# Patient Record
Sex: Female | Born: 1935
Health system: Southern US, Community
[De-identification: ages and names within clinical notes are randomized; demographics above are authoritative.]

## PROBLEM LIST (undated history)

## (undated) DIAGNOSIS — F32A Depression, unspecified: Secondary | ICD-10-CM

## (undated) DIAGNOSIS — E785 Hyperlipidemia, unspecified: Secondary | ICD-10-CM

## (undated) DIAGNOSIS — E039 Hypothyroidism, unspecified: Secondary | ICD-10-CM

## (undated) DIAGNOSIS — I350 Nonrheumatic aortic (valve) stenosis: Secondary | ICD-10-CM

## (undated) DIAGNOSIS — K5790 Diverticulosis of intestine, part unspecified, without perforation or abscess without bleeding: Secondary | ICD-10-CM

## (undated) DIAGNOSIS — M8580 Other specified disorders of bone density and structure, unspecified site: Secondary | ICD-10-CM

## (undated) DIAGNOSIS — I471 Supraventricular tachycardia, unspecified: Secondary | ICD-10-CM

## (undated) DIAGNOSIS — M810 Age-related osteoporosis without current pathological fracture: Secondary | ICD-10-CM

## (undated) DIAGNOSIS — R011 Cardiac murmur, unspecified: Secondary | ICD-10-CM

## (undated) DIAGNOSIS — D126 Benign neoplasm of colon, unspecified: Secondary | ICD-10-CM

## (undated) DIAGNOSIS — F329 Major depressive disorder, single episode, unspecified: Secondary | ICD-10-CM

## (undated) DIAGNOSIS — I10 Essential (primary) hypertension: Secondary | ICD-10-CM

## (undated) DIAGNOSIS — E7800 Pure hypercholesterolemia, unspecified: Secondary | ICD-10-CM

## (undated) HISTORY — DX: Hypothyroidism, unspecified: E03.9

## (undated) HISTORY — DX: Pure hypercholesterolemia, unspecified: E78.00

## (undated) HISTORY — DX: Depression, unspecified: F32.A

## (undated) HISTORY — DX: Hyperlipidemia, unspecified: E78.5

## (undated) HISTORY — DX: Essential (primary) hypertension: I10

## (undated) HISTORY — DX: Other specified disorders of bone density and structure, unspecified site: M85.80

## (undated) HISTORY — DX: Benign neoplasm of colon, unspecified: D12.6

## (undated) HISTORY — DX: Age-related osteoporosis without current pathological fracture: M81.0

## (undated) HISTORY — DX: Nonrheumatic aortic (valve) stenosis: I35.0

## (undated) HISTORY — PX: COLONOSCOPY: SHX174

## (undated) HISTORY — PX: TONSILLECTOMY: SUR1361

## (undated) HISTORY — DX: Cardiac murmur, unspecified: R01.1

## (undated) HISTORY — PX: DILATION AND CURETTAGE OF UTERUS: SHX78

## (undated) HISTORY — DX: Diverticulosis of intestine, part unspecified, without perforation or abscess without bleeding: K57.90

## (undated) HISTORY — DX: Major depressive disorder, single episode, unspecified: F32.9

---
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
## 1994-11-18 HISTORY — PX: CERVICAL BIOPSY  W/ LOOP ELECTRODE EXCISION: SUR135

## 1999-01-02 ENCOUNTER — Other Ambulatory Visit: Admission: RE | Admit: 1999-01-02 | Discharge: 1999-01-02 | Payer: Self-pay | Admitting: Obstetrics and Gynecology

## 2000-01-07 ENCOUNTER — Other Ambulatory Visit: Admission: RE | Admit: 2000-01-07 | Discharge: 2000-01-07 | Payer: Self-pay | Admitting: Obstetrics and Gynecology

## 2001-01-21 ENCOUNTER — Other Ambulatory Visit: Admission: RE | Admit: 2001-01-21 | Discharge: 2001-01-21 | Payer: Self-pay | Admitting: Obstetrics and Gynecology

## 2001-03-05 ENCOUNTER — Encounter: Payer: Self-pay | Admitting: Emergency Medicine

## 2001-03-05 ENCOUNTER — Emergency Department (HOSPITAL_COMMUNITY): Admission: EM | Admit: 2001-03-05 | Discharge: 2001-03-05 | Payer: Self-pay | Admitting: Emergency Medicine

## 2001-04-06 ENCOUNTER — Encounter (INDEPENDENT_AMBULATORY_CARE_PROVIDER_SITE_OTHER): Payer: Self-pay | Admitting: Specialist

## 2001-04-06 ENCOUNTER — Ambulatory Visit (HOSPITAL_COMMUNITY): Admission: RE | Admit: 2001-04-06 | Discharge: 2001-04-06 | Payer: Self-pay | Admitting: Gastroenterology

## 2002-01-22 ENCOUNTER — Other Ambulatory Visit: Admission: RE | Admit: 2002-01-22 | Discharge: 2002-01-22 | Payer: Self-pay | Admitting: Obstetrics and Gynecology

## 2003-03-02 ENCOUNTER — Encounter: Payer: Self-pay | Admitting: Internal Medicine

## 2003-03-02 ENCOUNTER — Encounter: Admission: RE | Admit: 2003-03-02 | Discharge: 2003-03-02 | Payer: Self-pay | Admitting: Internal Medicine

## 2003-03-03 ENCOUNTER — Other Ambulatory Visit: Admission: RE | Admit: 2003-03-03 | Discharge: 2003-03-03 | Payer: Self-pay | Admitting: Obstetrics and Gynecology

## 2004-04-27 ENCOUNTER — Encounter: Admission: RE | Admit: 2004-04-27 | Discharge: 2004-04-27 | Payer: Self-pay | Admitting: Internal Medicine

## 2004-06-06 ENCOUNTER — Other Ambulatory Visit: Admission: RE | Admit: 2004-06-06 | Discharge: 2004-06-06 | Payer: Self-pay | Admitting: Obstetrics and Gynecology

## 2005-05-03 ENCOUNTER — Encounter: Admission: RE | Admit: 2005-05-03 | Discharge: 2005-05-03 | Payer: Self-pay | Admitting: Internal Medicine

## 2005-11-19 ENCOUNTER — Other Ambulatory Visit: Admission: RE | Admit: 2005-11-19 | Discharge: 2005-11-19 | Payer: Self-pay | Admitting: Obstetrics and Gynecology

## 2007-06-17 ENCOUNTER — Encounter: Admission: RE | Admit: 2007-06-17 | Discharge: 2007-06-17 | Payer: Self-pay | Admitting: Internal Medicine

## 2008-01-12 ENCOUNTER — Encounter: Admission: RE | Admit: 2008-01-12 | Discharge: 2008-01-12 | Payer: Self-pay | Admitting: Psychiatry

## 2008-07-18 ENCOUNTER — Encounter: Admission: RE | Admit: 2008-07-18 | Discharge: 2008-07-18 | Payer: Self-pay | Admitting: Internal Medicine

## 2009-11-14 ENCOUNTER — Encounter: Admission: RE | Admit: 2009-11-14 | Discharge: 2009-11-14 | Payer: Self-pay | Admitting: Internal Medicine

## 2010-08-07 ENCOUNTER — Ambulatory Visit: Payer: Self-pay | Admitting: Cardiology

## 2010-08-07 ENCOUNTER — Ambulatory Visit (HOSPITAL_COMMUNITY): Admission: RE | Admit: 2010-08-07 | Discharge: 2010-08-07 | Payer: Self-pay | Admitting: Cardiology

## 2010-08-13 ENCOUNTER — Ambulatory Visit: Payer: Self-pay | Admitting: Cardiology

## 2010-12-11 ENCOUNTER — Other Ambulatory Visit: Payer: Self-pay | Admitting: Internal Medicine

## 2010-12-11 DIAGNOSIS — Z1239 Encounter for other screening for malignant neoplasm of breast: Secondary | ICD-10-CM

## 2010-12-22 ENCOUNTER — Encounter: Payer: Self-pay | Admitting: Cardiology

## 2011-01-01 ENCOUNTER — Ambulatory Visit
Admission: RE | Admit: 2011-01-01 | Discharge: 2011-01-01 | Disposition: A | Discharge status: Home / Self Care | Payer: BC Managed Care – PPO | Source: Ambulatory Visit | Attending: Internal Medicine | Admitting: Internal Medicine

## 2011-01-01 DIAGNOSIS — Z1239 Encounter for other screening for malignant neoplasm of breast: Secondary | ICD-10-CM

## 2011-02-11 ENCOUNTER — Encounter: Payer: Self-pay | Admitting: Cardiology

## 2011-02-11 ENCOUNTER — Ambulatory Visit (INDEPENDENT_AMBULATORY_CARE_PROVIDER_SITE_OTHER): Payer: Medicare Other | Admitting: Cardiology

## 2011-02-11 VITALS — BP 130/70 | HR 68 | Wt 172.0 lb

## 2011-02-11 DIAGNOSIS — I359 Nonrheumatic aortic valve disorder, unspecified: Secondary | ICD-10-CM

## 2011-02-11 DIAGNOSIS — I350 Nonrheumatic aortic (valve) stenosis: Secondary | ICD-10-CM

## 2011-02-11 DIAGNOSIS — E039 Hypothyroidism, unspecified: Secondary | ICD-10-CM

## 2011-02-11 DIAGNOSIS — E7800 Pure hypercholesterolemia, unspecified: Secondary | ICD-10-CM

## 2011-02-11 DIAGNOSIS — I10 Essential (primary) hypertension: Secondary | ICD-10-CM

## 2011-02-11 LAB — LIPID PANEL
HDL: 55 mg/dL (ref >39)
LDL Cholesterol: 97 mg/dL (ref 0–99)
Total CHOL/HDL Ratio: 3.5 Ratio
VLDL: 43 mg/dL — ABNORMAL HIGH (ref 0–40)

## 2011-02-11 LAB — COMPREHENSIVE METABOLIC PANEL
AST: 22 U/L (ref 0–37)
Albumin: 4.6 g/dL (ref 3.5–5.2)
Alkaline Phosphatase: 56 U/L (ref 39–117)
BUN: 19 mg/dL (ref 6–23)
Creat: 0.92 mg/dL (ref 0.40–1.20)
Potassium: 4 mEq/L (ref 3.5–5.3)
Total Bilirubin: 0.6 mg/dL (ref 0.3–1.2)

## 2011-02-11 NOTE — Assessment & Plan Note (Signed)
She has known mild aortic stenosis.  Her last echocardiogram was 08/07/10 which showed an aortic valve area of 1.46.  She's not having any angina pectoris or symptoms of congestive heart failure or exertional dizziness or syncope

## 2011-02-11 NOTE — Assessment & Plan Note (Signed)
The patient is not having any chest pain dizziness or syncope.  She denies any headaches or dizzy spells.  She's not having any adverse reaction from her blood pressure medication.  She will continue same medication.

## 2011-02-11 NOTE — Assessment & Plan Note (Signed)
She is attempting to stay on a low calorie low cholesterol diet.  Her weight is down 2 pounds since last visit.  She stays busy looking after her daughter who has metastatic breast cancer and so she herself does not get much regular intentional aerobic exercise.

## 2011-02-11 NOTE — Progress Notes (Signed)
History of Present Illness: This pleasant 77 year old Caucasian female is seen for a six-month followup office visit.  She does have a past history of essential hypertension and history of mild aortic stenosis.  In any new cardiovascular symptoms.  She has been under a lot of stress because her daughter who has metastatic breast cancer has been doing poorly.  Current Outpatient Prescriptions  Medication Sig Dispense Refill  . aspirin 81 MG tablet Take 81 mg by mouth every other day.        . bisoprolol (ZEBETA) 5 MG tablet Take 5 mg by mouth daily. TAKING ONE HALF DAILY      . Coenzyme Q10 (COQ10) 100 MG capsule Take 100 mg by mouth daily.        . Cyanocobalamin (B-12 PO) Take by mouth.        [name] ibuprofen (ADVIL) 200 MG tablet Take 400 mg by mouth every 6 (six) hours as needed.        [name] levothyroxine (SYNTHROID, LEVOTHROID) 88 MCG tablet Take 88 mcg by mouth daily.        . Loratadine (CLARITIN PO) Take by mouth as needed.        . multivitamin (THERAGRAN) per tablet Take 1 tablet by mouth daily.        . nitroGLYCERIN (NITRODUR) 0.4 mg/hr Place 1 patch onto the skin daily.        . Omeprazole (PRILOSEC PO) Take by mouth daily. TAKES PRN      . Pyridoxine HCl (B-6 PO) Take by mouth.        . triamterene-hydrochlorothiazide (MAXZIDE-25) 37.5-25 MG per tablet Take 1 tablet by mouth daily. 1/2 tab        . valsartan (DIOVAN) 80 MG tablet Take 80 mg by mouth daily. 1/2 tab       . zolpidem (AMBIEN) 10 MG tablet Take 10 mg by mouth at bedtime as needed. 1/2 tab       . Multiple Minerals-Vitamins (CITRACAL PLUS PO) Take by mouth.        . Omega-3 Fatty Acids (SALMON OIL PO) Take by mouth 2 (two) times daily.          Allergies  Allergen Reactions  . Actonel      GI ISSUES  . Altace Cough  . Bextra (Valdecoxib)   . Lipitor (Atorvastatin Calcium)     MYALGIAS  . Zocor (Simvastatin)     MYALGIAS  . Niaspan (Niacin (Antihyperlipidemic)) Rash    Patient Active Problem List  Diagnoses  .  Hypertension  . Hypercholesteremia  . Hypothyroid  . Aortic stenosis, mild    History  Smoking status  . Former Smoker  Smokeless tobacco  . Not on file    History  Alcohol Use     History reviewed. No pertinent family history.  Review of Systems: Constitutional: no fever chills diaphoresis or fatigue or change in weight.  Head and neck: no hearing loss, no epistaxis, no photophobia or visual disturbance. Respiratory: No cough, shortness of breath or wheezing. Cardiovascular: No chest pain peripheral edema, palpitations. Gastrointestinal: No abdominal distention, no abdominal pain, no change in bowel habits hematochezia or melena. Genitourinary: No dysuria, no frequency, no urgency, no nocturia. Musculoskeletal:No arthralgias, no back pain, no gait disturbance or myalgias. Neurological: No dizziness, no headaches, no numbness, no seizures, no syncope, no weakness, no tremors. Hematologic: No lymphadenopathy, no easy bruising. Psychiatric: No confusion, no hallucinations, no sleep disturbance.    Physical Exam: Filed Vitals:  02/11/11 1148  BP: 130/70  Pulse: 68   Her weight is 172, down 2 pounds.  The general appearance reveals a well-developed well-nourished woman in no distress.Pupils equal and reactive.   Extraocular Movements are full.  There is no scleral icterus.  The mouth and pharynx are normal.  The neck is supple.  The carotids reveal no bruits.  The jugular venous pressure is normal.  The thyroid is not enlarged.  There is no lymphadenopathy.The chest is clear to percussion and auscultation. There are no rales or rhonchi. Expansion of the chest is symmetrical.The precordium is quiet.  The first heart sound is normal.  The second heart sound is physiologically split.  There is no  gallop rub or click.  There is no abnormal lift or heave.There is a grade 2/6 systolic ejection murmur at the base.The abdomen is soft and nontender. Bowel sounds are normal. The liver and  spleen are not enlarged. There Are no abdominal masses. There are no bruits.The pedal pulses are good.  There is no phlebitis or edema.  There is no cyanosis or clubbing.Strength is normal and symmetrical in all extremities.  There is no lateralizing weakness.  There are no sensory deficits.The skin is warm and dry.  There is no rash.  Assessment / Plan:

## 2011-02-12 ENCOUNTER — Encounter: Payer: Self-pay | Admitting: *Deleted

## 2011-02-25 ENCOUNTER — Other Ambulatory Visit: Payer: Self-pay | Admitting: *Deleted

## 2011-02-25 DIAGNOSIS — I10 Essential (primary) hypertension: Secondary | ICD-10-CM

## 2011-02-25 MED ORDER — BISOPROLOL FUMARATE 5 MG PO TABS: 5.0000 mg | ORAL_TABLET | Freq: Every day | ORAL | Status: DC

## 2011-04-05 NOTE — Procedures (Signed)
[place]  Patient:    Kim Ward, Kim Ward                         MRN: [id number] Proc. Date: 04/06/01 Adm. Date:  [id number] Attending:  Rich Brave CC:         Soyla Murphy. Renne Crigler, M.D.   Procedure Report  PROCEDURE:  Colonoscopy with biopsies.  INDICATIONS:  Screening for colon cancer in a 77 year old female.  FINDINGS:  Scattered diverticulosis.  Diminutive polyp in the proximal colon.  PROCEDURE:  The nature, purpose, and risks of the procedure had been discussed with the patient, who provided written consent.  Sedation was fentanyl 80 mcg and Versed 8 mg IV prior to and during the course of the procedure without arrhythmias or desaturation.  Digital examination was unremarkable.  The Olympus adjustable-tension pediatric video colonoscope was advanced with some difficulty through a fixated sigmoid region with a fair amount of angulation, and from there around the colon to the cecum as identified by visualization of the ileocecal valve and the absence of further lumen. Pullback was then performed.  The quality of the prep was excellent and it was felt that essentially all areas were well seen, although the fixation of the colon may have caused some "blind spots" at turns.  There was a 2 mm sessile polyp removed by a single cold biopsy just a short distance above the cecum.  No other polyps were seen and there was no evidence of colitis or vascular malformations or cancer.  There was minimal left-sided diverticulosis.  Retroflexion in the rectum was unremarkable.  The patient tolerated this procedure well and there were no apparent complications.  IMPRESSION:  Diminutive colonic polyp.  Minimal diverticulosis.  PLAN:  Await pathology.  Anticipate follow-up sigmoidoscopy in five years unless the polyp is adenomatous in character, in which care, full colonoscopy in five years would be appropriate. DD:  04/06/01 TD:   04/06/01 Job: [id number]

## 2011-05-02 ENCOUNTER — Encounter: Payer: Self-pay | Admitting: Cardiology

## 2011-08-27 ENCOUNTER — Ambulatory Visit (INDEPENDENT_AMBULATORY_CARE_PROVIDER_SITE_OTHER): Payer: Medicare Other | Admitting: *Deleted

## 2011-08-27 DIAGNOSIS — E7800 Pure hypercholesterolemia, unspecified: Secondary | ICD-10-CM

## 2011-08-27 LAB — HEPATIC FUNCTION PANEL
ALT: 25 U/L (ref 0–35)
AST: 28 U/L (ref 0–37)
Total Protein: 7.3 g/dL (ref 6.0–8.3)

## 2011-08-27 LAB — BASIC METABOLIC PANEL
BUN: 19 mg/dL (ref 6–23)
CO2: 26 mEq/L (ref 19–32)
Chloride: 106 mEq/L (ref 96–112)
Creatinine, Ser: 1 mg/dL (ref 0.4–1.2)
Glucose, Bld: 95 mg/dL (ref 70–99)

## 2011-08-27 LAB — LDL CHOLESTEROL, DIRECT: Direct LDL: 94.8 mg/dL

## 2011-08-27 LAB — LIPID PANEL: Cholesterol: 185 mg/dL (ref 0–200)

## 2011-09-02 ENCOUNTER — Ambulatory Visit (INDEPENDENT_AMBULATORY_CARE_PROVIDER_SITE_OTHER): Payer: Medicare Other | Admitting: Cardiology

## 2011-09-02 ENCOUNTER — Encounter: Payer: Self-pay | Admitting: Cardiology

## 2011-09-02 VITALS — BP 130/68 | HR 60 | Ht 65.0 in | Wt 172.0 lb

## 2011-09-02 DIAGNOSIS — I350 Nonrheumatic aortic (valve) stenosis: Secondary | ICD-10-CM

## 2011-09-02 DIAGNOSIS — I119 Hypertensive heart disease without heart failure: Secondary | ICD-10-CM

## 2011-09-02 DIAGNOSIS — E7800 Pure hypercholesterolemia, unspecified: Secondary | ICD-10-CM

## 2011-09-02 DIAGNOSIS — I10 Essential (primary) hypertension: Secondary | ICD-10-CM

## 2011-09-02 DIAGNOSIS — I359 Nonrheumatic aortic valve disorder, unspecified: Secondary | ICD-10-CM

## 2011-09-02 NOTE — Assessment & Plan Note (Signed)
Patient has been feeling well.  She's not having any headaches or dizzy spells referable to her high blood pressure.  She's had no syncope.  He has not been aware of any palpitations.

## 2011-09-02 NOTE — Patient Instructions (Signed)
Increase exercise   continue same dose of medications  Your physician wants you to follow-up in:6 months You will receive a reminder letter in the mail two months in advance. If you don't receive a letter, please call our office to schedule the follow-up appointment.

## 2011-09-02 NOTE — Progress Notes (Signed)
Kim Ward Date of Birth:  04-May-1931 [facility] [address]   [address] [place], [place]  [address] [phone]           Fax   [phone]  History of Present Illness: This pleasant 77 year old woman is seen for a six-month followup office visit.  She has a past history of essential hypertension, and a history of mild aortic stenosis.  She's had a streak of increased stress because of her daughter, who has had metastatic breast cancer and has not been doing well.  Current Outpatient Prescriptions  Medication Sig Dispense Refill  . aspirin 81 MG tablet Take 81 mg by mouth every other day.        . bisoprolol (ZEBETA) 5 MG tablet Take 1 tablet (5 mg total) by mouth daily. TAKING ONE HALF DAILY  90 tablet  3  . Coenzyme Q10 (COQ10) 100 MG capsule Take 100 mg by mouth daily.        . Cyanocobalamin (B-12 PO) Take by mouth.        [name] ibuprofen (ADVIL) 200 MG tablet Take 400 mg by mouth every 6 (six) hours as needed.        [name] levothyroxine (SYNTHROID, LEVOTHROID) 88 MCG tablet Take 75 mcg by mouth daily. 6 days a week      . Loratadine (CLARITIN PO) Take by mouth as needed.        . Multiple Minerals-Vitamins (CITRACAL PLUS PO) Take by mouth.        . multivitamin (THERAGRAN) per tablet Take 1 tablet by mouth daily.        . nitroGLYCERIN (NITRODUR) 0.4 mg/hr Place 1 patch onto the skin daily.        . Omeprazole (PRILOSEC PO) Take by mouth daily. TAKES PRN      . Pyridoxine HCl (B-6 PO) Take by mouth.        . rosuvastatin (CRESTOR) 5 MG tablet Take 5 mg by mouth daily. Alt. 1/2 then one       . triamterene-hydrochlorothiazide (MAXZIDE-25) 37.5-25 MG per tablet Take 1 tablet by mouth daily. 1/2 tab        . valsartan (DIOVAN) 80 MG tablet Take 80 mg by mouth daily. 1/2 tab       . zolpidem (AMBIEN) 10 MG tablet Take 10 mg by mouth at bedtime as needed. 1/2 tab         Allergies  Allergen Reactions  . Actonel      GI ISSUES  . Altace Cough  . Bextra  (Valdecoxib)   . Lipitor (Atorvastatin Calcium)     MYALGIAS  . Zocor (Simvastatin)     MYALGIAS  . Niaspan (Niacin (Antihyperlipidemic)) Rash    Patient Active Problem List  Diagnoses  . Hypertension  . Hypercholesteremia  . Hypothyroid  . Aortic stenosis, mild    History  Smoking status  . Former Smoker  Smokeless tobacco  . Not on file    History  Alcohol Use     No family history on file.  Review of Systems: Constitutional: no fever chills diaphoresis or fatigue or change in weight.  Head and neck: no hearing loss, no epistaxis, no photophobia or visual disturbance. Respiratory: No cough, shortness of breath or wheezing. Cardiovascular: No chest pain peripheral edema, palpitations. Gastrointestinal: No abdominal distention, no abdominal pain, no change in bowel habits hematochezia or melena. Genitourinary: No dysuria, no frequency, no urgency, no nocturia. Musculoskeletal:No arthralgias, no back pain,  no gait disturbance or myalgias. Neurological: No dizziness, no headaches, no numbness, no seizures, no syncope, no weakness, no tremors. Hematologic: No lymphadenopathy, no easy bruising. Psychiatric: No confusion, no hallucinations, no sleep disturbance.    Physical Exam: Filed Vitals:   09/02/11 1409  BP: 130/68  Pulse: 60   Gen. appearance reveals a well-developed, well-nourished woman in no distress.The head and neck exam reveals pupils equal and reactive.  Extraocular movements are full.  There is no scleral icterus.  The mouth and pharynx are normal.  The neck is supple.  The carotids reveal no bruits.  The jugular venous pressure is normal.  The  thyroid is not enlarged.  There is no lymphadenopathy.  The chest is clear to percussion and auscultation.  There are no rales or rhonchi.  Expansion of the chest is symmetrical.  The precordium is quiet.  The first heart sound is normal.  The second heart sound is physiologically split.  There is no murmur gallop  rub or click.  There is no abnormal lift or heave.  The abdomen is soft and nontender.  The bowel sounds are normal.  The liver and spleen are not enlarged.  There are no abdominal masses.  There are no abdominal bruits.  Extremities reveal good pedal pulses.  There is no phlebitis or edema.  There is no cyanosis or clubbing.  Strength is normal and symmetrical in all extremities.  There is no lateralizing weakness.  There are no sensory deficits.  The skin is warm and dry.  There is no rash.    Assessment / Plan:  Continue same medication.  Recheck in 6 months for followup office visit fasting lab work and EKG

## 2011-09-02 NOTE — Assessment & Plan Note (Signed)
The patient has a history of a systolic ejection murmur at the base, consistent with mild aortic stenosis.  Her last echocardiogram was a year ago on 08/07/10 and showed normal left systolic diastolic function and mild to moderate aortic stenosis with a peak gradient of 24 and a mean gradient of 14.  The patient does not have any history of ischemic heart disease.  She had a normal nuclear stress test on 02/24/2008

## 2011-09-02 NOTE — Assessment & Plan Note (Signed)
Patient has a history of hypercholesterolemia.  She is on Crestor.  She is not having side effects from the statin therapy.  We reviewed her recent labs which are improved

## 2011-12-31 ENCOUNTER — Other Ambulatory Visit: Payer: Self-pay | Admitting: Cardiology

## 2011-12-31 ENCOUNTER — Encounter: Payer: Self-pay | Admitting: Cardiology

## 2011-12-31 ENCOUNTER — Ambulatory Visit (INDEPENDENT_AMBULATORY_CARE_PROVIDER_SITE_OTHER): Payer: Medicare Other | Admitting: Cardiology

## 2011-12-31 VITALS — BP 132/66 | HR 76 | Ht 65.0 in | Wt 175.1 lb

## 2011-12-31 DIAGNOSIS — I119 Hypertensive heart disease without heart failure: Secondary | ICD-10-CM

## 2011-12-31 DIAGNOSIS — I471 Supraventricular tachycardia: Secondary | ICD-10-CM | POA: Insufficient documentation

## 2011-12-31 DIAGNOSIS — E7800 Pure hypercholesterolemia, unspecified: Secondary | ICD-10-CM

## 2011-12-31 DIAGNOSIS — E039 Hypothyroidism, unspecified: Secondary | ICD-10-CM

## 2011-12-31 DIAGNOSIS — I350 Nonrheumatic aortic (valve) stenosis: Secondary | ICD-10-CM

## 2011-12-31 DIAGNOSIS — I359 Nonrheumatic aortic valve disorder, unspecified: Secondary | ICD-10-CM

## 2011-12-31 DIAGNOSIS — R002 Palpitations: Secondary | ICD-10-CM

## 2011-12-31 NOTE — Assessment & Plan Note (Signed)
The patient has a remote history of palpitations but has not had any recently.  She could not point to any specific thing which caused the palpitations yesterday morning.  She was under no more stress than usual.  She does avoid caffeine.  She does not drink alcohol.  Since yesterday morning she's had no recurrence of the palpitations and she feels well today.  Electrocardiogram today shows normal sinus rhythm and is within normal limits.

## 2011-12-31 NOTE — Assessment & Plan Note (Signed)
The patient is on thyroid replacement therapy and is clinically euthyroid.

## 2011-12-31 NOTE — Patient Instructions (Signed)
Take an extra 1/2 Bisoprolol right away if rapid heart rate should come back Your physician recommends that you continue on your current medications as directed. Please refer to the Current Medication list given to you today. Keep April appointment

## 2011-12-31 NOTE — Progress Notes (Signed)
Kim Ward Date of Birth:  07-02-1931 [facility] [address] [place], [place]  [address] [phone]         Fax   [phone]  History of Present Illness: This pleasant 78 year old woman is seen as a work in the office visit.  Yesterday morning she called 911 because of sudden rapid tachypalpitations.  The EMS personnel came to her home.  Her blood pressure initially was 180/100 and her pulse by that time had slowed down and she did not have to go to the emergency room.  When she first called 911 they instructed her to take 4 baby aspirins which she did.  Current Outpatient Prescriptions  Medication Sig Dispense Refill  . aspirin 81 MG tablet Take 81 mg by mouth every other day.        . bisoprolol (ZEBETA) 5 MG tablet Take 1 tablet (5 mg total) by mouth daily. TAKING ONE HALF DAILY  90 tablet  3  . Coenzyme Q10 (COQ10) 100 MG capsule Take 100 mg by mouth daily.        . Cyanocobalamin (B-12 PO) Take by mouth.        [name] ibuprofen (ADVIL) 200 MG tablet Take 400 mg by mouth every 6 (six) hours as needed.        [name] levothyroxine (SYNTHROID, LEVOTHROID) 88 MCG tablet Take 75 mcg by mouth daily. 6 days a week      . Loratadine (CLARITIN PO) Take by mouth as needed.        . Multiple Minerals-Vitamins (CITRACAL PLUS PO) Take by mouth.        . multivitamin (THERAGRAN) per tablet Take 1 tablet by mouth daily.        . Omeprazole (PRILOSEC PO) Take by mouth daily. TAKES PRN      . Pyridoxine HCl (B-6 PO) Take by mouth.        . rosuvastatin (CRESTOR) 5 MG tablet Take 5 mg by mouth daily. Alt. 1/2 then one       . triamterene-hydrochlorothiazide (MAXZIDE-25) 37.5-25 MG per tablet Take 1 tablet by mouth daily. 1/2 tab        . valsartan (DIOVAN) 80 MG tablet Take 80 mg by mouth daily. 1/2 tab       . zolpidem (AMBIEN) 10 MG tablet Take 10 mg by mouth at bedtime as needed. 1/2 tab       . nitroGLYCERIN (NITROSTAT) 0.4 MG SL tablet TAKE 1 TABLET UNDER TONGUE AS NEEDED.  25  tablet  11    Allergies  Allergen Reactions  . Actonel      GI ISSUES  . Altace Cough  . Bextra (Valdecoxib)   . Lipitor (Atorvastatin Calcium)     MYALGIAS  . Zocor (Simvastatin)     MYALGIAS  . Niaspan (Niacin (Antihyperlipidemic)) Rash    Patient Active Problem List  Diagnoses  . Hypertension  . Hypercholesteremia  . Hypothyroid  . Aortic stenosis, mild    History  Smoking status  . Former Smoker  Smokeless tobacco  . Not on file    History  Alcohol Use     No family history on file.  Review of Systems: Constitutional: no fever chills diaphoresis or fatigue or change in weight.  Head and neck: no hearing loss, no epistaxis, no photophobia or visual disturbance. Respiratory: No cough, shortness of breath or wheezing. Cardiovascular: No chest pain peripheral edema, palpitations. Gastrointestinal: No abdominal distention, no abdominal pain, no change in bowel  habits hematochezia or melena. Genitourinary: No dysuria, no frequency, no urgency, no nocturia. Musculoskeletal:No arthralgias, no back pain, no gait disturbance or myalgias. Neurological: No dizziness, no headaches, no numbness, no seizures, no syncope, no weakness, no tremors. Hematologic: No lymphadenopathy, no easy bruising. Psychiatric: No confusion, no hallucinations, no sleep disturbance.    Physical Exam: Filed Vitals:   12/31/11 0946  BP: 132/66  Pulse: 76   the general appearance reveals a well-developed well-nourished woman in no distress.Pupils equal and reactive.   Extraocular Movements are full.  There is no scleral icterus.  The mouth and pharynx are normal.  The neck is supple.  The carotids reveal no bruits.  The jugular venous pressure is normal.  The thyroid is not enlarged.  There is no lymphadenopathy.  The chest is clear to percussion and auscultation. There are no rales or rhonchi. Expansion of the chest is symmetrical.  Heart reveals a grade 2/6 systolic ejection murmur at the  base.The abdomen is soft and nontender. Bowel sounds are normal. The liver and spleen are not enlarged. There Are no abdominal masses. There are no bruits.  The pedal pulses are good.  There is no phlebitis or edema.  There is no cyanosis or clubbing. Strength is normal and symmetrical in all extremities.  There is no lateralizing weakness.  There are no sensory deficits.  The skin is warm and dry.  There is no rash.  EKG shows normal sinus rhythm and is within normal limits   Assessment / Plan: The patient is to continue same medication.  If she has a recurrence of her tachycardia palpitations she will take an extra half tablet of bisoprolol stat.  She will also try to reduce the stress in her life.  She is very busy looking after her daughter who has serious health problems and lives in [place].  She'll be rechecked here at her regular visit in April

## 2011-12-31 NOTE — Assessment & Plan Note (Signed)
The patient has a history of known mild aortic stenosis which is asymptomatic

## 2012-01-21 ENCOUNTER — Other Ambulatory Visit: Payer: Self-pay | Admitting: Internal Medicine

## 2012-01-21 DIAGNOSIS — Z1231 Encounter for screening mammogram for malignant neoplasm of breast: Secondary | ICD-10-CM

## 2012-02-05 ENCOUNTER — Ambulatory Visit
Admission: RE | Admit: 2012-02-05 | Discharge: 2012-02-05 | Disposition: A | Discharge status: Home / Self Care | Payer: Medicare Other | Source: Ambulatory Visit | Attending: Internal Medicine | Admitting: Internal Medicine

## 2012-02-05 DIAGNOSIS — Z1231 Encounter for screening mammogram for malignant neoplasm of breast: Secondary | ICD-10-CM

## 2012-02-10 ENCOUNTER — Other Ambulatory Visit (INDEPENDENT_AMBULATORY_CARE_PROVIDER_SITE_OTHER): Payer: Medicare Other

## 2012-02-10 DIAGNOSIS — E7800 Pure hypercholesterolemia, unspecified: Secondary | ICD-10-CM

## 2012-02-10 LAB — BASIC METABOLIC PANEL
Calcium: 9.3 mg/dL (ref 8.4–10.5)
Chloride: 107 mEq/L (ref 96–112)
Creatinine, Ser: 0.9 mg/dL (ref 0.4–1.2)
Sodium: 139 mEq/L (ref 135–145)

## 2012-02-10 LAB — HEPATIC FUNCTION PANEL
Albumin: 3.9 g/dL (ref 3.5–5.2)
Alkaline Phosphatase: 56 U/L (ref 39–117)
Total Protein: 7 g/dL (ref 6.0–8.3)

## 2012-02-10 LAB — LIPID PANEL
Cholesterol: 207 mg/dL — ABNORMAL HIGH (ref 0–200)
HDL: 49.8 mg/dL (ref >39.00)
Total CHOL/HDL Ratio: 4
Triglycerides: 269 mg/dL — ABNORMAL HIGH (ref 0.0–149.0)
VLDL: 53.8 mg/dL — ABNORMAL HIGH (ref 0.0–40.0)

## 2012-02-10 LAB — LDL CHOLESTEROL, DIRECT: Direct LDL: 120.8 mg/dL

## 2012-02-10 NOTE — Progress Notes (Signed)
Quick Note:  Please make copy of labs for patient visit. ______ 

## 2012-02-17 ENCOUNTER — Ambulatory Visit (INDEPENDENT_AMBULATORY_CARE_PROVIDER_SITE_OTHER): Payer: Medicare Other | Admitting: Cardiology

## 2012-02-17 ENCOUNTER — Encounter: Payer: Self-pay | Admitting: Cardiology

## 2012-02-17 VITALS — BP 122/70 | HR 71 | Ht 65.0 in | Wt 179.4 lb

## 2012-02-17 DIAGNOSIS — I119 Hypertensive heart disease without heart failure: Secondary | ICD-10-CM

## 2012-02-17 DIAGNOSIS — I359 Nonrheumatic aortic valve disorder, unspecified: Secondary | ICD-10-CM

## 2012-02-17 DIAGNOSIS — I10 Essential (primary) hypertension: Secondary | ICD-10-CM

## 2012-02-17 DIAGNOSIS — I350 Nonrheumatic aortic (valve) stenosis: Secondary | ICD-10-CM

## 2012-02-17 DIAGNOSIS — E7800 Pure hypercholesterolemia, unspecified: Secondary | ICD-10-CM

## 2012-02-17 NOTE — Patient Instructions (Addendum)
Your physician recommends that you schedule a follow-up appointment in: 6 months, the office will mail you a reminder letter 2 months prior appointment date. Your physician recommends that you return for lab work in:  Fasting lipid panel,LFT, and BMET with the 6 monthd F/U visit. Your physician recommends for you to lose weight , and increase activity specially walking. Your physician recommends that you continue on your current medications as directed. Please refer to the Current Medication list given to you today.

## 2012-02-17 NOTE — Assessment & Plan Note (Signed)
She has known mild aortic stenosis.  She's not having any cardinal symptoms from her valvular heart disease

## 2012-02-17 NOTE — Assessment & Plan Note (Signed)
Blood pressure has been remaining stable on current therapy.  No headaches or dizzy spells 

## 2012-02-17 NOTE — Progress Notes (Signed)
Kim Ward Date of Birth:  [birth year] [name] [address] [place], [place]  [address] [phone]         Fax   [phone]  History of Present Illness: This 78 year old widowed Caucasian female is seen for a scheduled followup office visit.  He has a past history of essential hypertension and a history of mild aortic stenosis.  She also has a history of hypercholesterolemia.  We last saw her in February 2012 after she had an episode of rapid tachycardia palpitations at home.  EMS was called but she did not have to go to the emergency room.  We saw her the following day at which time her vital signs were stable and she was continued on same medication.  She has had no recurrence of the SVT  Current Outpatient Prescriptions  Medication Sig Dispense Refill  . aspirin 81 MG tablet Take 81 mg by mouth every other day.        . bisoprolol (ZEBETA) 5 MG tablet Take 1 tablet (5 mg total) by mouth daily. TAKING ONE HALF DAILY  90 tablet  3  . Coenzyme Q10 (COQ10) 100 MG capsule Take 100 mg by mouth daily.        . Cyanocobalamin (B-12 PO) Take by mouth.        [name] ibuprofen (ADVIL) 200 MG tablet Take 400 mg by mouth every 6 (six) hours as needed.        [name] levothyroxine (SYNTHROID, LEVOTHROID) 88 MCG tablet Take 75 mcg by mouth daily. 6 days a week      . Loratadine (CLARITIN PO) Take by mouth as needed.        . Multiple Minerals-Vitamins (CITRACAL PLUS PO) Take by mouth.        . multivitamin (THERAGRAN) per tablet Take 1 tablet by mouth daily.        . nitroGLYCERIN (NITROSTAT) 0.4 MG SL tablet TAKE 1 TABLET UNDER TONGUE AS NEEDED.  25 tablet  11  . Omeprazole (PRILOSEC PO) Take by mouth daily. TAKES PRN      . Pyridoxine HCl (B-6 PO) Take by mouth.        . rosuvastatin (CRESTOR) 5 MG tablet Take 5 mg by mouth daily. Alt. 1/2 then one       . triamterene-hydrochlorothiazide (MAXZIDE-25) 37.5-25 MG per tablet Take 1 tablet by mouth daily. 1/2 tab        . valsartan  (DIOVAN) 80 MG tablet Take 80 mg by mouth daily. 1/2 tab       . zolpidem (AMBIEN) 10 MG tablet Take 10 mg by mouth at bedtime as needed. 1/2 tab         Allergies  Allergen Reactions  . Actonel      GI ISSUES  . Altace Cough  . Bextra (Valdecoxib)   . Lipitor (Atorvastatin Calcium)     MYALGIAS  . Zocor (Simvastatin)     MYALGIAS  . Niaspan (Niacin (Antihyperlipidemic)) Rash    Patient Active Problem List  Diagnoses  . Hypertension  . Hypercholesteremia  . Hypothyroid  . Aortic stenosis, mild  . Rapid palpitations    History  Smoking status  . Former Smoker  Smokeless tobacco  . Not on file    History  Alcohol Use     No family history on file.  Review of Systems: Constitutional: no fever chills diaphoresis or fatigue or change in weight.  Head and neck: no hearing loss, no epistaxis, no  photophobia or visual disturbance. Respiratory: No cough, shortness of breath or wheezing. Cardiovascular: No chest pain peripheral edema, palpitations. Gastrointestinal: No abdominal distention, no abdominal pain, no change in bowel habits hematochezia or melena. Genitourinary: No dysuria, no frequency, no urgency, no nocturia. Musculoskeletal:No arthralgias, no back pain, no gait disturbance or myalgias. Neurological: No dizziness, no headaches, no numbness, no seizures, no syncope, no weakness, no tremors. Hematologic: No lymphadenopathy, no easy bruising. Psychiatric: No confusion, no hallucinations, no sleep disturbance.    Physical Exam: Filed Vitals:   02/17/12 0951  BP: 122/70  Pulse:    the general appearance reveals a well-developed well-nourished woman in no distress.The head and neck exam reveals pupils equal and reactive.  Extraocular movements are full.  There is no scleral icterus.  The mouth and pharynx are normal.  The neck is supple.  The carotids reveal no bruits.  The jugular venous pressure is normal.  The  thyroid is not enlarged.  There is no  lymphadenopathy.  The chest is clear to percussion and auscultation.  There are no rales or rhonchi.  Expansion of the chest is symmetrical.  The precordium is quiet.  The first heart sound is normal.  The second heart sound is physiologically split.  There is no murmur gallop rub or click.  There is a soft systolic ejection murmur at the aortic area.  No diastolic murmur. There is no abnormal lift or heave.  The abdomen is soft and nontender.  The bowel sounds are normal.  The liver and spleen are not enlarged.  There are no abdominal masses.  There are no abdominal bruits.  Extremities reveal good pedal pulses.  There is no phlebitis or edema.  There is no cyanosis or clubbing.  Strength is normal and symmetrical in all extremities.  There is no lateralizing weakness.  There are no sensory deficits.  The skin is warm and dry.  There is no rash.     Assessment / Plan: Continue same medication.  Work harder on diet and exercise.  Recheck in 6 months for followup office visit and fasting lab work.

## 2012-02-17 NOTE — Assessment & Plan Note (Signed)
The patient has gained 4 pounds since last visit.  She has been eating lunch is with friends are good deal since last visit.  Her lipids are higher and we reviewed those today.  We will continue same medication and she will work harder on diet and exercise.

## 2012-03-02 ENCOUNTER — Other Ambulatory Visit: Payer: Self-pay | Admitting: *Deleted

## 2012-03-02 MED ORDER — BISOPROLOL FUMARATE 5 MG PO TABS: ORAL_TABLET | ORAL | Status: DC

## 2012-03-02 NOTE — Telephone Encounter (Signed)
Refilled bisoprolol

## 2012-09-16 ENCOUNTER — Other Ambulatory Visit: Payer: Medicare Other

## 2012-09-18 ENCOUNTER — Other Ambulatory Visit (INDEPENDENT_AMBULATORY_CARE_PROVIDER_SITE_OTHER): Payer: Medicare Other

## 2012-09-18 DIAGNOSIS — E7800 Pure hypercholesterolemia, unspecified: Secondary | ICD-10-CM

## 2012-09-18 DIAGNOSIS — I359 Nonrheumatic aortic valve disorder, unspecified: Secondary | ICD-10-CM

## 2012-09-18 DIAGNOSIS — I350 Nonrheumatic aortic (valve) stenosis: Secondary | ICD-10-CM

## 2012-09-18 DIAGNOSIS — I119 Hypertensive heart disease without heart failure: Secondary | ICD-10-CM

## 2012-09-18 LAB — LIPID PANEL
Cholesterol: 199 mg/dL (ref 0–200)
Triglycerides: 211 mg/dL — ABNORMAL HIGH (ref 0.0–149.0)
VLDL: 42.2 mg/dL — ABNORMAL HIGH (ref 0.0–40.0)

## 2012-09-18 LAB — HEPATIC FUNCTION PANEL
ALT: 28 U/L (ref 0–35)
Total Protein: 6.9 g/dL (ref 6.0–8.3)

## 2012-09-18 LAB — LDL CHOLESTEROL, DIRECT: Direct LDL: 121.6 mg/dL

## 2012-09-18 LAB — BASIC METABOLIC PANEL
BUN: 23 mg/dL (ref 6–23)
CO2: 25 mEq/L (ref 19–32)
Calcium: 8.9 mg/dL (ref 8.4–10.5)
Chloride: 103 mEq/L (ref 96–112)
Creatinine, Ser: 0.9 mg/dL (ref 0.4–1.2)

## 2012-09-18 NOTE — Progress Notes (Signed)
Quick Note:  Please make copy of labs for patient visit. ______ 

## 2012-09-21 ENCOUNTER — Ambulatory Visit (INDEPENDENT_AMBULATORY_CARE_PROVIDER_SITE_OTHER): Payer: Medicare Other | Admitting: Cardiology

## 2012-09-21 ENCOUNTER — Encounter: Payer: Self-pay | Admitting: Cardiology

## 2012-09-21 VITALS — BP 128/72 | HR 64 | Ht 65.5 in | Wt 180.0 lb

## 2012-09-21 DIAGNOSIS — I10 Essential (primary) hypertension: Secondary | ICD-10-CM

## 2012-09-21 DIAGNOSIS — I359 Nonrheumatic aortic valve disorder, unspecified: Secondary | ICD-10-CM

## 2012-09-21 DIAGNOSIS — I350 Nonrheumatic aortic (valve) stenosis: Secondary | ICD-10-CM

## 2012-09-21 DIAGNOSIS — E7800 Pure hypercholesterolemia, unspecified: Secondary | ICD-10-CM

## 2012-09-21 DIAGNOSIS — I119 Hypertensive heart disease without heart failure: Secondary | ICD-10-CM

## 2012-09-21 MED ORDER — DILTIAZEM HCL ER COATED BEADS 120 MG PO CP24: 120.0000 mg | ORAL_CAPSULE | Freq: Every day | ORAL | Status: DC

## 2012-09-21 NOTE — Assessment & Plan Note (Signed)
Her blood pressure has been remaining stable on current therapy.  No symptoms of congestive heart failure

## 2012-09-21 NOTE — Assessment & Plan Note (Signed)
Her last echocardiogram 2 years ago showed mild to moderate aortic stenosis.  She has not been having a cardinal symptoms of aortic stenosis other than for some shortness of breath with extreme activity.  We will update her echo.

## 2012-09-21 NOTE — Progress Notes (Signed)
Kim Ward Date of Birth:  1931-10-08 [facility] [address] [place], [place]  [address] [phone]         Fax   [phone]  History of Present Illness: This 78 year old widowed Caucasian female is seen for a scheduled followup office visit. He has a past history of essential hypertension and a history of mild aortic stenosis. She also has a history of hypercholesterolemia. We last saw her in February 2012 after she had an episode of rapid tachycardia palpitations at home. EMS was called but she did not have to go to the emergency room. We saw her the following day at which time her vital signs were stable and she was continued on same medication. She has had no recurrence of the SVT.  She has had problems with losing her hair.  She has been on a beta blocker.  She would like to try something different.  She has not had any recurrence of her tachycardia palpitations.  She has a history of mild aortic stenosis and has shortness of breath with extreme activity.   Current Outpatient Prescriptions  Medication Sig Dispense Refill  . aspirin 81 MG tablet Take 81 mg by mouth every other day.        [name] BIOTIN PO Take by mouth.      . Coenzyme Q10 (COQ10) 100 MG capsule Take 100 mg by mouth daily.        . Cyanocobalamin (B-12 PO) Take by mouth.        [name] ibuprofen (ADVIL) 200 MG tablet Take 400 mg by mouth every 6 (six) hours as needed.        [name] levothyroxine (SYNTHROID, LEVOTHROID) 88 MCG tablet Take 75 mcg by mouth daily. 6 days a week      . Loratadine (CLARITIN PO) Take by mouth as needed.        . Multiple Minerals-Vitamins (CITRACAL PLUS PO) Take by mouth.        . multivitamin (THERAGRAN) per tablet Take 1 tablet by mouth daily.        . nitroGLYCERIN (NITROSTAT) 0.4 MG SL tablet TAKE 1 TABLET UNDER TONGUE AS NEEDED.  25 tablet  11  . Omeprazole (PRILOSEC PO) Take by mouth daily. TAKES PRN      . Pyridoxine HCl (B-6 PO) Take by mouth.        . rosuvastatin  (CRESTOR) 5 MG tablet Take 5 mg by mouth daily. Alt. 1/2 then one       . triamterene-hydrochlorothiazide (MAXZIDE-25) 37.5-25 MG per tablet Take 1 tablet by mouth daily. 1/2 tab        . valsartan (DIOVAN) 80 MG tablet Take 80 mg by mouth daily. 1/2 tab       . zolpidem (AMBIEN) 10 MG tablet Take 10 mg by mouth at bedtime as needed. 1/2 tab       . diltiazem (CARDIZEM CD) 120 MG 24 hr capsule Take 1 capsule (120 mg total) by mouth daily.  90 capsule  3    Allergies  Allergen Reactions  . Bextra (Valdecoxib)   . Lipitor (Atorvastatin Calcium)     MYALGIAS  . Ramipril Cough  . Risedronate Sodium      GI ISSUES  . Zocor (Simvastatin)     MYALGIAS  . Niaspan (Niacin Er) Rash    Patient Active Problem List  Diagnosis  . Hypertension  . Hypercholesteremia  . Hypothyroid  . Aortic stenosis, mild  . Rapid palpitations  History  Smoking status  . Former Smoker  Smokeless tobacco  . Not on file    History  Alcohol Use     No family history on file.  Review of Systems: Constitutional: no fever chills diaphoresis or fatigue or change in weight.  Head and neck: no hearing loss, no epistaxis, no photophobia or visual disturbance. Respiratory: No cough, shortness of breath or wheezing. Cardiovascular: No chest pain peripheral edema, palpitations. Gastrointestinal: No abdominal distention, no abdominal pain, no change in bowel habits hematochezia or melena. Genitourinary: No dysuria, no frequency, no urgency, no nocturia. Musculoskeletal:No arthralgias, no back pain, no gait disturbance or myalgias. Neurological: No dizziness, no headaches, no numbness, no seizures, no syncope, no weakness, no tremors. Hematologic: No lymphadenopathy, no easy bruising. Psychiatric: No confusion, no hallucinations, no sleep disturbance.    Physical Exam: Filed Vitals:   09/21/12 1352  BP: 128/72  Pulse: 64   the general appearance reveals a well-developed well-nourished woman in no  distress.The head and neck exam reveals pupils equal and reactive.  Extraocular movements are full.  There is no scleral icterus.  The mouth and pharynx are normal.  The neck is supple.  The carotids reveal no bruits.  The jugular venous pressure is normal.  The  thyroid is not enlarged.  There is no lymphadenopathy.  The chest is clear to percussion and auscultation.  There are no rales or rhonchi.  Expansion of the chest is symmetrical.  The precordium is quiet.  The first heart sound is normal.  The second heart sound is physiologically split.  There is a soft systolic ejection murmur at the base. There is no abnormal lift or heave.  The abdomen is soft and nontender.  The bowel sounds are normal.  The liver and spleen are not enlarged.  There are no abdominal masses.  There are no abdominal bruits.  Extremities reveal good pedal pulses.  There is no phlebitis or edema.  There is no cyanosis or clubbing.  Strength is normal and symmetrical in all extremities.  There is no lateralizing weakness.  There are no sensory deficits.  The skin is warm and dry.  There is no rash.     Assessment / Plan: The patient will return in about a month for an echocardiogram.  Because of her hair loss we are switching her off bisoprolol and we will start diltiazem CD 120 mg one daily.  She will also add Biotin 1 tablet daily to her regimen. Recheck in 6 months for followup office visit lipid panel hepatic function panel and basal metabolic panel.

## 2012-09-21 NOTE — Assessment & Plan Note (Signed)
Were reviewed today's labs which showed satisfactory response to therapy she is not having any myalgias from her low dose rosuvastatin

## 2012-09-21 NOTE — Patient Instructions (Addendum)
Add Biotin one daily (OTC)  STOP Bisoprolol   START Diltiazem CD 120 mg daily, Rx sent to [facility]  Your physician has requested that you have an echocardiogram. Echocardiography is a painless test that uses sound waves to create images of your heart. It provides your doctor with information about the size and shape of your heart and how well your heart's chambers and valves are working. This procedure takes approximately one hour. There are no restrictions for this procedure.  Schedule in about a month   Your physician wants you to follow-up in: 6 months You will receive a reminder letter in the mail two months in advance. If you don't receive a letter, please call our office to schedule the follow-up appointment.

## 2012-10-10 ENCOUNTER — Telehealth: Payer: Self-pay | Admitting: Cardiology

## 2012-10-10 ENCOUNTER — Observation Stay (HOSPITAL_COMMUNITY)
Admission: EM | Admit: 2012-10-10 | Discharge: 2012-10-11 | Disposition: A | Discharge status: Home / Self Care | Payer: Medicare Other | Attending: Internal Medicine | Admitting: Internal Medicine

## 2012-10-10 ENCOUNTER — Encounter (HOSPITAL_COMMUNITY): Payer: Self-pay | Admitting: *Deleted

## 2012-10-10 ENCOUNTER — Emergency Department (HOSPITAL_COMMUNITY): Payer: Medicare Other

## 2012-10-10 DIAGNOSIS — E7800 Pure hypercholesterolemia, unspecified: Secondary | ICD-10-CM | POA: Insufficient documentation

## 2012-10-10 DIAGNOSIS — I10 Essential (primary) hypertension: Secondary | ICD-10-CM | POA: Insufficient documentation

## 2012-10-10 DIAGNOSIS — Z79899 Other long term (current) drug therapy: Secondary | ICD-10-CM | POA: Insufficient documentation

## 2012-10-10 DIAGNOSIS — I498 Other specified cardiac arrhythmias: Principal | ICD-10-CM | POA: Insufficient documentation

## 2012-10-10 DIAGNOSIS — I471 Supraventricular tachycardia: Secondary | ICD-10-CM | POA: Diagnosis present

## 2012-10-10 DIAGNOSIS — I059 Rheumatic mitral valve disease, unspecified: Secondary | ICD-10-CM | POA: Insufficient documentation

## 2012-10-10 DIAGNOSIS — E039 Hypothyroidism, unspecified: Secondary | ICD-10-CM | POA: Insufficient documentation

## 2012-10-10 DIAGNOSIS — R0602 Shortness of breath: Secondary | ICD-10-CM | POA: Insufficient documentation

## 2012-10-10 DIAGNOSIS — Z791 Long term (current) use of non-steroidal anti-inflammatories (NSAID): Secondary | ICD-10-CM | POA: Insufficient documentation

## 2012-10-10 DIAGNOSIS — R6889 Other general symptoms and signs: Secondary | ICD-10-CM | POA: Insufficient documentation

## 2012-10-10 DIAGNOSIS — Z7982 Long term (current) use of aspirin: Secondary | ICD-10-CM | POA: Insufficient documentation

## 2012-10-10 DIAGNOSIS — Z7902 Long term (current) use of antithrombotics/antiplatelets: Secondary | ICD-10-CM | POA: Insufficient documentation

## 2012-10-10 HISTORY — DX: Supraventricular tachycardia, unspecified: I47.10

## 2012-10-10 HISTORY — DX: Supraventricular tachycardia: I47.1

## 2012-10-10 LAB — CBC WITH DIFFERENTIAL/PLATELET
Basophils Relative: 0 % (ref 0–1)
Eosinophils Absolute: 0.1 10*3/uL (ref 0.0–0.7)
HCT: 38.5 % (ref 36.0–46.0)
Hemoglobin: 13.2 g/dL (ref 12.0–15.0)
MCH: 30.3 pg (ref 26.0–34.0)
MCHC: 34.3 g/dL (ref 30.0–36.0)
MCV: 88.3 fL (ref 78.0–100.0)
Monocytes Absolute: 0.7 10*3/uL (ref 0.1–1.0)
Monocytes Relative: 8 % (ref 3–12)

## 2012-10-10 LAB — BASIC METABOLIC PANEL
BUN: 19 mg/dL (ref 6–23)
Chloride: 101 mEq/L (ref 96–112)
Creatinine, Ser: 0.88 mg/dL (ref 0.50–1.10)
GFR calc Af Amer: 71 mL/min — ABNORMAL LOW (ref >90)
GFR calc non Af Amer: 61 mL/min — ABNORMAL LOW (ref >90)

## 2012-10-10 MED ORDER — NITROGLYCERIN 0.4 MG SL SUBL: 0.4000 mg | SUBLINGUAL_TABLET | SUBLINGUAL | Status: DC | PRN

## 2012-10-10 MED ORDER — ASPIRIN 81 MG PO TABS: 81.0000 mg | ORAL_TABLET | ORAL | Status: DC

## 2012-10-10 MED ORDER — TRIAMTERENE-HCTZ 37.5-25 MG PO TABS
0.5000 | ORAL_TABLET | Freq: Every day | ORAL | Status: DC
  Administered 2012-10-11: 0.5 via ORAL
  Filled 2012-10-10 (×2): qty 0.5

## 2012-10-10 MED ORDER — IRBESARTAN 75 MG PO TABS
75.0000 mg | ORAL_TABLET | Freq: Every day | ORAL | Status: DC
  Administered 2012-10-11: 75 mg via ORAL
  Filled 2012-10-10 (×2): qty 1

## 2012-10-10 MED ORDER — ZOLPIDEM TARTRATE 5 MG PO TABS: 5.0000 mg | ORAL_TABLET | Freq: Every evening | ORAL | Status: DC | PRN

## 2012-10-10 MED ORDER — ENOXAPARIN SODIUM 80 MG/0.8ML ~~LOC~~ SOLN
80.0000 mg | Freq: Two times a day (BID) | SUBCUTANEOUS | Status: DC
  Administered 2012-10-10 – 2012-10-11 (×2): 80 mg via SUBCUTANEOUS
  Filled 2012-10-10 (×4): qty 0.8

## 2012-10-10 MED ORDER — SODIUM CHLORIDE 0.9 % IV SOLN: 250.0000 mL | INTRAVENOUS | Status: DC | PRN

## 2012-10-10 MED ORDER — LEVOTHYROXINE SODIUM 75 MCG PO TABS
75.0000 ug | ORAL_TABLET | ORAL | Status: DC
  Filled 2012-10-10: qty 1

## 2012-10-10 MED ORDER — OFF THE BEAT BOOK
Freq: Once | Status: AC
  Administered 2012-10-10: 21:00:00
  Filled 2012-10-10: qty 1

## 2012-10-10 MED ORDER — ACETAMINOPHEN 325 MG PO TABS
650.0000 mg | ORAL_TABLET | ORAL | Status: DC | PRN
  Administered 2012-10-10: 650 mg via ORAL
  Filled 2012-10-10: qty 2

## 2012-10-10 MED ORDER — ASPIRIN 81 MG PO CHEW
81.0000 mg | CHEWABLE_TABLET | Freq: Every day | ORAL | Status: DC
  Administered 2012-10-11: 81 mg via ORAL
  Filled 2012-10-10: qty 1

## 2012-10-10 MED ORDER — PANTOPRAZOLE SODIUM 40 MG PO TBEC
40.0000 mg | DELAYED_RELEASE_TABLET | Freq: Every day | ORAL | Status: DC
  Administered 2012-10-10 – 2012-10-11 (×2): 40 mg via ORAL
  Filled 2012-10-10 (×2): qty 1

## 2012-10-10 MED ORDER — ONDANSETRON HCL 4 MG/2ML IJ SOLN: 4.0000 mg | Freq: Four times a day (QID) | INTRAMUSCULAR | Status: DC | PRN

## 2012-10-10 MED ORDER — VERAPAMIL HCL ER 180 MG PO TBCR
180.0000 mg | EXTENDED_RELEASE_TABLET | Freq: Every day | ORAL | Status: DC
  Administered 2012-10-10 – 2012-10-11 (×2): 180 mg via ORAL
  Filled 2012-10-10 (×2): qty 1

## 2012-10-10 MED ORDER — SODIUM CHLORIDE 0.9 % IJ SOLN: 3.0000 mL | INTRAMUSCULAR | Status: DC | PRN

## 2012-10-10 MED ORDER — SODIUM CHLORIDE 0.9 % IJ SOLN
3.0000 mL | Freq: Two times a day (BID) | INTRAMUSCULAR | Status: DC
  Administered 2012-10-10: 3 mL via INTRAVENOUS

## 2012-10-10 NOTE — Progress Notes (Signed)
ANTICOAGULATION CONSULT NOTE - Initial Consult  Pharmacy Consult for Lovenox Indication: chest pain/ACS  Allergies  Allergen Reactions  . Azithromycin     Or any drug that ends in "mycin"  . Bextra (Valdecoxib)     Unknown  . Lipitor (Atorvastatin Calcium)     MYALGIAS  . Ramipril Cough  . Risedronate Sodium      GI ISSUES  . Zocor (Simvastatin)     MYALGIAS  . Niaspan (Niacin Er) Rash    Patient Measurements: Height: 5' 5.5" (166.4 cm) Weight: 180 lb (81.647 kg) IBW/kg (Calculated) : 58.15  Heparin Dosing Weight: 75  Vital Signs: Temp: 97.9 F (36.6 C) (11/23 1721) Temp src: Oral (11/23 1721) BP: 125/56 mmHg (11/23 1721) Pulse Rate: 73  (11/23 1721)  Labs:  Basename 10/10/12 1407  HGB 13.2  HCT 38.5  PLT 222  APTT --  LABPROT --  INR --  HEPARINUNFRC --  CREATININE 0.88  CKTOTAL --  CKMB --  TROPONINI 0.52*    Estimated Creatinine Clearance: 55.3 ml/min (by C-G formula based on Cr of 0.88).   Medical History: Past Medical History  Diagnosis Date  . Hypertension   . SVT (supraventricular tachycardia)   . Aortic stenosis, mild   . Hypothyroidism   . Pure hypercholesterolemia      Assessment: 78 yo female with hx HTN, aortic stenosis, palpitations admitted with chest pressure, SVT and positive troponins.  Thought secondary to demand, but starting lovenox overnight.  CrCl ~ 55 ml/min  Goal of Therapy:  Anti-Xa level 0.6-1.2 units/ml 4hrs after LMWH dose given Monitor platelets by anticoagulation protocol: Yes   Plan:  1. Lovenox 80 mg sq q12 hrs. 2. CBC q 72 hrs while on Lovenox. 3. Monitor for signs or symptoms of bleeding.  Mckynleigh Mussell, Gwenlyn Found 10/10/2012,5:26 PM

## 2012-10-10 NOTE — H&P (Signed)
History and Physical  Patient ID: Kim Ward MRN: [id number], SOB: [birth year] 78 y.o. Date of Encounter: 10/10/2012, 4:55 PM  Primary Physician: Londell Moh, MD Primary Cardiologist: TB  Chief Complaint: SVT  History of Present Illness: Kim Ward is a 79 y.o. female with history of hypertension and mild aortic stenosis with known SVT. She has been treated in the past with beta blockers associated with alopecia. She was recently started on diltiazem because of this. She's had 3-4 episodes over the last couple of weeks. The others have been spontaneously terminating, although she thinks she stop them by taking 3-4 aspirin. They're associated with shortness of breath pounding but not in her neck and chest discomfort. They are frog negative and diuretic negative. The episodes are provoked mostly by bending or squatting.  These episodes first occurred about 10 years ago and she has been taking bisoprolol for a long time with complete suppression (almost anyway) until she, because of alopecia, asked Dr. Patty Sermons to switch the medicine which he did about 3 or 4 weeks ago.  Today the episode failed to terminate on its own. EMS was called adenosine was given in the episode terminated. She is brought to the emergency room. Troponins were positive.  She has no history of exercise intolerance. She has no limitations with her breathing.  Last echocardiogram 2011 and a mean gradient of 11 consistent w mild aortic stenosis  Past Medical History  Diagnosis Date  . Hypertension   . SVT (supraventricular tachycardia)   . Aortic stenosis, mild   . Hypothyroidism   . Pure hypercholesterolemia      Past Surgical History  Procedure Date  . Dilation and curettage of uterus   . Tonsillectomy       No current facility-administered medications for this encounter.   Current Outpatient Prescriptions  Medication Sig Dispense Refill  . aspirin 81 MG tablet Take 81 mg by mouth every  other day.        [name] BIOTIN PO Take 1 tablet by mouth daily.       . Coenzyme Q10 (COQ10) 100 MG capsule Take 100 mg by mouth daily.        . Cyanocobalamin (B-12 PO) Take 1 tablet by mouth daily.       [name] diltiazem (CARDIZEM CD) 120 MG 24 hr capsule Take 1 capsule (120 mg total) by mouth daily.  90 capsule  3  . ibuprofen (ADVIL) 200 MG tablet Take 400 mg by mouth every 6 (six) hours as needed. For pain      . levothyroxine (SYNTHROID, LEVOTHROID) 75 MCG tablet Take 75 mcg by mouth See admin instructions. Take daily except do not take on Sunday      . loratadine (CLARITIN) 10 MG tablet Take 10 mg by mouth daily as needed. For allergies      . Multiple Minerals-Vitamins (CITRACAL PLUS PO) Take 1 tablet by mouth daily.       . multivitamin (THERAGRAN) per tablet Take 1 tablet by mouth daily.        . nitroGLYCERIN (NITROSTAT) 0.4 MG SL tablet Place 0.4 mg under the tongue every 5 (five) minutes as needed. For chest pain. Do not exceed 3 tablets in 15 minutes      . omeprazole (PRILOSEC) 20 MG capsule Take 20 mg by mouth daily as needed. For heartburn      . Pyridoxine HCl (B-6 PO) Take 1 tablet by mouth daily.       . rosuvastatin (  CRESTOR) 5 MG tablet Take 5 mg by mouth daily. Alternate 1/2 tablet then 1 tablet every other day      . triamterene-hydrochlorothiazide (MAXZIDE-25) 37.5-25 MG per tablet Take 0.5 tablets by mouth daily.       . valsartan (DIOVAN) 80 MG tablet Take 40 mg by mouth daily.       [name] zolpidem (AMBIEN) 10 MG tablet Take 5 mg by mouth at bedtime as needed. For insomnia      . [DISCONTINUED] nitroGLYCERIN (NITROSTAT) 0.4 MG SL tablet TAKE 1 TABLET UNDER TONGUE AS NEEDED.  25 tablet  11     Allergies: Allergies  Allergen Reactions  . Azithromycin     Or any drug that ends in "mycin"  . Bextra (Valdecoxib)     Unknown  . Lipitor (Atorvastatin Calcium)     MYALGIAS  . Ramipril Cough  . Risedronate Sodium      GI ISSUES  . Zocor (Simvastatin)     MYALGIAS  . Niaspan  (Niacin Er) Rash     History  Substance Use Topics  . Smoking status: Never Smoker   . Smokeless tobacco: Not on file  . Alcohol Use: Yes      No family history on file.    ROS:  Please see the history of present illness.   Negative except Stress related to her daughter's ongoing bout with cancer  All other systems reviewed and negative.   Vital Signs: Blood pressure 139/68, pulse 84, temperature 98.1 F (36.7 C), temperature source Oral, resp. rate 20, height 5' 5.5" (1.664 m), weight 180 lb (81.647 kg), SpO2 99.00%.  PHYSICAL EXAM: General:  Well nourished, well developed female in no acute distress  HEENT: normal Lymph: no adenopathy Neck: no JVD Endocrine:  No thryomegaly Vascular: No carotid bruits normal upstroke; FA pulses 2+ bilaterally without bruits Cardiac: Regular rate and rhythm with a normal S1-1 physiologically split S2-1 mid peaking 2/6 systolic murmur Back: without kyphosis/scoliosis, no CVA tenderness Lungs:  clear to auscultation bilaterally, no wheezing, rhonchi or rales Abd: soft, nontender, no hepatomegaly Ext: no edema Musculoskeletal:  No deformities, BUE and BLE strength normal and equal Skin: warm and dry Neuro:  CNs 2-12 intact, no focal abnormalities noted Psych:  Normal affect   EKG: Sinus rhythm at 95 Intervals 22/08/35 Mild nonspecific ST changes  EMS telemetry narrow QRS tachycardia at a cycle length of 340 ms P wave is discernible in the proximal portion of the ST segment   Labs:   Lab Results  Component Value Date   WBC 8.4 10/10/2012   HGB 13.2 10/10/2012   HCT 38.5 10/10/2012   MCV 88.3 10/10/2012   PLT 222 10/10/2012     Lab 10/10/12 1407  NA 134*  K 3.8  CL 101  CO2 23  BUN 19  CREATININE 0.88  CALCIUM 9.3  PROT --  BILITOT --  ALKPHOS --  ALT --  AST --  GLUCOSE 98    Basename 10/10/12 1407  CKTOTAL --  CKMB --  TROPONINI 0.52*   Lab Results  Component Value Date   CHOL 199 09/18/2012   HDL 51.80  09/18/2012   LDLCALC 97 02/11/2011   TRIG 211.0* 09/18/2012   No results found for this basename: DDIMER   BNP No results found for this basename: probnp       ASSESSMENT AND PLAN:    Patient Active Hospital Problem List: SVT (supraventricular tachycardia) (12/31/2011)   Elevated troponin (10/10/2012)    The  patient has supraventricular tachycardia, epidemiologicaly it  should be AV node reentry although ECG and symptoms suggest concealed accessory pathway.  In any case treatment options would include AV nodal blocking agentsor catheter ablation.  I have reviewed with the patient that this is really a lifestyle choices decision as SVT while frightening is not likely life-threatening. Diltiazem is clearly a failure; verapamil could be chosen or beta blockers resumed. For now, we will institute verapamil. She can followup with Dr. Patty Sermons for consideration of catheter ablation or reinstitution of her beta blockers in the event that the verapamil is not sufficiently suppressing.  Her positive troponins likely represent type 2 enzymes elevation secondary to a demand. We will however give her heparin and monitor overnight. Anticipate discharge in the morning with outpatient stress test followup.

## 2012-10-10 NOTE — ED Notes (Signed)
Patient is alert and oriented.  Skin warm and dry.  She complains of feeling tired only.  Friend remains at bedside.

## 2012-10-10 NOTE — ED Provider Notes (Signed)
2:37 PM Patient to move to CDU holding for labs and discussion with cardiology.  Sign out received from Dr Bernette Mayers.  Pt with hx aortic stenosis with recent history of tachycardia/palpitations over several weeks, has been followed by Dr Patty Sermons or [facility] cardiology for this.  Today had a longer episode with both tachycardia and chest pressure, went to urgent care where she was found to have have a HR 180, in SVT.  Was given adenosine with resolution.  Now in Normal Sinus Rhythm.  Plan is for labs, call to cardiologist for discussion.   3:23 PM Pt has not yet arrived to CDU.  Dr Bernette Mayers reports that troponin is positive.  Pt signed out to [employer], PA-C, who willassume care of patient if she comes to CDU.    [place], [place] 10/10/12 1523

## 2012-10-10 NOTE — ED Provider Notes (Signed)
Medical screening examination/treatment/procedure(s) were conducted as a shared visit with non-physician practitioner(s) and myself.  I personally evaluated the patient during the encounter   Charles B. Bernette Mayers, MD 10/10/12 1525

## 2012-10-10 NOTE — ED Notes (Signed)
Patient was at the grocery store,  Had onset of palpitations.  She also complained of sob.  Patient was found to be svt with rate up to 180's.  Patient with IV placed to the left forearm.  She received adenosine 12mg  ivp with conversion to nsr.  Patient heartrate is 103 upon arrival.  She states she is feeling better.

## 2012-10-10 NOTE — Telephone Encounter (Signed)
Returned call to Ms. Sakamoto. She reported developing heart palpitations and shortness of breath while at the grocery store this morning. She had to get help from the staff and sit for a while. She felt somewhat better so she drove herself home. She states the palpitations have continued and she is still short of breath, although it has improved some. She denies chest pain, dizziness, or syncope. I instructed her to seek medical attention either at an urgent care or the emergency room. She states she has a friend who can take her.   [place], PA-C 10/10/2012 12:25 PM

## 2012-10-10 NOTE — ED Notes (Signed)
Cardiologist is at bedside.

## 2012-10-10 NOTE — ED Notes (Signed)
Patient is keeping her clothing, pants, socks, shirt, bra, coat, dress shoes, wallet, and purse.  She is keeping her yellow metal ring with clear stones, glasses, and dentures.  Her friend is taking her cash and credit cards

## 2012-10-10 NOTE — ED Provider Notes (Signed)
History     CSN: [id number]  Arrival date & time 10/10/12  1349   First MD Initiated Contact with Patient 10/10/12 1407      Chief Complaint  Patient presents with  . Shortness of Breath  . Palpitations    (Consider location/radiation/quality/duration/timing/severity/associated sxs/prior treatment) HPI Pt with history of mild aortic stenosis reports she has had several self-limited episodes of tachy palpitations over the last few weeks, has been seen by Dr. Patty Sermons who made medications changes (stopped bystolic, started diltiazem). She reports she was at the grocery store today when she began to have a particularly bad episode that did not improve with rest, went to urgent care where she was found to have SVT, rate 180s. EMS gave adenosine 12mg  and converted to NSR. She reports she was having some dizziness, SOB and chest pressure during the episode. All of that has resolved now, she is asymptomatic at the time of my evaluation.   Past Medical History  Diagnosis Date  . Hypertension   . Heart murmur   . Aortic stenosis, mild   . Hypothyroidism   . Pure hypercholesterolemia     Past Surgical History  Procedure Date  . Dilation and curettage of uterus   . Tonsillectomy     No family history on file.  History  Substance Use Topics  . Smoking status: Never Smoker   . Smokeless tobacco: Not on file  . Alcohol Use: Yes    OB History    Grav Para Term Preterm Abortions TAB SAB Ect Mult Living                  Review of Systems All other systems reviewed and are negative except as noted in HPI.   Allergies  Bextra; Lipitor; Ramipril; Risedronate sodium; Zocor; and Niaspan  Home Medications   Current Outpatient Rx  Name  Route  Sig  Dispense  Refill  . LORATADINE 10 MG PO TABS   Oral   Take 10 mg by mouth daily as needed.         [name] NITROGLYCERIN 0.4 MG SL SUBL   Sublingual   Place 0.4 mg under the tongue every 5 (five) minutes as needed. For chest pain. Do  not exceed 3 tablets in 15 minutes         . OMEPRAZOLE 20 MG PO CPDR   Oral   Take 20 mg by mouth daily as needed.         . ASPIRIN 81 MG PO TABS   Oral   Take 81 mg by mouth every other day.           [name] BIOTIN PO   Oral   Take 1 tablet by mouth daily.          [name] BISOPROLOL FUMARATE 5 MG PO TABS               . COENZYME Q10 100 MG PO CAPS   Oral   Take 100 mg by mouth daily.           . B-12 PO   Oral   Take 1 tablet by mouth daily.          [name] DILTIAZEM HCL ER COATED BEADS 120 MG PO CP24   Oral   Take 1 capsule (120 mg total) by mouth daily.   90 capsule   3   . IBUPROFEN 200 MG PO TABS   Oral   Take 400 mg by mouth every  6 (six) hours as needed.           [name] LEVOTHYROXINE SODIUM 88 MCG PO TABS   Oral   Take 75 mcg by mouth daily. 6 days a week         . CITRACAL PLUS PO   Oral   Take 1 tablet by mouth daily.          . MULTIVITAMINS PO TABS   Oral   Take 1 tablet by mouth daily.           [name] B-6 PO   Oral   Take 1 tablet by mouth daily.          [name] ROSUVASTATIN CALCIUM 5 MG PO TABS   Oral   Take 5 mg by mouth daily. Alternate 1/2 tablet then 1 tablet every other day         . TRIAMTERENE-HCTZ 37.5-25 MG PO TABS   Oral   Take 0.5 tablets by mouth daily. 1/2 tab          . VALSARTAN 80 MG PO TABS   Oral   Take 40 mg by mouth daily. 1/2 tab         . ZOLPIDEM TARTRATE 10 MG PO TABS   Oral   Take 5 mg by mouth at bedtime as needed. 1/2 tab           BP 124/54  Pulse 93  Temp 98 F (36.7 C) (Oral)  Resp 18  Ht 5' 5.5" (1.664 m)  Wt 180 lb (81.647 kg)  BMI 29.50 kg/m2  SpO2 99%  Physical Exam  Nursing note and vitals reviewed. Constitutional: She is oriented to person, place, and time. She appears well-developed and well-nourished.  HENT:  Head: Normocephalic and atraumatic.  Eyes: EOM are normal. Pupils are equal, round, and reactive to light.  Neck: Normal range of motion. Neck supple.  Cardiovascular:  Normal rate, normal heart sounds and intact distal pulses.   Pulmonary/Chest: Effort normal and breath sounds normal.  Abdominal: Bowel sounds are normal. She exhibits no distension. There is no tenderness.  Musculoskeletal: Normal range of motion. She exhibits no edema and no tenderness.  Neurological: She is alert and oriented to person, place, and time. She has normal strength. No cranial nerve deficit or sensory deficit.  Skin: Skin is warm and dry. No rash noted.  Psychiatric: She has a normal mood and affect.    ED Course  Procedures (including critical care time)  Labs Reviewed  BASIC METABOLIC PANEL - Abnormal; Notable for the following:    Sodium 134 (*)     GFR calc non Af Amer 61 (*)     GFR calc Af Amer 71 (*)     All other components within normal limits  TROPONIN I - Abnormal; Notable for the following:    Troponin I 0.52 (*)     All other components within normal limits  CBC WITH DIFFERENTIAL   Dg Chest 2 View  10/10/2012  *RADIOLOGY REPORT*  Clinical Data: Chest pain and pressure.  CHEST - 2 VIEW  Comparison: None.  Findings: Lungs clear.  No pneumothorax or pleural fluid.  Heart size normal.  IMPRESSION: Negative chest.   Original Report Authenticated By: Holley Dexter, M.D.      No diagnosis found.    MDM   Date: 10/10/2012  Rate: 95  Rhythm: normal sinus rhythm  QRS Axis: normal  Intervals: PR prolonged  ST/T Wave abnormalities: normal  Conduction Disutrbances:first-degree A-V  block   Narrative Interpretation:   Old EKG Reviewed: unchanged  3:26 PM Pt remains asymptomatic. Trop is slightly positive. Discussed with Dr. Shirlee Latch who will evaluate the patient in the ED.         Latonda Larrivee B. Bernette Mayers, MD 10/10/12 1526

## 2012-10-11 LAB — LIPID PANEL
Cholesterol: 165 mg/dL (ref 0–200)
HDL: 50 mg/dL (ref >39)
Total CHOL/HDL Ratio: 3.3 RATIO
Triglycerides: 170 mg/dL — ABNORMAL HIGH (ref <150)
VLDL: 34 mg/dL (ref 0–40)

## 2012-10-11 MED ORDER — VERAPAMIL HCL ER 180 MG PO TBCR: 180.0000 mg | EXTENDED_RELEASE_TABLET | Freq: Every day | ORAL | Status: DC

## 2012-10-11 NOTE — Discharge Summary (Signed)
Discharge Summary   Patient ID: Kim Ward MRN: [id number], DOB/AGE: September 23, 1931 78 y.o.  Primary MD: Londell Moh, MD Primary Cardiologist: Dr. Patty Sermons Admit date: 10/10/2012 D/C date:     10/11/2012      Primary Discharge Diagnoses:  1. Supraventricular Tachycardia  - Terminated with adenosine  - Diltiazem switched to Verapamil  2. Elevated Troponins  - Peak troponin 0.67  - Felt demand ischemia in the setting of SVT  - Outpatient Lexiscan Myoview  Secondary Discharge Diagnoses:  . Hypertension   . Aortic stenosis, mild   . Hypothyroidism   . Pure hypercholesterolemia     Allergies Allergies  Allergen Reactions  . Azithromycin     Or any drug that ends in "mycin"  . Bextra (Valdecoxib)     Unknown  . Lipitor (Atorvastatin Calcium)     MYALGIAS  . Ramipril Cough  . Risedronate Sodium      GI ISSUES  . Zocor (Simvastatin)     MYALGIAS  . Niaspan (Niacin Er) Rash    Diagnostic Studies/Procedures:  None  History of Present Illness: 78 y.o. female w/ the above medical problems who presented to [facility] on 10/10/12 with complaints of palpitations. She has a history of palpitations/SVT diagnosed about 10 yrs ago and previously treated by bisoprolol, but due to alopecia this was changed to diltiazem about a month ago. Over the last couple of weeks she has had 3-4 episodes of spontaneously terminating palpitations, but on the day of presentation they were continuous with associated sob prompting her to call EMS. She was found to be in SVT for which adenosine was given with termination and return to NSR.  Hospital Course: In the ED, EKG revealed NSR with mild nonspecific ST changes. EMS telemetry strip reviewed by Dr. Graciela Husbands demonstrated a narrow QRS tachycardia at a cycle length of 340 ms with P waves discernible in the proximal portion of the ST segment.  CXR was without acute cardiopulmonary abnormalities. Labs were significant for troponin 0.52  and unremarkable CBC/BMET. She was placed on oral verapamil (diltiazem dc'd) and was placed in observation. Cardiac enzymes were cycled with peak troponin 0.67 and subsequent down trending. She had no chest pain. It was felt this was demand ischemia in the setting of SVT. She will have a follow up outpatient Lexiscan Myoview for further ischemic evaluation. She remained in NSR on telemetry and was without any further symptoms. She was seen and evaluated by Dr. Graciela Husbands who felt she was stable for discharge home with plans for follow up as scheduled below.  Discharge Vitals: Blood pressure 100/67, pulse 64, temperature 98.3 F (36.8 C), temperature source Oral, resp. rate 14, height 5\' 5"  (1.651 m), weight 180 lb (81.647 kg), SpO2 97.00%.  Labs: Component Value Date   WBC 8.4 10/10/2012   HGB 13.2 10/10/2012   HCT 38.5 10/10/2012   MCV 88.3 10/10/2012   PLT 222 10/10/2012    Lab 10/10/12 1407  NA 134*  K 3.8  CL 101  CO2 23  BUN 19  CREATININE 0.88  CALCIUM 9.3  GLUCOSE 98   Basename 10/11/12 0202 10/10/12 1952 10/10/12 1407  TROPONINI 0.45* 0.67* 0.52*   Component Value Date   CHOL 165 10/11/2012   HDL 50 10/11/2012   LDLCALC 81 10/11/2012   TRIG 170* 10/11/2012    Discharge Medications     Medication List     As of 10/11/2012  8:38 AM    STOP taking these medications  diltiazem 120 MG 24 hr capsule   Commonly known as: CARDIZEM CD      TAKE these medications         ADVIL 200 MG tablet   Generic drug: ibuprofen   Take 400 mg by mouth every 6 (six) hours as needed. For pain      AMBIEN 10 MG tablet   Generic drug: zolpidem   Take 5 mg by mouth at bedtime as needed. For insomnia      aspirin 81 MG tablet   Take 81 mg by mouth every other day.      B-12 PO   Take 1 tablet by mouth daily.      B-6 PO   Take 1 tablet by mouth daily.      BIOTIN PO   Take 1 tablet by mouth daily.      CITRACAL PLUS PO   Take 1 tablet by mouth daily.      Coenzyme  Q10 100 MG capsule   Take 100 mg by mouth daily.      levothyroxine 75 MCG tablet   Commonly known as: SYNTHROID, LEVOTHROID   Take 75 mcg by mouth See admin instructions. Take daily except do not take on Sunday      loratadine 10 MG tablet   Commonly known as: CLARITIN   Take 10 mg by mouth daily as needed. For allergies      multivitamin per tablet   Take 1 tablet by mouth daily.      nitroGLYCERIN 0.4 MG SL tablet   Commonly known as: NITROSTAT   Place 0.4 mg under the tongue every 5 (five) minutes as needed. For chest pain. Do not exceed 3 tablets in 15 minutes      omeprazole 20 MG capsule   Commonly known as: PRILOSEC   Take 20 mg by mouth daily as needed. For heartburn      rosuvastatin 5 MG tablet   Commonly known as: CRESTOR   Take 5 mg by mouth daily. Alternate 1/2 tablet then 1 tablet every other day      triamterene-hydrochlorothiazide 37.5-25 MG per tablet   Commonly known as: MAXZIDE-25   Take 0.5 tablets by mouth daily.      valsartan 80 MG tablet   Commonly known as: DIOVAN   Take 40 mg by mouth daily.      verapamil 180 MG CR tablet   Commonly known as: CALAN-SR   Take 1 tablet (180 mg total) by mouth daily.         Disposition   Discharge Orders    Future Appointments: Provider: Department: Dept Phone: Center:   10/29/2012 10:30 AM [facility] SITE 3 ECHO LAB [phone] None     Future Orders Please Complete By Expires   Diet - low sodium heart healthy      Increase activity slowly        Follow-up Information    Follow up with [facility]. (Our office will call you with your appointment time for your stress test.)    Contact information:   [address] [place] [address] [phone]       Follow up with Cassell Clement, MD. (Our office will call you with your appointment time)    Contact information:   [facility] [address] [place]  [address] [phone]           Outstanding Labs/Studies:  Eugen[facility]ks Myoview  Duration  of Discharge Encounter: Greater than 30 minutes including physician and PA time.  Signed, Reda Gettis PA-C 10/11/2012, 8:38 AM

## 2012-10-11 NOTE — Progress Notes (Signed)
  Patient Name: Kim Ward      SUBJECTIVE: without complanints of sob or cp  Past Medical History  Diagnosis Date  . Hypertension   . SVT (supraventricular tachycardia)   . Aortic stenosis, mild   . Hypothyroidism   . Pure hypercholesterolemia     PHYSICAL EXAM Filed Vitals:   10/10/12 1721 10/10/12 1803 10/10/12 2100 10/11/12 0600  BP: 125/56 139/67 119/49 100/67  Pulse: 73 77 69 64  Temp: 97.9 F (36.6 C) 97.7 F (36.5 C) 97.8 F (36.6 C) 98.3 F (36.8 C)  TempSrc: Oral Oral    Resp: 20 18 14 14   Height:  5\' 5"  (1.651 m)    Weight:      SpO2: 96% 99% 97% 97%   Well developed and nourished in no acute distress HENT normal Neck supple with JVP-flat Carotids brisk and full without bruits Clear  Regular rate and rhythm, 2/6 SEM Abd-soft with active BS without hepatomegaly No Clubbing cyanosis edema Skin-warm and dry A & Oriented  Grossly normal sensory and motor function  TELEMETRY: Reviewed telemetry pt in nsr:   No intake or output data in the 24 hours ending 10/11/12 0759  LABS: Basic Metabolic Panel:  Lab 10/10/12 4540  NA 134*  K 3.8  CL 101  CO2 23  GLUCOSE 98  BUN 19  CREATININE 0.88  CALCIUM 9.3  MG --  PHOS --   Cardiac Enzymes:  Basename 10/11/12 0202 10/10/12 1952 10/10/12 1407  CKTOTAL -- -- --  CKMB -- -- --  CKMBINDEX -- -- --  TROPONINI 0.45* 0.67* 0.52*   CBC:  Lab 10/10/12 1407  WBC 8.4  NEUTROABS 5.6  HGB 13.2  HCT 38.5  MCV 88.3  PLT 222   Fasting Lipid Panel:  Basename 10/11/12 0203  CHOL 165  HDL 50  LDLCALC 81  TRIG 170*  CHOLHDL 3.3  LDLDIRECT --    ASSESSMENT AND PLAN:  Patient Active Hospital Problem List: SVT (supraventricular tachycardia) (12/31/2011)   Elevated troponin (10/10/2012)  Will plan discharge today on verapamil Out pt myoview 48 hrs Needs f/u appt with TB late Dec     Signed, Nehemie Casserly MD  10/11/2012

## 2012-10-20 ENCOUNTER — Emergency Department (HOSPITAL_COMMUNITY)
Admission: EM | Admit: 2012-10-20 | Discharge: 2012-10-20 | Disposition: A | Discharge status: Home / Self Care | Payer: Medicare Other | Attending: Emergency Medicine | Admitting: Emergency Medicine

## 2012-10-20 ENCOUNTER — Telehealth: Payer: Self-pay | Admitting: Cardiology

## 2012-10-20 ENCOUNTER — Emergency Department (HOSPITAL_COMMUNITY): Payer: Medicare Other

## 2012-10-20 DIAGNOSIS — Z79899 Other long term (current) drug therapy: Secondary | ICD-10-CM | POA: Insufficient documentation

## 2012-10-20 DIAGNOSIS — I10 Essential (primary) hypertension: Secondary | ICD-10-CM | POA: Insufficient documentation

## 2012-10-20 DIAGNOSIS — Q251 Coarctation of aorta: Secondary | ICD-10-CM | POA: Insufficient documentation

## 2012-10-20 DIAGNOSIS — Z7982 Long term (current) use of aspirin: Secondary | ICD-10-CM | POA: Insufficient documentation

## 2012-10-20 DIAGNOSIS — E7800 Pure hypercholesterolemia, unspecified: Secondary | ICD-10-CM | POA: Insufficient documentation

## 2012-10-20 DIAGNOSIS — E039 Hypothyroidism, unspecified: Secondary | ICD-10-CM | POA: Insufficient documentation

## 2012-10-20 DIAGNOSIS — I471 Supraventricular tachycardia: Secondary | ICD-10-CM

## 2012-10-20 DIAGNOSIS — I498 Other specified cardiac arrhythmias: Secondary | ICD-10-CM | POA: Insufficient documentation

## 2012-10-20 LAB — BASIC METABOLIC PANEL
Calcium: 9.3 mg/dL (ref 8.4–10.5)
GFR calc Af Amer: 72 mL/min — ABNORMAL LOW (ref >90)
GFR calc non Af Amer: 62 mL/min — ABNORMAL LOW (ref >90)
Potassium: 3.3 mEq/L — ABNORMAL LOW (ref 3.5–5.1)
Sodium: 137 mEq/L (ref 135–145)

## 2012-10-20 LAB — TROPONIN I: Troponin I: <0.3 ng/mL (ref <0.30)

## 2012-10-20 LAB — CBC WITH DIFFERENTIAL/PLATELET
Basophils Absolute: 0.1 10*3/uL (ref 0.0–0.1)
Basophils Relative: 1 % (ref 0–1)
Eosinophils Absolute: 0.1 10*3/uL (ref 0.0–0.7)
Eosinophils Relative: 1 % (ref 0–5)
MCH: 30 pg (ref 26.0–34.0)
MCHC: 33.4 g/dL (ref 30.0–36.0)
Neutrophils Relative %: 66 % (ref 43–77)
Platelets: 247 10*3/uL (ref 150–400)
RBC: 4.73 MIL/uL (ref 3.87–5.11)
RDW: 13.2 % (ref 11.5–15.5)

## 2012-10-20 NOTE — ED Provider Notes (Signed)
History     CSN: [id number]  Arrival date & time 10/20/12  1556   First MD Initiated Contact with Patient 10/20/12 1600      No chief complaint on file.   (Consider location/radiation/quality/duration/timing/severity/associated sxs/prior treatment) HPI Pt with known history of SVT had an episode about 2 weeks ago, converted with adenosine by EMS at that time, admitted for observation with mild increase in Troponin presumed to be from demand ischemia. She was scheduled for outpatient myoview to be done tomorrow. Today while she was running errands and walking up some steps, she had onset of tachy-palpitations, SOB but no CP. Symptoms were persistent again and so EMS was called. She was again converted with adenosine and is now feeling much better. She reports exercise intolerance recently, but no CP, cough, fever, leg swelling, orthopnea.   Past Medical History  Diagnosis Date  . Hypertension   . SVT (supraventricular tachycardia)   . Aortic stenosis, mild   . Hypothyroidism   . Pure hypercholesterolemia     Past Surgical History  Procedure Date  . Dilation and curettage of uterus   . Tonsillectomy     No family history on file.  History  Substance Use Topics  . Smoking status: Never Smoker   . Smokeless tobacco: Not on file  . Alcohol Use: Yes     Comment: 2 drinks per month    OB History    Grav Para Term Preterm Abortions TAB SAB Ect Mult Living                  Review of Systems All other systems reviewed and are negative except as noted in HPI.   Allergies  Azithromycin; Bextra; Lipitor; Ramipril; Risedronate sodium; Zocor; and Niaspan  Home Medications   Current Outpatient Rx  Name  Route  Sig  Dispense  Refill  . ASPIRIN 81 MG PO TABS   Oral   Take 81 mg by mouth every other day.           [name] BIOTIN PO   Oral   Take 1 tablet by mouth daily.          [name] COENZYME Q10 100 MG PO CAPS   Oral   Take 100 mg by mouth daily.           . B-12 PO    Oral   Take 1 tablet by mouth daily.          . IBUPROFEN 200 MG PO TABS   Oral   Take 400 mg by mouth every 6 (six) hours as needed. For pain         . LEVOTHYROXINE SODIUM 75 MCG PO TABS   Oral   Take 75 mcg by mouth See admin instructions. Take daily except do not take on Sunday         . LORATADINE 10 MG PO TABS   Oral   Take 10 mg by mouth daily as needed. For allergies         . CITRACAL PLUS PO   Oral   Take 1 tablet by mouth daily.          . MULTIVITAMINS PO TABS   Oral   Take 1 tablet by mouth daily.           [name] NITROGLYCERIN 0.4 MG SL SUBL   Sublingual   Place 0.4 mg under the tongue every 5 (five) minutes as needed. For chest pain. Do not exceed 3 tablets  in 15 minutes         . OMEPRAZOLE 20 MG PO CPDR   Oral   Take 20 mg by mouth daily as needed. For heartburn         . B-6 PO   Oral   Take 1 tablet by mouth daily.          [name] ROSUVASTATIN CALCIUM 5 MG PO TABS   Oral   Take 5 mg by mouth daily. Alternate 1/2 tablet then 1 tablet every other day         . TRIAMTERENE-HCTZ 37.5-25 MG PO TABS   Oral   Take 0.5 tablets by mouth daily.          [name] VALSARTAN 80 MG PO TABS   Oral   Take 40 mg by mouth daily.          [name] VERAPAMIL HCL ER 180 MG PO TBCR   Oral   Take 1 tablet (180 mg total) by mouth daily.   30 tablet   6   . ZOLPIDEM TARTRATE 10 MG PO TABS   Oral   Take 5 mg by mouth at bedtime as needed. For insomnia           There were no vitals taken for this visit.  Physical Exam  Nursing note and vitals reviewed. Constitutional: She is oriented to person, place, and time. She appears well-developed and well-nourished.  HENT:  Head: Normocephalic and atraumatic.  Eyes: EOM are normal. Pupils are equal, round, and reactive to light.  Neck: Normal range of motion. Neck supple.  Cardiovascular: Normal rate, normal heart sounds and intact distal pulses.   Pulmonary/Chest: Effort normal and breath sounds normal.   Abdominal: Bowel sounds are normal. She exhibits no distension. There is no tenderness.  Musculoskeletal: Normal range of motion. She exhibits no edema and no tenderness.  Neurological: She is alert and oriented to person, place, and time. She has normal strength. No cranial nerve deficit or sensory deficit.  Skin: Skin is warm and dry. No rash noted.  Psychiatric: She has a normal mood and affect.    ED Course  Procedures (including critical care time)  Labs Reviewed  BASIC METABOLIC PANEL - Abnormal; Notable for the following:    Potassium 3.3 (*)     GFR calc non Af Amer 62 (*)     GFR calc Af Amer 72 (*)     All other components within normal limits  CBC WITH DIFFERENTIAL  TROPONIN I   Dg Chest 2 View  10/20/2012  *RADIOLOGY REPORT*  Clinical Data: Supraventricular tachycardia, shortness of breath, heart racing, dizziness  CHEST - 2 VIEW  Comparison: 10/10/2012  Findings: Upper-normal size of cardiac silhouette. Mediastinal contours and pulmonary vascularity normal. Eventration anterior right diaphragm stable. No acute infiltrate, pleural effusion or pneumothorax. Levoconvex thoracic scoliosis with scattered endplate spur formation.  IMPRESSION: No acute abnormalities.   Original Report Authenticated By: Ulyses Southward, M.D.      No diagnosis found.    MDM   Date: 10/20/2012  Rate: 109  Rhythm: sinus tachycardia  QRS Axis: normal  Intervals: normal  ST/T Wave abnormalities: normal  Conduction Disutrbances:none  Narrative Interpretation:   Old EKG Reviewed: unchanged  5:28 PM Discussed with Dr. Jens Som on call for [facility]. He agrees with plan to encourage patient to have her Myoview as scheduled tomorrow morning and to return to the ED for recheck if symptoms return. Pt is amenable to this plan and  ready to go home.         Charles B. Bernette Mayers, MD 10/20/12 1729

## 2012-10-20 NOTE — Telephone Encounter (Signed)
Kim Ward- pt friend  [phone]  Pt friend calling to report pt SOB with palpitations over the last 40 minutes.

## 2012-10-20 NOTE — Telephone Encounter (Signed)
Per Dr. Patty Sermons pt should go to ER.

## 2012-10-20 NOTE — ED Notes (Signed)
Per EMS - pt was out running errands when she started to feel weak and her heart racing along with SOB. Called 911 she was in SVT, pt has hx of SVT last in it 2 weeks ago. EMS started an IV in left forearm. EMS administered 6mg  of adenosine and it immediately converted her. Pt was at rate of 180 after adenosine rate at 105. Pt a&Ox4. BP 128/68, HR 105, O2 sats 100%

## 2012-10-20 NOTE — Progress Notes (Signed)
Contacted by ER; patient developed SVT and received adenosine by EMS and converted to sinus. Patient denied CP or other symptoms. She is being DCed with close fu with Dr Graciela Husbands. Olga Millers 5:37 PM

## 2012-10-21 ENCOUNTER — Ambulatory Visit (HOSPITAL_COMMUNITY): Payer: Medicare Other | Attending: Cardiology | Admitting: Radiology

## 2012-10-21 VITALS — BP 137/70 | Ht 65.0 in | Wt 176.0 lb

## 2012-10-21 DIAGNOSIS — I4949 Other premature depolarization: Secondary | ICD-10-CM

## 2012-10-21 DIAGNOSIS — I471 Supraventricular tachycardia: Secondary | ICD-10-CM

## 2012-10-21 DIAGNOSIS — R42 Dizziness and giddiness: Secondary | ICD-10-CM | POA: Insufficient documentation

## 2012-10-21 DIAGNOSIS — R000 Tachycardia, unspecified: Secondary | ICD-10-CM | POA: Insufficient documentation

## 2012-10-21 DIAGNOSIS — E7800 Pure hypercholesterolemia, unspecified: Secondary | ICD-10-CM

## 2012-10-21 DIAGNOSIS — R0609 Other forms of dyspnea: Secondary | ICD-10-CM | POA: Insufficient documentation

## 2012-10-21 DIAGNOSIS — I10 Essential (primary) hypertension: Secondary | ICD-10-CM | POA: Insufficient documentation

## 2012-10-21 DIAGNOSIS — R0989 Other specified symptoms and signs involving the circulatory and respiratory systems: Secondary | ICD-10-CM | POA: Insufficient documentation

## 2012-10-21 DIAGNOSIS — R0602 Shortness of breath: Secondary | ICD-10-CM | POA: Insufficient documentation

## 2012-10-21 DIAGNOSIS — R5381 Other malaise: Secondary | ICD-10-CM | POA: Insufficient documentation

## 2012-10-21 DIAGNOSIS — R5383 Other fatigue: Secondary | ICD-10-CM | POA: Insufficient documentation

## 2012-10-21 DIAGNOSIS — E785 Hyperlipidemia, unspecified: Secondary | ICD-10-CM | POA: Insufficient documentation

## 2012-10-21 DIAGNOSIS — R079 Chest pain, unspecified: Secondary | ICD-10-CM

## 2012-10-21 DIAGNOSIS — R0789 Other chest pain: Secondary | ICD-10-CM | POA: Insufficient documentation

## 2012-10-21 MED ORDER — TECHNETIUM TC 99M SESTAMIBI GENERIC - CARDIOLITE
11.0000 | Freq: Once | INTRAVENOUS | Status: AC | PRN
  Administered 2012-10-21: 11 via INTRAVENOUS

## 2012-10-21 MED ORDER — REGADENOSON 0.4 MG/5ML IV SOLN
0.4000 mg | Freq: Once | INTRAVENOUS | Status: AC
  Administered 2012-10-21: 0.4 mg via INTRAVENOUS

## 2012-10-21 MED ORDER — TECHNETIUM TC 99M SESTAMIBI GENERIC - CARDIOLITE
33.0000 | Freq: Once | INTRAVENOUS | Status: AC | PRN
  Administered 2012-10-21: 33 via INTRAVENOUS

## 2012-10-21 NOTE — Progress Notes (Signed)
[facility] SITE 3 NUCLEAR MED [id number] [place] [address] [phone]  Cardiology Nuclear Med Study  Kim Ward is a 78 y.o. female     MRN : [id number]     DOB: Aug 01, 1931  Procedure Date: 10/21/2012  Nuclear Med Background Indication for Stress Test:  Evaluation for Ischemia, and Patient seen in hospital on 10-10-12 [facility] for SVT, increase troponin enzymes secondary to SVT. On 10-20-12 another ED visit for SVT with adenosine given, troponin negative x 1 History:   07/2010 ECHO: EF: 55-60% mild-mod AS, No prior CAD Hx Cardiac Risk Factors: Hypertension and Lipids  Symptoms:  Chest Pressure, Dizziness, DOE, Fatigue, Rapid HR and SOB   Nuclear Pre-Procedure Caffeine/Decaff Intake:  None > 12 hrs NPO After: 8:00pm   Lungs:  clear O2 Sat: 98% on room air. IV 0.9% NS with Angio Cath:  20g  IV Site: R Wrist x 1, tolerated well IV Started by:  Irean Hong, RN  Chest Size (in):  36 Cup Size: C  Height: 5\' 5"  (1.651 m)  Weight:  176 lb (79.833 kg)  BMI:  Body mass index is 29.29 kg/(m^2). Tech Comments:  Diovan this am    Nuclear Med Study 1 or 2 day study: 1 day  Stress Test Type:  Treadmill/Lexiscan  Reading MD: Cassell Clement, MD  Order Authorizing Provider:  Cassell Clement, MD  Resting Radionuclide: Technetium 35m Sestamibi  Resting Radionuclide Dose: 11.0 mCi   Stress Radionuclide:  Technetium 22m Sestamibi  Stress Radionuclide Dose: 33.0 mCi           Stress Protocol Rest HR: 65 Stress HR: 111  Rest BP: 137/70 Stress BP: 161/49  Exercise Time (min): n/a METS: n/a   Predicted Max HR: 141 bpm % Max HR: 78.72 bpm Rate Pressure Product: 62952   Dose of Adenosine (mg):  n/a Dose of Lexiscan: 0.4 mg  Dose of Atropine (mg): n/a Dose of Dobutamine: n/a mcg/kg/min (at max HR)  Stress Test Technologist: Milana Na, EMT-P  Nuclear Technologist:  Domenic Polite, CNMT     Rest Procedure:  Myocardial perfusion imaging was  performed at rest 45 minutes following the intravenous administration of Technetium 11m Sestamibi. Rest ECG: NSR - Normal EKG  Stress Procedure:  The patient received IV Lexiscan 0.4 mg over 15-seconds with concurrent low level exercise and then Technetium 39m Sestamibi was injected at 30-seconds while the patient continued walking one more minute. There were no significant changes with Lexiscan. This patient was given Aminophylline 150 mg IV for extreme headache and chest pressure and had full recovery of symptoms. Quantitative spect images were obtained after a 45-minute delay. Stress ECG: No significant change from baseline ECG  QPS Raw Data Images:  Normal; no motion artifact; normal heart/lung ratio. Stress Images:  Normal homogeneous uptake in all areas of the myocardium. Rest Images:  Normal homogeneous uptake in all areas of the myocardium. Subtraction (SDS):  No evidence of ischemia. Transient Ischemic Dilatation (Normal <1.22):  0.99 Lung/Heart Ratio (Normal <0.45):  0.35  Quantitative Gated Spect Images QGS EDV:  63 ml QGS ESV:  17 ml  Impression Exercise Capacity:  Lexiscan with low level exercise. BP Response:  Normal blood pressure response. Clinical Symptoms:  Atypical chest pain. ECG Impression:  No significant ST segment change suggestive of ischemia. Comparison with Prior Nuclear Study: No images to compare  Overall Impression:  Normal stress nuclear study.  LV Ejection Fraction: 73%.  LV Wall Motion:  NL LV  Function; NL Wall Motion   [employer]

## 2012-10-22 ENCOUNTER — Telehealth: Payer: Self-pay | Admitting: *Deleted

## 2012-10-22 ENCOUNTER — Other Ambulatory Visit (HOSPITAL_COMMUNITY): Payer: Medicare Other

## 2012-10-22 DIAGNOSIS — E876 Hypokalemia: Secondary | ICD-10-CM

## 2012-10-22 MED ORDER — POTASSIUM CHLORIDE CRYS ER 20 MEQ PO TBCR: 20.0000 meq | EXTENDED_RELEASE_TABLET | Freq: Every day | ORAL | Status: DC

## 2012-10-22 NOTE — Telephone Encounter (Signed)
Message copied by Burnell Blanks on Thu Oct 22, 2012  2:29 PM ------      Message from: Cassell Clement      Created: Thu Oct 22, 2012 12:10 PM       Please report.  The stress test was normal.  No ischemia or infarct.  EF normal.      In the ER on 10/20/12 her potassium was low 3.3.  This could be triggering her SVT.  I would like her to add KDur 20 meq daily

## 2012-10-22 NOTE — Telephone Encounter (Signed)
Advised and Rx'd Kdur

## 2012-10-29 ENCOUNTER — Ambulatory Visit (HOSPITAL_COMMUNITY): Payer: Medicare Other | Attending: Internal Medicine

## 2012-10-29 DIAGNOSIS — E785 Hyperlipidemia, unspecified: Secondary | ICD-10-CM | POA: Insufficient documentation

## 2012-10-29 DIAGNOSIS — I350 Nonrheumatic aortic (valve) stenosis: Secondary | ICD-10-CM

## 2012-10-29 DIAGNOSIS — I079 Rheumatic tricuspid valve disease, unspecified: Secondary | ICD-10-CM | POA: Insufficient documentation

## 2012-10-29 DIAGNOSIS — I359 Nonrheumatic aortic valve disorder, unspecified: Secondary | ICD-10-CM

## 2012-10-29 DIAGNOSIS — R0989 Other specified symptoms and signs involving the circulatory and respiratory systems: Secondary | ICD-10-CM | POA: Insufficient documentation

## 2012-10-29 DIAGNOSIS — R002 Palpitations: Secondary | ICD-10-CM | POA: Insufficient documentation

## 2012-10-29 DIAGNOSIS — R0609 Other forms of dyspnea: Secondary | ICD-10-CM | POA: Insufficient documentation

## 2012-10-29 DIAGNOSIS — I10 Essential (primary) hypertension: Secondary | ICD-10-CM | POA: Insufficient documentation

## 2012-10-29 NOTE — Progress Notes (Signed)
Echocardiogram performed.  

## 2012-10-30 ENCOUNTER — Encounter: Payer: Self-pay | Admitting: Cardiology

## 2012-10-30 ENCOUNTER — Ambulatory Visit (INDEPENDENT_AMBULATORY_CARE_PROVIDER_SITE_OTHER): Payer: Medicare Other | Admitting: Cardiology

## 2012-10-30 VITALS — BP 130/70 | HR 71 | Resp 18 | Ht 65.0 in | Wt 177.1 lb

## 2012-10-30 DIAGNOSIS — I471 Supraventricular tachycardia: Secondary | ICD-10-CM

## 2012-10-30 DIAGNOSIS — I350 Nonrheumatic aortic (valve) stenosis: Secondary | ICD-10-CM

## 2012-10-30 DIAGNOSIS — I359 Nonrheumatic aortic valve disorder, unspecified: Secondary | ICD-10-CM

## 2012-10-30 DIAGNOSIS — I498 Other specified cardiac arrhythmias: Secondary | ICD-10-CM

## 2012-10-30 DIAGNOSIS — I10 Essential (primary) hypertension: Secondary | ICD-10-CM

## 2012-10-30 MED ORDER — VERAPAMIL HCL ER 240 MG PO TBCR: 240.0000 mg | EXTENDED_RELEASE_TABLET | Freq: Every day | ORAL | Status: DC

## 2012-10-30 NOTE — Assessment & Plan Note (Signed)
She felt as if she was about to go back into SVT 3 mornings ago.  We will increase her verapamil up to 240 mg daily and observe response.  For mild constipation she will increase her free water intake and use a stool softener.

## 2012-10-30 NOTE — Patient Instructions (Addendum)
Increase you Verapamil (calan) SR to 240 mg daily, Rx sent to [facility]  Your physician wants you to follow-up in: 4 months  You will receive a reminder letter in the mail two months in advance. If you don't receive a letter, please call our office to schedule the follow-up appointment.

## 2012-10-30 NOTE — Progress Notes (Signed)
Kim Ward Date of Birth:  05/16/1931 [facility] [address] [place], [place]  [address] [phone]         Fax   [phone]  History of Present Illness: This 78 year old widowed Caucasian female is seen for a work in followup office visit. He has a past history of essential hypertension and a history of mild aortic stenosis. She also has a history of hypercholesterolemia. We last saw her in February 2012 after she had an episode of rapid tachycardia palpitations at home. EMS was called but she did not have to go to the emergency room.  Since then the patient has had 2 further episodes of supraventricular tachycardia.  In the interim she had been switched off of the beta blocker because of concern about hair loss.  She had been placed on Cardizem.  After her episode of SVT she was switched once again this time to verapamil SR 180 mg daily.  She had one additional episode while on verapamil SR 180 and 3 days ago she felt like she was going to go in it again but did not.  She has been taught how to do a Valsalva maneuver at home.  She has had some mild constipation from the verapamil. Since her last episode of SVT the patient had a walking Lexus scan stress test on 10/22/12 which showed no ischemia and her ejection fraction was 73%. The patient also had an echocardiogram 10/29/12 showing an ejection fraction of 55-65% with normal systolic function and with grade 2 diastolic dysfunction and with mild aortic stenosis and mild mitral regurgitation.   Current Outpatient Prescriptions  Medication Sig Dispense Refill  . aspirin 81 MG tablet Take 81 mg by mouth every other day.        [name] BIOTIN PO Take 1 tablet by mouth daily.       . Coenzyme Q10 (COQ10) 100 MG capsule Take 100 mg by mouth daily.        . Cyanocobalamin (B-12 PO) Take 1 tablet by mouth daily.       [name] ibuprofen (ADVIL) 200 MG tablet Take 400 mg by mouth every 6 (six) hours as needed. For pain      .  levothyroxine (SYNTHROID, LEVOTHROID) 75 MCG tablet Take 75 mcg by mouth See admin instructions. Take daily except do not take on Sunday      . loratadine (CLARITIN) 10 MG tablet Take 10 mg by mouth daily as needed. For allergies      . Multiple Minerals-Vitamins (CITRACAL PLUS PO) Take 1 tablet by mouth daily.       . multivitamin (THERAGRAN) per tablet Take 1 tablet by mouth daily.        . nitroGLYCERIN (NITROSTAT) 0.4 MG SL tablet Place 0.4 mg under the tongue every 5 (five) minutes as needed. For chest pain. Do not exceed 3 tablets in 15 minutes      . omeprazole (PRILOSEC) 20 MG capsule Take 20 mg by mouth daily as needed. For heartburn      . potassium chloride SA (K-DUR,KLOR-CON) 20 MEQ tablet Take 1 tablet (20 mEq total) by mouth daily.  30 tablet  5  . Pyridoxine HCl (B-6 PO) Take 1 tablet by mouth daily.       . rosuvastatin (CRESTOR) 5 MG tablet Take 5 mg by mouth daily. Alternate 1/2 tablet then 1 tablet every other day      . triamterene-hydrochlorothiazide (MAXZIDE-25) 37.5-25 MG per tablet Take 0.5 tablets by  mouth daily.       . valsartan (DIOVAN) 80 MG tablet Take 40 mg by mouth daily.       . verapamil (CALAN-SR) 240 MG CR tablet Take 1 tablet (240 mg total) by mouth daily.  30 tablet  6  . zolpidem (AMBIEN) 10 MG tablet Take 5 mg by mouth at bedtime as needed. For insomnia        Allergies  Allergen Reactions  . Azithromycin     Or any drug that ends in "mycin"  . Bextra (Valdecoxib)     Unknown  . Lipitor (Atorvastatin Calcium)     MYALGIAS  . Ramipril Cough  . Risedronate Sodium      GI ISSUES  . Zocor (Simvastatin)     MYALGIAS  . Niaspan (Niacin Er) Rash    Patient Active Problem List  Diagnosis  . Hypertension  . Hypercholesteremia  . Hypothyroid  . Aortic stenosis, mild  . SVT (supraventricular tachycardia)  . Elevated troponin    History  Smoking status  . Never Smoker   Smokeless tobacco  . Not on file    History  Alcohol Use  . Yes     Comment: 2 drinks per month    No family history on file.  Review of Systems: Constitutional: no fever chills diaphoresis or fatigue or change in weight.  Head and neck: no hearing loss, no epistaxis, no photophobia or visual disturbance. Respiratory: No cough, shortness of breath or wheezing. Cardiovascular: No chest pain peripheral edema, palpitations. Gastrointestinal: No abdominal distention, no abdominal pain, no change in bowel habits hematochezia or melena. Genitourinary: No dysuria, no frequency, no urgency, no nocturia. Musculoskeletal:No arthralgias, no back pain, no gait disturbance or myalgias. Neurological: No dizziness, no headaches, no numbness, no seizures, no syncope, no weakness, no tremors. Hematologic: No lymphadenopathy, no easy bruising. Psychiatric: No confusion, no hallucinations, no sleep disturbance.    Physical Exam: Filed Vitals:   10/30/12 1615  BP: 130/70  Pulse: 71  Resp: 18   the general appearance reveals a well-developed well-nourished woman in no distress.The head and neck exam reveals pupils equal and reactive.  Extraocular movements are full.  There is no scleral icterus.  The mouth and pharynx are normal.  The neck is supple.  The carotids reveal no bruits.  The jugular venous pressure is normal.  The  thyroid is not enlarged.  There is no lymphadenopathy.  The chest is clear to percussion and auscultation.  There are no rales or rhonchi.  Expansion of the chest is symmetrical.  The precordium is quiet.  The first heart sound is normal.  The second heart sound is physiologically split.  There is grade 2/6 systolic murmur of aortic stenosis at the base  There is no abnormal lift or heave.  The abdomen is soft and nontender.  The bowel sounds are normal.  The liver and spleen are not enlarged.  There are no abdominal masses.  There are no abdominal bruits.  Extremities reveal good pedal pulses.  There is no phlebitis or edema.  There is no cyanosis or  clubbing.  Strength is normal and symmetrical in all extremities.  There is no lateralizing weakness.  There are no sensory deficits.  The skin is warm and dry.  There is no rash.  EKG today shows normal sinus rhythm and is within normal limits.  PR interval is 206 ms   Assessment / Plan: Continue same medication except increase verapamil to 240 mg daily.  Recheck at her regular scheduled visit in about 4 or 5 months or sooner when necessary.  We also discussed that if her episodes of SVT continue or increase we would want to discuss possible SVT ablation with her.

## 2012-10-30 NOTE — Assessment & Plan Note (Signed)
Blood pressures remaining stable on current medication 

## 2012-10-30 NOTE — Assessment & Plan Note (Signed)
Her aortic stenosis is still mild by recent echocardiogram.

## 2012-11-13 ENCOUNTER — Encounter: Payer: Self-pay | Admitting: Cardiology

## 2012-12-07 ENCOUNTER — Telehealth: Payer: Self-pay | Admitting: Cardiology

## 2012-12-07 NOTE — Telephone Encounter (Signed)
Okay for cataract surgery 

## 2012-12-07 NOTE — Telephone Encounter (Signed)
Surgery not scheduled but wants to make sure ok. If ok fax [phone]. Will forward to  Dr. Patty Sermons for review

## 2012-12-07 NOTE — Telephone Encounter (Signed)
New Problem:    Called in wanting to you to call Dr. Ashley Royalty office, clearing her to have cataract surgery.  Please call back.

## 2012-12-08 NOTE — Telephone Encounter (Signed)
Advised patient and will fax over

## 2013-01-14 ENCOUNTER — Other Ambulatory Visit: Payer: Self-pay

## 2013-01-14 DIAGNOSIS — Z1231 Encounter for screening mammogram for malignant neoplasm of breast: Secondary | ICD-10-CM

## 2013-02-16 ENCOUNTER — Ambulatory Visit
Admission: RE | Admit: 2013-02-16 | Discharge: 2013-02-16 | Disposition: A | Discharge status: Home / Self Care | Payer: Medicare Other | Source: Ambulatory Visit

## 2013-02-16 DIAGNOSIS — Z1231 Encounter for screening mammogram for malignant neoplasm of breast: Secondary | ICD-10-CM

## 2013-03-03 ENCOUNTER — Ambulatory Visit: Payer: Medicare Other | Admitting: Cardiology

## 2013-03-08 ENCOUNTER — Encounter: Payer: Self-pay | Admitting: Cardiology

## 2013-03-08 ENCOUNTER — Ambulatory Visit (INDEPENDENT_AMBULATORY_CARE_PROVIDER_SITE_OTHER): Payer: Medicare Other | Admitting: Cardiology

## 2013-03-08 VITALS — BP 122/74 | HR 70 | Ht 65.0 in | Wt 178.0 lb

## 2013-03-08 DIAGNOSIS — I350 Nonrheumatic aortic (valve) stenosis: Secondary | ICD-10-CM

## 2013-03-08 DIAGNOSIS — I359 Nonrheumatic aortic valve disorder, unspecified: Secondary | ICD-10-CM

## 2013-03-08 DIAGNOSIS — Z8679 Personal history of other diseases of the circulatory system: Secondary | ICD-10-CM

## 2013-03-08 DIAGNOSIS — E7800 Pure hypercholesterolemia, unspecified: Secondary | ICD-10-CM

## 2013-03-08 DIAGNOSIS — I471 Supraventricular tachycardia: Secondary | ICD-10-CM

## 2013-03-08 DIAGNOSIS — I119 Hypertensive heart disease without heart failure: Secondary | ICD-10-CM

## 2013-03-08 DIAGNOSIS — I498 Other specified cardiac arrhythmias: Secondary | ICD-10-CM

## 2013-03-08 NOTE — Assessment & Plan Note (Signed)
The patient is having no symptoms referable to her mild aortic stenosis.

## 2013-03-08 NOTE — Assessment & Plan Note (Signed)
Blood pressure remained stable on current therapy.  No headaches or dizziness.  She recently had successful cataract surgery by Dr. Nile Riggs, on 02/17/13 and on 03/03/13

## 2013-03-08 NOTE — Assessment & Plan Note (Signed)
No further episodes of SVT and she is on verapamil and metoprolol

## 2013-03-08 NOTE — Patient Instructions (Addendum)
Your physician recommends that you continue on your current medications as directed. Please refer to the Current Medication list given to you today.  Your physician recommends that you schedule a follow-up appointment in: 4 months with fasting labs (lp/bmet/hfp) AND EKG (LABS FEW DAYS BEFORE APPT)

## 2013-03-08 NOTE — Progress Notes (Signed)
Kim Ward Date of Birth:  [birth year] [facility] [address] [place], [place]  [address] [phone]         Fax   [phone]  History of Present Illness: This 79 year old widowed Caucasian female is seen for a scheduled followup office visit.  She has a past history of essential hypertension and a history of mild aortic stenosis. She also has a history of hypercholesterolemia. We last saw her in February 2012 after she had an episode of rapid tachycardia palpitations at home. EMS was called but she did not have to go to the emergency room. Since then the patient has had 2 further episodes of supraventricular tachycardia. In the interim she had been switched off of the beta blocker because of concern about hair loss. She had been placed on Cardizem. After her episode of SVT she was switched once again this time to verapamil SR 180 mg daily. She had one additional episode while on verapamil SR 180 and 3 days ago she felt like she was going to go in it again but did not. She has been taught how to do a Valsalva maneuver at home. She has had some mild constipation from the verapamil.  Since her last episode of SVT the patient had a walking Lexus scan stress test on 10/22/12 which showed no ischemia and her ejection fraction was 73%.  The patient also had an echocardiogram 10/29/12 showing an ejection fraction of 55-65% with normal systolic function and with grade 2 diastolic dysfunction and with mild aortic stenosis and mild mitral regurgitation.  In December her dose of verapamil was increased back up to 240 mg daily and she has not had any further episodes of SVT   Current Outpatient Prescriptions  Medication Sig Dispense Refill  . aspirin 81 MG tablet Take 81 mg by mouth every other day.        [name] BIOTIN PO Take 1 tablet by mouth daily.       . Coenzyme Q10 (COQ10) 100 MG capsule Take 100 mg by mouth daily.        . Cyanocobalamin (B-12 PO) Take 1 tablet by mouth daily.        [name] ibuprofen (ADVIL) 200 MG tablet Take 400 mg by mouth every 6 (six) hours as needed. For pain      . levothyroxine (SYNTHROID, LEVOTHROID) 75 MCG tablet Take 75 mcg by mouth See admin instructions. Take daily except do not take on Sunday      . loratadine (CLARITIN) 10 MG tablet Take 10 mg by mouth daily as needed. For allergies      . Multiple Minerals-Vitamins (CITRACAL PLUS PO) Take 1 tablet by mouth daily.       . multivitamin (THERAGRAN) per tablet Take 1 tablet by mouth daily.        . nitroGLYCERIN (NITROSTAT) 0.4 MG SL tablet Place 0.4 mg under the tongue every 5 (five) minutes as needed. For chest pain. Do not exceed 3 tablets in 15 minutes      . omeprazole (PRILOSEC) 20 MG capsule Take 20 mg by mouth daily as needed. For heartburn      . potassium chloride SA (K-DUR,KLOR-CON) 20 MEQ tablet Take 1 tablet (20 mEq total) by mouth daily.  30 tablet  5  . Pyridoxine HCl (B-6 PO) Take 1 tablet by mouth daily.       . rosuvastatin (CRESTOR) 5 MG tablet Take 5 mg by mouth daily. Alternate 1/2 tablet then 1  tablet every other day      . triamterene-hydrochlorothiazide (MAXZIDE-25) 37.5-25 MG per tablet Take 0.5 tablets by mouth daily.       . valsartan (DIOVAN) 80 MG tablet Take 40 mg by mouth daily.       . verapamil (CALAN-SR) 240 MG CR tablet Take 1 tablet (240 mg total) by mouth daily.  30 tablet  6  . zolpidem (AMBIEN) 10 MG tablet Take 5 mg by mouth at bedtime as needed. For insomnia       No current facility-administered medications for this visit.    Allergies  Allergen Reactions  . Azithromycin     Or any drug that ends in "mycin"  . Bextra (Valdecoxib)     Unknown  . Lipitor (Atorvastatin Calcium)     MYALGIAS  . Ramipril Cough  . Risedronate Sodium      GI ISSUES  . Zocor (Simvastatin)     MYALGIAS  . Niaspan (Niacin Er) Rash    Patient Active Problem List  Diagnosis  . Hypertension  . Hypercholesteremia  . Hypothyroid  . Aortic stenosis, mild  . SVT  (supraventricular tachycardia)  . Elevated troponin    History  Smoking status  . Former Smoker  Smokeless tobacco  . Not on file    Comment: quit June 17, 1972    History  Alcohol Use  . Yes    Comment: 2 drinks per month    No family history on file.  Review of Systems: Constitutional: no fever chills diaphoresis or fatigue or change in weight.  Head and neck: no hearing loss, no epistaxis, no photophobia or visual disturbance. Respiratory: No cough, shortness of breath or wheezing. Cardiovascular: No chest pain peripheral edema, palpitations. Gastrointestinal: No abdominal distention, no abdominal pain, no change in bowel habits hematochezia or melena. Genitourinary: No dysuria, no frequency, no urgency, no nocturia. Musculoskeletal:No arthralgias, no back pain, no gait disturbance or myalgias. Neurological: No dizziness, no headaches, no numbness, no seizures, no syncope, no weakness, no tremors. Hematologic: No lymphadenopathy, no easy bruising. Psychiatric: No confusion, no hallucinations, no sleep disturbance.    Physical Exam: Filed Vitals:   03/08/13 1556  BP: 122/74  Pulse: 70   the general appearance reveals a well-developed well-nourished elderly woman in no distress.The head and neck exam reveals pupils equal and reactive.  Extraocular movements are full.  There is no scleral icterus.  The mouth and pharynx are normal.  The neck is supple.  The carotids reveal no bruits.  The jugular venous pressure is normal.  The  thyroid is not enlarged.  There is no lymphadenopathy.  The chest is clear to percussion and auscultation.  There are no rales or rhonchi.  Expansion of the chest is symmetrical.  The precordium is quiet.  The first heart sound is normal.  The second heart sound is physiologically split.  There is no  gallop rub or click.  There is a soft systolic ejection murmur at the aortic area.  No diastolic murmur.  There is no abnormal lift or heave.  The abdomen  is soft and nontender.  The bowel sounds are normal.  The liver and spleen are not enlarged.  There are no abdominal masses.  There are no abdominal bruits.  Extremities reveal good pedal pulses.  There is no phlebitis or edema.  There is no cyanosis or clubbing.  Strength is normal and symmetrical in all extremities.  There is no lateralizing weakness.  There are no sensory deficits.  The skin is warm and dry.  There is no rash.     Assessment / Plan:  The patient is to continue same medication.  Recheck in 4 months for followup office visit EKG and she will get fasting lipid panel hepatic function panel and basal metabolic panel a head of time

## 2013-05-31 ENCOUNTER — Other Ambulatory Visit: Payer: Self-pay | Admitting: Cardiology

## 2013-06-29 ENCOUNTER — Other Ambulatory Visit: Payer: Self-pay | Admitting: *Deleted

## 2013-06-29 DIAGNOSIS — E7800 Pure hypercholesterolemia, unspecified: Secondary | ICD-10-CM

## 2013-06-29 DIAGNOSIS — E039 Hypothyroidism, unspecified: Secondary | ICD-10-CM

## 2013-07-06 ENCOUNTER — Other Ambulatory Visit: Payer: Medicare Other

## 2013-07-07 ENCOUNTER — Other Ambulatory Visit: Payer: Medicare Other

## 2013-07-09 ENCOUNTER — Ambulatory Visit: Payer: Medicare Other | Admitting: Cardiology

## 2013-07-15 ENCOUNTER — Other Ambulatory Visit (INDEPENDENT_AMBULATORY_CARE_PROVIDER_SITE_OTHER): Payer: Medicare Other

## 2013-07-15 DIAGNOSIS — E039 Hypothyroidism, unspecified: Secondary | ICD-10-CM

## 2013-07-21 ENCOUNTER — Other Ambulatory Visit: Payer: Medicare Other

## 2013-07-22 ENCOUNTER — Ambulatory Visit (INDEPENDENT_AMBULATORY_CARE_PROVIDER_SITE_OTHER): Payer: Medicare Other | Admitting: Endocrinology

## 2013-07-22 ENCOUNTER — Encounter: Payer: Self-pay | Admitting: Endocrinology

## 2013-07-22 VITALS — BP 122/78 | HR 94 | Temp 98.2°F | Resp 10 | Ht 65.0 in | Wt 176.0 lb

## 2013-07-22 DIAGNOSIS — E042 Nontoxic multinodular goiter: Secondary | ICD-10-CM

## 2013-07-22 NOTE — Progress Notes (Signed)
Patient ID: Kim Ward, female   DOB: 14-Apr-1931, 79 y.o.   MRN: [id number]  Reason for Appointment:  Thyroid nodules, followup visit    History of Present Illness:   She has a long-standing multinodular goiter since at least 1990. Has been on thyroid suppression with 75 mcg Levoxyl. She has been followed here since 1993. Needle aspiration was done in 1994 on the nodule in the/medial right lobe and was benign. She does not feel the nodule and has no local discomfort        The treatments that the patient has taken include Synthroid, recently 75 mcg.                  Compliance with the medical regimen has been as prescribed with taking the tablet in the morning before breakfast.  No visits with results within 1 Week(s) from this visit. Latest known visit with results is:  Appointment on 07/15/2013  Component Date Value Range Status  . Free T4 07/15/2013 1.39  0.60 - 1.60 ng/dL Final  . TSH 78/29/5621 0.86  0.35 - 5.50 uIU/mL Final      Medication List       This list is accurate as of: 07/22/13  9:17 AM.  Always use your most recent med list.               ADVIL 200 MG tablet  Generic drug:  ibuprofen  Take 400 mg by mouth every 6 (six) hours as needed. For pain     AMBIEN 10 MG tablet  Generic drug:  zolpidem  Take 5 mg by mouth at bedtime as needed. For insomnia     aspirin 81 MG tablet  Take 81 mg by mouth every other day.     B-12 PO  Take 1 tablet by mouth daily.     B-6 PO  Take 1 tablet by mouth daily.     BIOTIN PO  Take 1 tablet by mouth daily.     CITRACAL PLUS PO  Take 1 tablet by mouth daily.     Coenzyme Q10 100 MG capsule  Take 100 mg by mouth daily.     levothyroxine 75 MCG tablet  Commonly known as:  SYNTHROID, LEVOTHROID  Take 75 mcg by mouth See admin instructions. Take daily except do not take on Sunday     loratadine 10 MG tablet  Commonly known as:  CLARITIN  Take 10 mg by mouth daily as needed. For allergies     multivitamin per  tablet  Take 1 tablet by mouth daily.     nitroGLYCERIN 0.4 MG SL tablet  Commonly known as:  NITROSTAT  Place 0.4 mg under the tongue every 5 (five) minutes as needed. For chest pain. Do not exceed 3 tablets in 15 minutes     omeprazole 20 MG capsule  Commonly known as:  PRILOSEC  Take 20 mg by mouth daily as needed. For heartburn     potassium chloride SA 20 MEQ tablet  Commonly known as:  K-DUR,KLOR-CON  Take 1 tablet (20 mEq total) by mouth daily.     rosuvastatin 5 MG tablet  Commonly known as:  CRESTOR  Take 5 mg by mouth daily. Alternate 1/2 tablet then 1 tablet every other day     triamterene-hydrochlorothiazide 37.5-25 MG per tablet  Commonly known as:  MAXZIDE-25  Take 0.5 tablets by mouth daily.     valsartan 80 MG tablet  Commonly known as:  DIOVAN  Take  40 mg by mouth daily.     verapamil 240 MG CR tablet  Commonly known as:  CALAN-SR  TAKE 1 TABLET ONCE DAILY.        Past Medical History  Diagnosis Date  . Hypertension   . SVT (supraventricular tachycardia)   . Aortic stenosis, mild   . Hypothyroidism   . Pure hypercholesterolemia     Past Surgical History  Procedure Laterality Date  . Dilation and curettage of uterus    . Tonsillectomy      No family history on file.  Social History:  reports that she has quit smoking. She does not have any smokeless tobacco history on file. She reports that  drinks alcohol. She reports that she does not use illicit drugs.  Allergies:  Allergies  Allergen Reactions  . Azithromycin     Or any drug that ends in "mycin"  . Bextra [Valdecoxib]     Unknown  . Lipitor [Atorvastatin Calcium]     MYALGIAS  . Ramipril Cough  . Risedronate Sodium      GI ISSUES  . Zocor [Simvastatin]     MYALGIAS  . Niaspan [Niacin Er] Rash     Examination:   BP 122/78  Pulse 94  Temp(Src) 98.2 F (36.8 C)  Resp 10  Ht 5\' 5"  (1.651 m)  Wt 176 lb (79.833 kg)  BMI 29.29 kg/m2  SpO2 96%           NECK: no definite  enlargement felt on the right lobe, isthmus feels normal. A firm nodule is palpable with difficulty on swallowing on the left middle lobe area and is about 1.5-2 cm. No lymphadenopathy in the neck            Assessments   Long-standing multinodular goiter, has residual small left-sided nodule. Most likely her goiter has regressed spontaneously and most likely is not the result of thyroid suppression Will give her a trial of stopping the Synthroid for a year to see if there is any change in her goiter She can have followup TSH done next month with her PCP after stopping supplement  Kim Ward 07/22/2013, 9:17 AM

## 2013-07-22 NOTE — Patient Instructions (Addendum)
Stop Synthroid.  

## 2013-07-23 ENCOUNTER — Ambulatory Visit: Payer: Medicare Other | Admitting: Cardiology

## 2013-08-01 ENCOUNTER — Other Ambulatory Visit: Payer: Self-pay | Admitting: Cardiology

## 2013-08-02 ENCOUNTER — Other Ambulatory Visit (INDEPENDENT_AMBULATORY_CARE_PROVIDER_SITE_OTHER): Payer: Medicare Other

## 2013-08-02 DIAGNOSIS — E7800 Pure hypercholesterolemia, unspecified: Secondary | ICD-10-CM

## 2013-08-02 DIAGNOSIS — I119 Hypertensive heart disease without heart failure: Secondary | ICD-10-CM

## 2013-08-02 LAB — LIPID PANEL
Cholesterol: 213 mg/dL — ABNORMAL HIGH (ref 0–200)
HDL: 49 mg/dL (ref >39.00)
Total CHOL/HDL Ratio: 4
VLDL: 54.6 mg/dL — ABNORMAL HIGH (ref 0.0–40.0)

## 2013-08-02 LAB — BASIC METABOLIC PANEL
BUN: 16 mg/dL (ref 6–23)
CO2: 28 mEq/L (ref 19–32)
Calcium: 9.2 mg/dL (ref 8.4–10.5)
Chloride: 105 mEq/L (ref 96–112)
Creatinine, Ser: 0.9 mg/dL (ref 0.4–1.2)
GFR: 63.93 mL/min (ref >60.00)
Glucose, Bld: 90 mg/dL (ref 70–99)
Potassium: 3.8 mEq/L (ref 3.5–5.1)
Sodium: 138 mEq/L (ref 135–145)

## 2013-08-02 LAB — HEPATIC FUNCTION PANEL
ALT: 25 U/L (ref 0–35)
AST: 23 U/L (ref 0–37)
Albumin: 4.2 g/dL (ref 3.5–5.2)
Alkaline Phosphatase: 60 U/L (ref 39–117)
Bilirubin, Direct: 0 mg/dL (ref 0.0–0.3)
Total Bilirubin: 0.8 mg/dL (ref 0.3–1.2)
Total Protein: 7.1 g/dL (ref 6.0–8.3)

## 2013-08-02 LAB — LDL CHOLESTEROL, DIRECT: Direct LDL: 122 mg/dL

## 2013-08-02 NOTE — Progress Notes (Signed)
Quick Note:  Please make copy of labs for patient visit. ______ 

## 2013-08-05 ENCOUNTER — Ambulatory Visit: Payer: Medicare Other | Admitting: Cardiology

## 2013-08-06 ENCOUNTER — Encounter: Payer: Self-pay | Admitting: Cardiology

## 2013-08-06 ENCOUNTER — Ambulatory Visit (INDEPENDENT_AMBULATORY_CARE_PROVIDER_SITE_OTHER): Payer: Medicare Other | Admitting: Cardiology

## 2013-08-06 VITALS — BP 124/60 | HR 76 | Ht 65.0 in | Wt 177.8 lb

## 2013-08-06 DIAGNOSIS — I119 Hypertensive heart disease without heart failure: Secondary | ICD-10-CM

## 2013-08-06 DIAGNOSIS — Z8679 Personal history of other diseases of the circulatory system: Secondary | ICD-10-CM

## 2013-08-06 DIAGNOSIS — I498 Other specified cardiac arrhythmias: Secondary | ICD-10-CM

## 2013-08-06 DIAGNOSIS — I471 Supraventricular tachycardia: Secondary | ICD-10-CM

## 2013-08-06 DIAGNOSIS — E7800 Pure hypercholesterolemia, unspecified: Secondary | ICD-10-CM

## 2013-08-06 DIAGNOSIS — E042 Nontoxic multinodular goiter: Secondary | ICD-10-CM

## 2013-08-06 NOTE — Assessment & Plan Note (Signed)
Blood pressure is satisfactory on current therapy.  No headaches.  No dizziness or syncope.  No symptoms of CHF.

## 2013-08-06 NOTE — Assessment & Plan Note (Signed)
The patient has occasional brief palpitations lasting a few seconds but no sustained arrhythmia.  She is tolerating the higher dose of verapamil.

## 2013-08-06 NOTE — Patient Instructions (Addendum)
INCREASE CRESTOR TO 5 MG DAILY   Your physician wants you to follow-up in: 4 months with fasting labs (lp/bmet/hfp)  You will receive a reminder letter in the mail two months in advance. If you don't receive a letter, please call our office to schedule the follow-up appointment.

## 2013-08-06 NOTE — Assessment & Plan Note (Signed)
The patient has not been on careful diet recently.  Her cholesterol is significantly higher as is her triglycerides.  Her weight is unchanged.  We will have her increase her Crestor up to 5 mg every day

## 2013-08-06 NOTE — Progress Notes (Signed)
Kim Ward Date of Birth:  04/11/1931 [facility] [address] [place], [place]  [address] [phone]         Fax   [phone]  History of Present Illness: This 79 year old widowed Caucasian female is seen for a scheduled followup office visit.  She has a past history of essential hypertension and a history of mild aortic stenosis. She also has a history of hypercholesterolemia. We  saw her in February 2012 after she had an episode of rapid tachycardia palpitations at home. EMS was called but she did not have to go to the emergency room. Since then the patient has had 2 further episodes of supraventricular tachycardia. In the interim she had been switched off of the beta blocker because of concern about hair loss. She had been placed on Cardizem. After her episode of SVT she was switched once again this time to verapamil SR 180 mg daily. She has been taught how to do a Valsalva maneuver at home. She has had some mild constipation from the verapamil.  The patient had a walking Lexus scan stress test on 10/22/12 which showed no ischemia and her ejection fraction was 73%.  The patient also had an echocardiogram 10/29/12 showing an ejection fraction of 55-65% with normal systolic function and with grade 2 diastolic dysfunction and with mild aortic stenosis and mild mitral regurgitation.  In December her dose of verapamil was increased back up to 240 mg daily and she has not had any further episodes of SVT   Current Outpatient Prescriptions  Medication Sig Dispense Refill  . aspirin 81 MG tablet Take 81 mg by mouth every other day.        [name] BIOTIN PO Take 1 tablet by mouth daily.       . Coenzyme Q10 (COQ10) 100 MG capsule Take 100 mg by mouth daily.        . Cyanocobalamin (B-12 PO) Take 1 tablet by mouth daily.       [name] ibuprofen (ADVIL) 200 MG tablet Take 400 mg by mouth every 6 (six) hours as needed. For pain      . loratadine (CLARITIN) 10 MG tablet Take 10 mg by mouth  daily as needed. For allergies      . Multiple Minerals-Vitamins (CITRACAL PLUS PO) Take 1 tablet by mouth daily.       . multivitamin (THERAGRAN) per tablet Take 1 tablet by mouth daily.        . nitroGLYCERIN (NITROSTAT) 0.4 MG SL tablet Place 0.4 mg under the tongue every 5 (five) minutes as needed. For chest pain. Do not exceed 3 tablets in 15 minutes      . omeprazole (PRILOSEC) 20 MG capsule Take 20 mg by mouth daily as needed. For heartburn      . potassium chloride SA (K-DUR,KLOR-CON) 20 MEQ tablet Take 1 tablet (20 mEq total) by mouth daily.  30 tablet  5  . Pyridoxine HCl (B-6 PO) Take 1 tablet by mouth daily.       . rosuvastatin (CRESTOR) 5 MG tablet Take 5 mg by mouth daily.       [name] triamterene-hydrochlorothiazide (MAXZIDE-25) 37.5-25 MG per tablet Take 0.5 tablets by mouth daily.       . valsartan (DIOVAN) 80 MG tablet Take 40 mg by mouth daily.       . verapamil (CALAN-SR) 240 MG CR tablet TAKE 1 TABLET ONCE DAILY.  30 tablet  3  . zolpidem (AMBIEN) 10 MG tablet  Take 5 mg by mouth at bedtime as needed. For insomnia       No current facility-administered medications for this visit.    Allergies  Allergen Reactions  . Azithromycin     Or any drug that ends in "mycin"  . Bextra [Valdecoxib]     Unknown  . Lipitor [Atorvastatin Calcium]     MYALGIAS  . Ramipril Cough  . Risedronate Sodium      GI ISSUES  . Zocor [Simvastatin]     MYALGIAS  . Niaspan [Niacin Er] Rash    Patient Active Problem List   Diagnosis Date Noted  . Hypercholesteremia 02/11/2011    Priority: Medium  . Aortic stenosis, mild 02/11/2011    Priority: Medium  . Multinodular goiter 07/22/2013  . Benign hypertensive heart disease without heart failure 03/08/2013  . Elevated troponin 10/10/2012  . SVT (supraventricular tachycardia) 12/31/2011  . Hypothyroid 02/11/2011    History  Smoking status  . Former Smoker  Smokeless tobacco  . Not on file    Comment: quit June 17, 1972    History    Alcohol Use  . Yes    Comment: 2 drinks per month    No family history on file.  Review of Systems: Constitutional: no fever chills diaphoresis or fatigue or change in weight.  Head and neck: no hearing loss, no epistaxis, no photophobia or visual disturbance. Respiratory: No cough, shortness of breath or wheezing. Cardiovascular: No chest pain peripheral edema, palpitations. Gastrointestinal: No abdominal distention, no abdominal pain, no change in bowel habits hematochezia or melena. Genitourinary: No dysuria, no frequency, no urgency, no nocturia. Musculoskeletal:No arthralgias, no back pain, no gait disturbance or myalgias. Neurological: No dizziness, no headaches, no numbness, no seizures, no syncope, no weakness, no tremors. Hematologic: No lymphadenopathy, no easy bruising. Psychiatric: No confusion, no hallucinations, no sleep disturbance.    Physical Exam: Filed Vitals:   08/06/13 1004  BP: 124/60  Pulse: 76   the general appearance reveals a well-developed well-nourished elderly woman in no distress.The head and neck exam reveals pupils equal and reactive.  Extraocular movements are full.  There is no scleral icterus.  The mouth and pharynx are normal.  The neck is supple.  The carotids reveal no bruits.  The jugular venous pressure is normal.  The  thyroid is not enlarged.  There is no lymphadenopathy.  The chest is clear to percussion and auscultation.  There are no rales or rhonchi.  Expansion of the chest is symmetrical.  The precordium is quiet.  The first heart sound is normal.  The second heart sound is physiologically split.  There is no  gallop rub or click.  There is a soft systolic ejection murmur at the aortic area.  No diastolic murmur.  There is no abnormal lift or heave.  The abdomen is soft and nontender.  The bowel sounds are normal.  The liver and spleen are not enlarged.  There are no abdominal masses.  There are no abdominal bruits.  Extremities reveal good  pedal pulses.  There is no phlebitis or edema.  There is no cyanosis or clubbing.  Strength is normal and symmetrical in all extremities.  There is no lateralizing weakness.  There are no sensory deficits.  The skin is warm and dry.  There is no rash.  The EKG today shows normal sinus rhythm and is within normal limits   Assessment / Plan:  The patient is to continue same medication.  Recheck in 4 months  for followup office visit  and she will get fasting lipid panel hepatic function panel and basal metabolic panel a head of time.  Work harder on prudent diet and try to increase aerobic exercise.  She used to have an exercise bike at home but she sold it.

## 2013-08-06 NOTE — Assessment & Plan Note (Signed)
The patient reports that Dr. Lucianne Muss has taken her off Synthroid.  She had been on it for suppression of a multinodular goiter

## 2013-09-23 ENCOUNTER — Emergency Department (HOSPITAL_COMMUNITY)
Admission: EM | Admit: 2013-09-23 | Discharge: 2013-09-23 | Disposition: A | Discharge status: Home / Self Care | Payer: Medicare Other | Attending: Emergency Medicine | Admitting: Emergency Medicine

## 2013-09-23 ENCOUNTER — Encounter (HOSPITAL_COMMUNITY): Payer: Self-pay | Admitting: Emergency Medicine

## 2013-09-23 ENCOUNTER — Telehealth: Payer: Self-pay | Admitting: Cardiology

## 2013-09-23 DIAGNOSIS — Z7982 Long term (current) use of aspirin: Secondary | ICD-10-CM | POA: Insufficient documentation

## 2013-09-23 DIAGNOSIS — Z888 Allergy status to other drugs, medicaments and biological substances status: Secondary | ICD-10-CM | POA: Insufficient documentation

## 2013-09-23 DIAGNOSIS — Z87891 Personal history of nicotine dependence: Secondary | ICD-10-CM | POA: Insufficient documentation

## 2013-09-23 DIAGNOSIS — I471 Supraventricular tachycardia, unspecified: Secondary | ICD-10-CM | POA: Insufficient documentation

## 2013-09-23 DIAGNOSIS — E039 Hypothyroidism, unspecified: Secondary | ICD-10-CM | POA: Insufficient documentation

## 2013-09-23 DIAGNOSIS — R002 Palpitations: Secondary | ICD-10-CM | POA: Insufficient documentation

## 2013-09-23 DIAGNOSIS — R61 Generalized hyperhidrosis: Secondary | ICD-10-CM | POA: Insufficient documentation

## 2013-09-23 DIAGNOSIS — I10 Essential (primary) hypertension: Secondary | ICD-10-CM | POA: Insufficient documentation

## 2013-09-23 DIAGNOSIS — R0602 Shortness of breath: Secondary | ICD-10-CM | POA: Insufficient documentation

## 2013-09-23 DIAGNOSIS — I359 Nonrheumatic aortic valve disorder, unspecified: Secondary | ICD-10-CM | POA: Insufficient documentation

## 2013-09-23 DIAGNOSIS — Z881 Allergy status to other antibiotic agents status: Secondary | ICD-10-CM | POA: Insufficient documentation

## 2013-09-23 DIAGNOSIS — E7800 Pure hypercholesterolemia, unspecified: Secondary | ICD-10-CM | POA: Insufficient documentation

## 2013-09-23 DIAGNOSIS — Z79899 Other long term (current) drug therapy: Secondary | ICD-10-CM | POA: Insufficient documentation

## 2013-09-23 LAB — BASIC METABOLIC PANEL
BUN: 22 mg/dL (ref 6–23)
CO2: 23 mEq/L (ref 19–32)
Calcium: 10.3 mg/dL (ref 8.4–10.5)
Chloride: 103 mEq/L (ref 96–112)
Creatinine, Ser: 0.84 mg/dL (ref 0.50–1.10)
GFR calc Af Amer: 74 mL/min — ABNORMAL LOW (ref >90)
GFR calc non Af Amer: 64 mL/min — ABNORMAL LOW (ref >90)
Glucose, Bld: 86 mg/dL (ref 70–99)
Potassium: 4.1 mEq/L (ref 3.5–5.1)
Sodium: 139 mEq/L (ref 135–145)

## 2013-09-23 LAB — CBC
HCT: 39.6 % (ref 36.0–46.0)
MCV: 87.8 fL (ref 78.0–100.0)
Platelets: 259 10*3/uL (ref 150–400)
RDW: 13.5 % (ref 11.5–15.5)
WBC: 8.4 10*3/uL (ref 4.0–10.5)

## 2013-09-23 LAB — POCT I-STAT TROPONIN I: Troponin i, poc: 0.01 ng/mL (ref 0.00–0.08)

## 2013-09-23 MED ORDER — SODIUM CHLORIDE 0.9 % IV BOLUS (SEPSIS)
500.0000 mL | Freq: Once | INTRAVENOUS | Status: AC
  Administered 2013-09-23: 500 mL via INTRAVENOUS

## 2013-09-23 NOTE — Telephone Encounter (Signed)
New Problem  Pt called states that her heart has been "acting up" Requested an appt w/ Dr. Patty Sermons advised PA appt with scott weaver  on 11/7 @ 11:30 pt declined/// please assist

## 2013-09-23 NOTE — ED Notes (Signed)
Pt in NSR

## 2013-09-23 NOTE — ED Notes (Signed)
Pt with hx of svt became tachycardic ant 12pm.  EMS gave 6 mg of adenosine at 1332 which brought her pulse from 190 to 113.  Pt denies pain or nausea, but c/o sob.  Last episode of svt was in Dec 2013.

## 2013-09-23 NOTE — ED Provider Notes (Addendum)
CSN: [id number]     Arrival date & time 09/23/13  1400 History   First MD Initiated Contact with Patient 09/23/13 1413     Chief Complaint  Patient presents with  . svt    (Consider location/radiation/quality/duration/timing/severity/associated sxs/prior Treatment) HPI Comments: Pt is a 79 y.o. female with Pmhx as above who presents with now resolved episodes of palpitations. Pt states she began having tachy palpitations, SOB, diaphoresis around 11am at grocery store.  Symptoms c/w prior episdoes of SVT.  Symptoms no resolved w/ dive reflex or vagal maneuvers. Pt converted form likely SVT to sinus rhythm w/ 6IV adenosine by EMS.  Pt currently asymptomatic.     Patient is a 79 y.o. female presenting with palpitations.  Palpitations Palpitations quality:  Fast Onset quality:  Sudden Duration:  3 hours Timing:  Constant Progression:  Resolved Chronicity:  Recurrent Relieved by: adenosine. Worsened by:  Nothing tried Ineffective treatments:  Valsalva (diving reflex) Associated symptoms: diaphoresis and shortness of breath   Associated symptoms: no back pain, no chest pain, no cough, no hemoptysis, no nausea, no near-syncope, no numbness, no syncope, no vomiting and no weakness   Risk factors comment:  Hx SVT, aortic stenosis   Past Medical History  Diagnosis Date  . Hypertension   . SVT (supraventricular tachycardia)   . Aortic stenosis, mild   . Hypothyroidism   . Pure hypercholesterolemia    Past Surgical History  Procedure Laterality Date  . Dilation and curettage of uterus    . Tonsillectomy     No family history on file. History  Substance Use Topics  . Smoking status: Former Games developer  . Smokeless tobacco: Not on file     Comment: quit June 17, 1972  . Alcohol Use: Yes     Comment: 2 drinks per month   OB History   Grav Para Term Preterm Abortions TAB SAB Ect Mult Living                 Review of Systems  Constitutional: Positive for diaphoresis. Negative for  fever, chills, activity change, appetite change and fatigue.  HENT: Negative for congestion, facial swelling, rhinorrhea and sore throat.   Eyes: Negative for photophobia and discharge.  Respiratory: Positive for shortness of breath. Negative for cough, hemoptysis and chest tightness.   Cardiovascular: Positive for palpitations. Negative for chest pain, leg swelling, syncope and near-syncope.  Gastrointestinal: Negative for nausea, vomiting, abdominal pain and diarrhea.  Endocrine: Negative for polydipsia and polyuria.  Genitourinary: Negative for dysuria, frequency, difficulty urinating and pelvic pain.  Musculoskeletal: Negative for arthralgias, back pain, neck pain and neck stiffness.  Skin: Negative for color change and wound.  Allergic/Immunologic: Negative for immunocompromised state.  Neurological: Negative for facial asymmetry, weakness, numbness and headaches.  Hematological: Does not bruise/bleed easily.  Psychiatric/Behavioral: Negative for confusion and agitation.    Allergies  Azithromycin; Bextra; Lipitor; Ramipril; Risedronate sodium; Zocor; and Niaspan  Home Medications   Current Outpatient Rx  Name  Route  Sig  Dispense  Refill  . aspirin 81 MG tablet   Oral   Take 81 mg by mouth every other day.           [name] BIOTIN PO   Oral   Take 1 tablet by mouth daily.          . calcium-vitamin D (CALCIUM 500+D) 500-200 MG-UNIT per tablet   Oral   Take 1 tablet by mouth daily with breakfast.         .  Coenzyme Q10 (COQ10) 100 MG capsule   Oral   Take 100 mg by mouth daily.           . Cyanocobalamin (B-12 PO)   Oral   Take 1 tablet by mouth daily.          [name] ibuprofen (ADVIL) 200 MG tablet   Oral   Take 400 mg by mouth every 6 (six) hours as needed. For pain         . loratadine (CLARITIN) 10 MG tablet   Oral   Take 10 mg by mouth daily as needed. For allergies         . multivitamin (THERAGRAN) per tablet   Oral   Take 1 tablet by mouth daily.            . nitroGLYCERIN (NITROSTAT) 0.4 MG SL tablet   Sublingual   Place 0.4 mg under the tongue every 5 (five) minutes as needed. For chest pain. Do not exceed 3 tablets in 15 minutes         . omeprazole (PRILOSEC) 20 MG capsule   Oral   Take 20 mg by mouth daily as needed. For heartburn         . potassium chloride SA (K-DUR,KLOR-CON) 20 MEQ tablet   Oral   Take 1 tablet (20 mEq total) by mouth daily.   30 tablet   5   . Pyridoxine HCl (B-6 PO)   Oral   Take 1 tablet by mouth daily.          . rosuvastatin (CRESTOR) 5 MG tablet   Oral   Take 5 mg by mouth daily.          [name] triamterene-hydrochlorothiazide (MAXZIDE-25) 37.5-25 MG per tablet   Oral   Take 0.5 tablets by mouth daily.          . valsartan (DIOVAN) 80 MG tablet   Oral   Take 40 mg by mouth daily.          . verapamil (CALAN-SR) 240 MG CR tablet   Oral   Take 240 mg by mouth daily.         [name] zolpidem (AMBIEN) 10 MG tablet   Oral   Take 5 mg by mouth at bedtime as needed. For insomnia          BP 119/42  Pulse 85  Temp(Src) 98 F (36.7 C) (Oral)  Resp 17  Ht 5\' 5"  (1.651 m)  Wt 174 lb (78.926 kg)  BMI 28.96 kg/m2  SpO2 99% Physical Exam  Constitutional: She is oriented to person, place, and time. She appears well-developed and well-nourished. No distress.  HENT:  Head: Normocephalic and atraumatic.  Mouth/Throat: No oropharyngeal exudate.  Eyes: Pupils are equal, round, and reactive to light.  Neck: Normal range of motion. Neck supple.  Cardiovascular: Regular rhythm and normal heart sounds.  Tachycardia present.  Exam reveals no gallop and no friction rub.   No murmur heard. Pulmonary/Chest: Effort normal and breath sounds normal. No respiratory distress. She has no wheezes. She has no rales.  Abdominal: Soft. Bowel sounds are normal. She exhibits no distension and no mass. There is no tenderness. There is no rebound and no guarding.  Musculoskeletal: Normal range of  motion. She exhibits no edema and no tenderness.  Neurological: She is alert and oriented to person, place, and time.  Skin: Skin is warm and dry.  Psychiatric: She has a normal mood and affect.    ED  Course  Procedures (including critical care time) Labs Review Labs Reviewed  BASIC METABOLIC PANEL - Abnormal; Notable for the following:    GFR calc non Af Amer 64 (*)    GFR calc Af Amer 74 (*)    All other components within normal limits  CBC  POCT I-STAT TROPONIN I   Imaging Review No results found.  EKG Interpretation     Ventricular Rate:  112 PR Interval:  201 QRS Duration: 83 QT Interval:  330 QTC Calculation: 450 R Axis:   -2 Text Interpretation:  Sinus tachycardia Borderline prolonged PR interval No significant change since last tracing            MDM   1. SVT (supraventricular tachycardia)    Pt is a 79 y.o. female with Pmhx as above who presents with now resolved episodes of palpitations. Pt states she began having tachy palpitations, SOB, diaphoresis around 11am at grocery store.  Symptoms c/w prior episodes of SVT.  Symptoms no resolved w/ dive reflex or vagal maneuvers. Pt converted form likely SVT to sinus rhythm w/ 6IV adenosine by EMS.  Pt currently asymptomatic.  EKG sinus tachycardia with improved HR on exam.  I-stat trop negative. Pt eager to be d/c as she is feeling well.  I believe this is acceptable.  I spoke w/ Rosann Auerbach from [place] who will arrange f/u tomorrow. Return precautions given for new or worsening symptoms including return of tachycardia, CP, SOB.           Shanna Cisco, MD 09/23/13 7829  Shanna Cisco, MD 10/12/13 5621

## 2013-09-23 NOTE — Telephone Encounter (Signed)
Spoke with Pt's neighbor which states pt is having rapid A-fibrillation.  EMS was called pt is being treated now.

## 2013-09-23 NOTE — ED Notes (Signed)
Called lab RE bmp.  They stated results should be available within the next 15 minutes.  Dr Jeraldine Loots and pt notified.

## 2013-09-29 ENCOUNTER — Encounter: Payer: Self-pay | Admitting: Cardiology

## 2013-09-29 ENCOUNTER — Ambulatory Visit (INDEPENDENT_AMBULATORY_CARE_PROVIDER_SITE_OTHER): Payer: Medicare Other | Admitting: Cardiology

## 2013-09-29 VITALS — BP 150/60 | HR 80 | Ht 66.0 in | Wt 176.0 lb

## 2013-09-29 DIAGNOSIS — I359 Nonrheumatic aortic valve disorder, unspecified: Secondary | ICD-10-CM

## 2013-09-29 DIAGNOSIS — L659 Nonscarring hair loss, unspecified: Secondary | ICD-10-CM | POA: Insufficient documentation

## 2013-09-29 DIAGNOSIS — I119 Hypertensive heart disease without heart failure: Secondary | ICD-10-CM

## 2013-09-29 DIAGNOSIS — E7800 Pure hypercholesterolemia, unspecified: Secondary | ICD-10-CM

## 2013-09-29 DIAGNOSIS — I350 Nonrheumatic aortic (valve) stenosis: Secondary | ICD-10-CM

## 2013-09-29 DIAGNOSIS — I498 Other specified cardiac arrhythmias: Secondary | ICD-10-CM

## 2013-09-29 DIAGNOSIS — Z8679 Personal history of other diseases of the circulatory system: Secondary | ICD-10-CM

## 2013-09-29 DIAGNOSIS — I471 Supraventricular tachycardia: Secondary | ICD-10-CM

## 2013-09-29 DIAGNOSIS — E042 Nontoxic multinodular goiter: Secondary | ICD-10-CM

## 2013-09-29 MED ORDER — BISOPROLOL FUMARATE 5 MG PO TABS: 5.0000 mg | ORAL_TABLET | Freq: Every day | ORAL | Status: DC

## 2013-09-29 NOTE — Assessment & Plan Note (Signed)
Blood pressure has been reasonably stable on current medication.  There is mild systolic hypertension today

## 2013-09-29 NOTE — Progress Notes (Signed)
Kim Ward Date of Birth:  [birth year] [address] [place], [place]  [address] [phone]         Fax   [phone]  History of Present Illness: This 79 year old widowed Caucasian female is seen for a work in office visit.  This visit concerns a recurrence of her supraventricular tachycardia on 09/23/13  She has a past history of essential hypertension and a history of mild aortic stenosis. She also has a history of hypercholesterolemia. We  saw her in February 2012 after she had an episode of rapid tachycardia palpitations at home. EMS was called but she did not have to go to the emergency room. Since then the patient has had 2 further episodes of supraventricular tachycardia. In the interim she had been switched off of the beta blocker because of concern about hair loss. She had been placed on Cardizem. After her episode of SVT she was switched once again this time to verapamil SR 180 mg daily. She has been taught how to do a Valsalva maneuver at home. She has had some mild constipation from the verapamil.  The patient had a walking Lexus scan stress test on 10/22/12 which showed no ischemia and her ejection fraction was 73%.  The patient also had an echocardiogram 10/29/12 showing an ejection fraction of 55-65% with normal systolic function and with grade 2 diastolic dysfunction and with mild aortic stenosis and mild mitral regurgitation.  In December her dose of verapamil was increased back up to 240 mg daily and she has not had any further episodes of SVT   Current Outpatient Prescriptions  Medication Sig Dispense Refill  . aspirin 81 MG tablet Take 81 mg by mouth every other day.        [name] BIOTIN PO Take 1 tablet by mouth daily.       . calcium-vitamin D (CALCIUM 500+D) 500-200 MG-UNIT per tablet Take 1 tablet by mouth daily with breakfast.      . Cholecalciferol (VITAMIN D3) 10000 UNITS capsule Take 10,000 Units by mouth daily.      . Coenzyme Q10 (COQ10) 100 MG capsule  Take 100 mg by mouth daily.        . Cyanocobalamin (B-12 PO) Take 1 tablet by mouth daily.       [name] ibuprofen (ADVIL) 200 MG tablet Take 400 mg by mouth every 6 (six) hours as needed. For pain      . loratadine (CLARITIN) 10 MG tablet Take 10 mg by mouth daily as needed. For allergies      . multivitamin (THERAGRAN) per tablet Take 1 tablet by mouth daily.        . nitroGLYCERIN (NITROSTAT) 0.4 MG SL tablet Place 0.4 mg under the tongue every 5 (five) minutes as needed. For chest pain. Do not exceed 3 tablets in 15 minutes      . potassium chloride SA (K-DUR,KLOR-CON) 20 MEQ tablet Take 1 tablet (20 mEq total) by mouth daily.  30 tablet  5  . Pyridoxine HCl (B-6 PO) Take 1 tablet by mouth daily.       [name] triamterene-hydrochlorothiazide (MAXZIDE-25) 37.5-25 MG per tablet Take 0.5 tablets by mouth daily.       . valsartan (DIOVAN) 80 MG tablet Take 40 mg by mouth daily.       . verapamil (CALAN-SR) 240 MG CR tablet Take 240 mg by mouth daily.      [name] zolpidem (AMBIEN) 10 MG tablet Take 5 mg by mouth at  bedtime as needed. For insomnia      . bisoprolol (ZEBETA) 5 MG tablet Take 1 tablet (5 mg total) by mouth daily.  30 tablet  5   No current facility-administered medications for this visit.    Allergies  Allergen Reactions  . Azithromycin     Or any drug that ends in "mycin"  . Bextra [Valdecoxib]     Unknown  . Lipitor [Atorvastatin Calcium]     MYALGIAS  . Ramipril Cough  . Risedronate Sodium      GI ISSUES  . Zocor [Simvastatin]     MYALGIAS  . Niaspan [Niacin Er] Rash    Patient Active Problem List   Diagnosis Date Noted  . Hypercholesteremia 02/11/2011    Priority: Medium  . Aortic stenosis, mild 02/11/2011    Priority: Medium  . Multinodular goiter 07/22/2013  . Benign hypertensive heart disease without heart failure 03/08/2013  . Elevated troponin 10/10/2012  . SVT (supraventricular tachycardia) 12/31/2011  . Hypothyroid 02/11/2011    History  Smoking status  .  Former Smoker  Smokeless tobacco  . Not on file    Comment: quit June 17, 1972    History  Alcohol Use  . Yes    Comment: 2 drinks per month    No family history on file.  Review of Systems: Constitutional: no fever chills diaphoresis or fatigue or change in weight.  Head and neck: no hearing loss, no epistaxis, no photophobia or visual disturbance. Respiratory: No cough, shortness of breath or wheezing. Cardiovascular: No chest pain peripheral edema, palpitations. Gastrointestinal: No abdominal distention, no abdominal pain, no change in bowel habits hematochezia or melena. Genitourinary: No dysuria, no frequency, no urgency, no nocturia. Musculoskeletal:No arthralgias, no back pain, no gait disturbance or myalgias. Neurological: No dizziness, no headaches, no numbness, no seizures, no syncope, no weakness, no tremors. Hematologic: No lymphadenopathy, no easy bruising. Psychiatric: No confusion, no hallucinations, no sleep disturbance.    Physical Exam: Filed Vitals:   09/29/13 1450  BP: 150/60  Pulse: 80   the general appearance reveals a well-developed well-nourished elderly woman in no distress.The head and neck exam reveals pupils equal and reactive.  Extraocular movements are full.  There is no scleral icterus.  The mouth and pharynx are normal.  The neck is supple.  The carotids reveal no bruits.  The jugular venous pressure is normal.  The  thyroid is not enlarged.  There is no lymphadenopathy.  The chest is clear to percussion and auscultation.  There are no rales or rhonchi.  Expansion of the chest is symmetrical.  The precordium is quiet.  The first heart sound is normal.  The second heart sound is physiologically split.  There is no  gallop rub or click.  There is a soft systolic ejection murmur at the aortic area.  No diastolic murmur.  There is no abnormal lift or heave.  The abdomen is soft and nontender.  The bowel sounds are normal.  The liver and spleen are not  enlarged.  There are no abdominal masses.  There are no abdominal bruits.  Extremities reveal good pedal pulses.  There is no phlebitis or edema.  There is no cyanosis or clubbing.  Strength is normal and symmetrical in all extremities.  There is no lateralizing weakness.  There are no sensory deficits.  The skin is warm and dry.  There is no rash.  Recent emergency room EKG was reviewed and shows normal sinus rhythm with mild first degree AV  block.   Assessment / Plan: Continue same medication.  Head bisoprolol 5 mg one daily.  Recheck in one month for office visit and EKG.  Consider also future possible addition of low-dose digoxin.  If episodes of SVT become frequent, consider ablation.

## 2013-09-29 NOTE — Assessment & Plan Note (Signed)
She is not having any symptoms related to her mild aortic stenosis

## 2013-09-29 NOTE — Assessment & Plan Note (Signed)
The patient went into SVT while at the grocery store.  This is the same history that she experienced a year ago when she also went into SVT at the grocery store.  She denies any unusual stress related to grocery shopping.  On this occasion the SVT persisted and did not respond to Valsalva maneuver at home and she called EMS and was taken to the emergency room.  She was given adenosine.  She converted from SVT and was released home.  Blood work done in the emergency room was unremarkable and there was no evidence of myocardial damage

## 2013-09-29 NOTE — Assessment & Plan Note (Signed)
At her request we had previously stopped her bisoprolol because she was concerned that it might be causing hair loss after reading about beta blockers in Dr. Blake Divine medical column in the newspaper.  She has been off the beta blocker for about 6 months but does not think that it has made any difference in regard to her thinning of her hair.  We will restart bisoprolol 5 mg one daily in an effort to decrease the frequency of her SVT.  Her last severe episode was 11 months ago

## 2013-09-29 NOTE — Patient Instructions (Signed)
Your physician has recommended you make the following change in your medication:  1) Start Bisoprolol 5 mg daily  Your physician recommends that you schedule a follow-up appointment in: one month with Dr. Patty Sermons

## 2013-10-07 ENCOUNTER — Telehealth: Payer: Self-pay | Admitting: Cardiology

## 2013-10-07 NOTE — Telephone Encounter (Signed)
Agree with advice given. Thanks.

## 2013-10-07 NOTE — Telephone Encounter (Signed)
Pt is calling from a neighbor's house  B/c she was experiencing rapid heart rate. States she panicked & didn't think about the valsalva maneuver that Dr. Patty Sermons had taught her.  Her rapid rhythm broke on its own within a few minutes.  I have asked the pt to teach this maneuver to her neighbor/friend whom she contacted. This way the neighbor can remind the pt to do this or perform the maneuver. Pt did not want to go the ED  She would like to know from Dr. Patty Sermons if there is any other medication she might take to prevent this from happening.  Forwarded to Dr. Patty Sermons & Lane Hacker RN

## 2013-10-07 NOTE — Telephone Encounter (Signed)
New message     C/o heart is racing

## 2013-10-27 ENCOUNTER — Ambulatory Visit (INDEPENDENT_AMBULATORY_CARE_PROVIDER_SITE_OTHER): Payer: Medicare Other | Admitting: Cardiology

## 2013-10-27 ENCOUNTER — Encounter: Payer: Self-pay | Admitting: Cardiology

## 2013-10-27 VITALS — BP 122/68 | HR 61 | Ht 66.0 in | Wt 177.8 lb

## 2013-10-27 DIAGNOSIS — I359 Nonrheumatic aortic valve disorder, unspecified: Secondary | ICD-10-CM

## 2013-10-27 DIAGNOSIS — I350 Nonrheumatic aortic (valve) stenosis: Secondary | ICD-10-CM

## 2013-10-27 DIAGNOSIS — E7800 Pure hypercholesterolemia, unspecified: Secondary | ICD-10-CM

## 2013-10-27 DIAGNOSIS — I471 Supraventricular tachycardia: Secondary | ICD-10-CM

## 2013-10-27 DIAGNOSIS — I119 Hypertensive heart disease without heart failure: Secondary | ICD-10-CM

## 2013-10-27 DIAGNOSIS — Z8679 Personal history of other diseases of the circulatory system: Secondary | ICD-10-CM

## 2013-10-27 DIAGNOSIS — I498 Other specified cardiac arrhythmias: Secondary | ICD-10-CM

## 2013-10-27 NOTE — Patient Instructions (Signed)
Your physician recommends that you continue on your current medications as directed. Please refer to the Current Medication list given to you today.  Your physician wants you to follow-up in: 4 month ov/ekg  You will receive a reminder letter in the mail two months in advance. If you don't receive a letter, please call our office to schedule the follow-up appointment.  

## 2013-10-27 NOTE — Assessment & Plan Note (Signed)
Blood pressure was remaining stable on current therapy.  No dizzy spells or syncope

## 2013-10-27 NOTE — Assessment & Plan Note (Signed)
The patient is doing well on her current regimen of Zebeta and verapamil.

## 2013-10-27 NOTE — Progress Notes (Signed)
Kim Ward Date of Birth:  02/09/1931 [address] [facility] [address] [place], [place]  [address] [phone]         Fax   [phone]  History of Present Illness: This 79 year old widowed Caucasian female is seen for a scheduled followup office visit.  She had been seen in November 2014 after having a recurrence of her supraventricular tachycardia on 09/23/13  She has a past history of essential hypertension and a history of mild aortic stenosis. She also has a history of hypercholesterolemia. We  saw her in February 2012 after she had an episode of rapid tachycardia palpitations at home. EMS was called but she did not have to go to the emergency room. Since then the patient has had 2 further episodes of supraventricular tachycardia. In the interim she had been switched off of the beta blocker because of concern about hair loss. She had been placed on Cardizem. After her episode of SVT she was switched once again this time to verapamil SR 180 mg daily. She has been taught how to do a Valsalva maneuver at home. She has had some mild constipation from the verapamil.  The patient had a walking Lexus scan stress test on 10/22/12 which showed no ischemia and her ejection fraction was 73%.  The patient also had an echocardiogram 10/29/12 showing an ejection fraction of 55-65% with normal systolic function and with grade 2 diastolic dysfunction and with mild aortic stenosis and mild mitral regurgitation.  At her last visit she was put back on Zebeta 5 mg daily and she has had no further episodes   Current Outpatient Prescriptions  Medication Sig Dispense Refill  . aspirin 81 MG tablet Take 81 mg by mouth every other day.        [name] BIOTIN PO Take 1 tablet by mouth daily.       . bisoprolol (ZEBETA) 5 MG tablet Take 1 tablet (5 mg total) by mouth daily.  30 tablet  5  . calcium-vitamin D (CALCIUM 500+D) 500-200 MG-UNIT per tablet Take 1 tablet by mouth daily with breakfast.      . Cholecalciferol  (VITAMIN D3) 10000 UNITS capsule Take 10,000 Units by mouth daily.      . Coenzyme Q10 (COQ10) 100 MG capsule Take 100 mg by mouth daily.        . CRESTOR 5 MG tablet Take 5 mg by mouth daily.      . Cyanocobalamin (B-12 PO) Take 1 tablet by mouth daily.       [name] ibuprofen (ADVIL) 200 MG tablet Take 400 mg by mouth every 6 (six) hours as needed. For pain      . loratadine (CLARITIN) 10 MG tablet Take 10 mg by mouth daily as needed. For allergies      . multivitamin (THERAGRAN) per tablet Take 1 tablet by mouth daily.        . nitroGLYCERIN (NITROSTAT) 0.4 MG SL tablet Place 0.4 mg under the tongue every 5 (five) minutes as needed. For chest pain. Do not exceed 3 tablets in 15 minutes      . potassium chloride SA (K-DUR,KLOR-CON) 20 MEQ tablet Take 1 tablet (20 mEq total) by mouth daily.  30 tablet  5  . triamterene-hydrochlorothiazide (MAXZIDE-25) 37.5-25 MG per tablet Take 0.5 tablets by mouth daily.       . valsartan (DIOVAN) 80 MG tablet Take 40 mg by mouth daily.       . verapamil (CALAN-SR) 240 MG CR tablet  Take 240 mg by mouth daily.      [name] zolpidem (AMBIEN) 10 MG tablet Take 5 mg by mouth at bedtime as needed. For insomnia       No current facility-administered medications for this visit.    Allergies  Allergen Reactions  . Azithromycin     Or any drug that ends in "mycin"  . Bextra [Valdecoxib]     Unknown  . Lipitor [Atorvastatin Calcium]     MYALGIAS  . Ramipril Cough  . Risedronate Sodium      GI ISSUES  . Zocor [Simvastatin]     MYALGIAS  . Niaspan [Niacin Er] Rash    Patient Active Problem List   Diagnosis Date Noted  . Hypercholesteremia 02/11/2011    Priority: Medium  . Aortic stenosis, mild 02/11/2011    Priority: Medium  . Alopecia 09/29/2013  . Multinodular goiter 07/22/2013  . Benign hypertensive heart disease without heart failure 03/08/2013  . Elevated troponin 10/10/2012  . SVT (supraventricular tachycardia) 12/31/2011  . Hypothyroid 02/11/2011     History  Smoking status  . Former Smoker  Smokeless tobacco  . Not on file    Comment: quit June 17, 1972    History  Alcohol Use  . Yes    Comment: 2 drinks per month    No family history on file.  Review of Systems: Constitutional: no fever chills diaphoresis or fatigue or change in weight.  Head and neck: no hearing loss, no epistaxis, no photophobia or visual disturbance. Respiratory: No cough, shortness of breath or wheezing. Cardiovascular: No chest pain peripheral edema, palpitations. Gastrointestinal: No abdominal distention, no abdominal pain, no change in bowel habits hematochezia or melena. Genitourinary: No dysuria, no frequency, no urgency, no nocturia. Musculoskeletal:No arthralgias, no back pain, no gait disturbance or myalgias. Neurological: No dizziness, no headaches, no numbness, no seizures, no syncope, no weakness, no tremors. Hematologic: No lymphadenopathy, no easy bruising. Psychiatric: No confusion, no hallucinations, no sleep disturbance.    Physical Exam: Filed Vitals:   10/27/13 1454  BP: 122/68  Pulse: 61   the general appearance reveals a well-developed well-nourished elderly woman in no distress.The head and neck exam reveals pupils equal and reactive.  Extraocular movements are full.  There is no scleral icterus.  The mouth and pharynx are normal.  The neck is supple.  The carotids reveal no bruits.  The jugular venous pressure is normal.  The  thyroid is not enlarged.  There is no lymphadenopathy.  The chest is clear to percussion and auscultation.  There are no rales or rhonchi.  Expansion of the chest is symmetrical.  The precordium is quiet.  The first heart sound is normal.  The second heart sound is physiologically split.  There is no  gallop rub or click.  There is a soft systolic ejection murmur at the aortic area.  No diastolic murmur.  There is no abnormal lift or heave.  The abdomen is soft and nontender.  The bowel sounds are normal.   The liver and spleen are not enlarged.  There are no abdominal masses.  There are no abdominal bruits.  Extremities reveal good pedal pulses.  There is no phlebitis or edema.  There is no cyanosis or clubbing.  Strength is normal and symmetrical in all extremities.  There is no lateralizing weakness.  There are no sensory deficits.  The skin is warm and dry.  There is no rash.  EKG shows normal sinus rhythm and is within normal limits.  PR interval is 208 ms she is satisfactory   Assessment / Plan: Continue same medication.  Recheck in 4 months or office visit EKG and fasting lab work

## 2013-10-27 NOTE — Assessment & Plan Note (Signed)
The patient has a history of hypercholesterolemia.  She is on Crestor.  She has not had any myalgias.  We will plan to check fasting lipids at her next visit

## 2013-11-24 ENCOUNTER — Ambulatory Visit: Payer: Self-pay | Admitting: Obstetrics and Gynecology

## 2013-11-28 ENCOUNTER — Other Ambulatory Visit: Payer: Self-pay | Admitting: Cardiology

## 2013-12-01 ENCOUNTER — Ambulatory Visit: Payer: Self-pay | Admitting: Obstetrics and Gynecology

## 2013-12-21 ENCOUNTER — Other Ambulatory Visit: Payer: Self-pay | Admitting: Cardiology

## 2013-12-23 ENCOUNTER — Encounter: Payer: Self-pay | Admitting: Obstetrics and Gynecology

## 2013-12-29 ENCOUNTER — Ambulatory Visit: Payer: Self-pay | Admitting: Obstetrics and Gynecology

## 2014-02-03 ENCOUNTER — Ambulatory Visit: Payer: Self-pay | Admitting: Obstetrics and Gynecology

## 2014-02-14 ENCOUNTER — Ambulatory Visit: Payer: Medicare Other | Admitting: Obstetrics and Gynecology

## 2014-02-14 ENCOUNTER — Telehealth: Payer: Self-pay | Admitting: Obstetrics and Gynecology

## 2014-02-14 NOTE — Telephone Encounter (Signed)
Thanks

## 2014-02-14 NOTE — Telephone Encounter (Signed)
Pt had to reschedule her aex appt today because she couldn't wait any longer to be seen. Scheduled for 02/25/14.

## 2014-02-15 NOTE — Telephone Encounter (Signed)
Pt has rescheduled her appointments with notice except one time that she spoke with Kim Ward and no reason in computer why. Yesterday she just couldn't wait any longer. Kim Ward thinks because of her age there is really nothing we can do at this point.

## 2014-02-23 ENCOUNTER — Other Ambulatory Visit (INDEPENDENT_AMBULATORY_CARE_PROVIDER_SITE_OTHER): Payer: Medicare Other

## 2014-02-23 DIAGNOSIS — I119 Hypertensive heart disease without heart failure: Secondary | ICD-10-CM

## 2014-02-23 LAB — HEPATIC FUNCTION PANEL
ALBUMIN: 3.9 g/dL (ref 3.5–5.2)
ALT: 18 U/L (ref 0–35)
AST: 20 U/L (ref 0–37)
Alkaline Phosphatase: 54 U/L (ref 39–117)
BILIRUBIN DIRECT: 0 mg/dL (ref 0.0–0.3)
Total Bilirubin: 0.8 mg/dL (ref 0.3–1.2)
Total Protein: 6.8 g/dL (ref 6.0–8.3)

## 2014-02-23 LAB — BASIC METABOLIC PANEL
BUN: 18 mg/dL (ref 6–23)
CALCIUM: 9.3 mg/dL (ref 8.4–10.5)
CO2: 28 mEq/L (ref 19–32)
Chloride: 103 mEq/L (ref 96–112)
Creatinine, Ser: 0.9 mg/dL (ref 0.4–1.2)
GFR: 65.52 mL/min (ref >60.00)
Glucose, Bld: 93 mg/dL (ref 70–99)
Potassium: 4.1 mEq/L (ref 3.5–5.1)
Sodium: 138 mEq/L (ref 135–145)

## 2014-02-23 LAB — LIPID PANEL
CHOL/HDL RATIO: 4
Cholesterol: 212 mg/dL — ABNORMAL HIGH (ref 0–200)
HDL: 48.7 mg/dL (ref >39.00)
LDL Cholesterol: 111 mg/dL — ABNORMAL HIGH (ref 0–99)
TRIGLYCERIDES: 262 mg/dL — AB (ref 0.0–149.0)
VLDL: 52.4 mg/dL — ABNORMAL HIGH (ref 0.0–40.0)

## 2014-02-23 NOTE — Progress Notes (Signed)
Quick Note:  Please make copy of labs for patient visit. ______ 

## 2014-02-25 ENCOUNTER — Ambulatory Visit: Payer: Medicare Other | Admitting: Obstetrics and Gynecology

## 2014-03-01 ENCOUNTER — Encounter: Payer: Self-pay | Admitting: Cardiology

## 2014-03-01 ENCOUNTER — Ambulatory Visit (INDEPENDENT_AMBULATORY_CARE_PROVIDER_SITE_OTHER): Payer: Medicare Other | Admitting: Cardiology

## 2014-03-01 VITALS — BP 126/50 | HR 64 | Ht 68.0 in | Wt 179.0 lb

## 2014-03-01 DIAGNOSIS — I359 Nonrheumatic aortic valve disorder, unspecified: Secondary | ICD-10-CM

## 2014-03-01 DIAGNOSIS — I119 Hypertensive heart disease without heart failure: Secondary | ICD-10-CM

## 2014-03-01 DIAGNOSIS — E7800 Pure hypercholesterolemia, unspecified: Secondary | ICD-10-CM

## 2014-03-01 DIAGNOSIS — I471 Supraventricular tachycardia: Secondary | ICD-10-CM

## 2014-03-01 DIAGNOSIS — I498 Other specified cardiac arrhythmias: Secondary | ICD-10-CM

## 2014-03-01 DIAGNOSIS — Z8679 Personal history of other diseases of the circulatory system: Secondary | ICD-10-CM

## 2014-03-01 DIAGNOSIS — I350 Nonrheumatic aortic (valve) stenosis: Secondary | ICD-10-CM

## 2014-03-01 DIAGNOSIS — K5900 Constipation, unspecified: Secondary | ICD-10-CM

## 2014-03-01 NOTE — Assessment & Plan Note (Signed)
Patient is on Crestor for her hypercholesterolemia.  She is tolerating it without side effects.  Lipids are improving.

## 2014-03-01 NOTE — Progress Notes (Signed)
Kim Ward Date of Birth:  Nov 26, 1930 [address] [place], [place]  [address] [phone]         Fax   [phone]  History of Present Illness: This 80 year old widowed Caucasian female is seen for a scheduled followup office visit.  She had been seen in November 2014 after having a recurrence of her supraventricular tachycardia on 09/23/13  She has a past history of essential hypertension and a history of mild aortic stenosis. She also has a history of hypercholesterolemia. We  saw her in February 2012 after she had an episode of rapid tachycardia palpitations at home. EMS was called but she did not have to go to the emergency room. Since then the patient has had 2 further episodes of supraventricular tachycardia. In the interim she had been switched off of the beta blocker because of concern about hair loss. She had been placed on Cardizem. After her episode of SVT she was switched once again this time to verapamil SR 180 mg daily. She has been taught how to do a Valsalva maneuver at home. She has had some mild constipation from the verapamil.  The patient had a walking Lexus scan stress test on 10/22/12 which showed no ischemia and her ejection fraction was 73%.  The patient also had an echocardiogram 10/29/12 showing an ejection fraction of 55-65% with normal systolic function and with grade 2 diastolic dysfunction and with mild aortic stenosis and mild mitral regurgitation.  At her last visit she was put back on Zebeta 5 mg daily and she has had no further episodes.   Current Outpatient Prescriptions  Medication Sig Dispense Refill  . aspirin 81 MG tablet Take 81 mg by mouth every other day.        [name] BIOTIN PO Take 1 tablet by mouth daily.       . bisoprolol (ZEBETA) 5 MG tablet Take 1 tablet (5 mg total) by mouth daily.  30 tablet  5  . calcium-vitamin D (CALCIUM 500+D) 500-200 MG-UNIT per tablet Take 1 tablet by mouth daily with breakfast.      . Cholecalciferol  (VITAMIN D3) 10000 UNITS capsule Take 10,000 Units by mouth daily.      . Coenzyme Q10 (COQ10) 100 MG capsule Take 100 mg by mouth daily.        . CRESTOR 5 MG tablet Take 5 mg by mouth daily.      . Cyanocobalamin (B-12 PO) Take 1 tablet by mouth daily.       [name] HYDROCHLOROTHIAZIDE PO Take by mouth.      [name] ibuprofen (ADVIL) 200 MG tablet Take 400 mg by mouth every 6 (six) hours as needed. For pain      . loratadine (CLARITIN) 10 MG tablet Take 10 mg by mouth daily as needed. For allergies      . multivitamin (THERAGRAN) per tablet Take 1 tablet by mouth daily.        . nitroGLYCERIN (NITROSTAT) 0.4 MG SL tablet Place 0.4 mg under the tongue every 5 (five) minutes as needed. For chest pain. Do not exceed 3 tablets in 15 minutes      . Omega-3 Fatty Acids (SALMON OIL-1000 PO) Take by mouth.      . potassium chloride SA (K-DUR,KLOR-CON) 20 MEQ tablet TAKE 1 TABLET ONCE DAILY.  30 tablet  1  . triamterene-hydrochlorothiazide (MAXZIDE-25) 37.5-25 MG per tablet Take 0.5 tablets by mouth daily.       . valsartan (  DIOVAN) 80 MG tablet Take 40 mg by mouth daily.       . verapamil (CALAN-SR) 240 MG CR tablet TAKE 1 TABLET ONCE DAILY.  30 tablet  3  . zolpidem (AMBIEN) 10 MG tablet Take 5 mg by mouth at bedtime as needed. For insomnia       No current facility-administered medications for this visit.    Allergies  Allergen Reactions  . Augmentin [Amoxicillin-Pot Clavulanate]   . Azithromycin     Or any drug that ends in "mycin"  . Bextra [Valdecoxib]     Unknown  . Humibid La [Guaifenesin] Other (See Comments)    Mouth sores  . Lipitor [Atorvastatin Calcium]     MYALGIAS  . Ramipril Cough  . Risedronate Sodium      GI ISSUES  . Zocor [Simvastatin]     MYALGIAS  . Niaspan [Niacin Er] Rash    Patient Active Problem List   Diagnosis Date Noted  . Hypercholesteremia 02/11/2011    Priority: Medium  . Aortic stenosis, mild 02/11/2011    Priority: Medium  . Constipation 03/01/2014  .  Alopecia 09/29/2013  . Multinodular goiter 07/22/2013  . Benign hypertensive heart disease without heart failure 03/08/2013  . Elevated troponin 10/10/2012  . SVT (supraventricular tachycardia) 12/31/2011  . Hypothyroid 02/11/2011    History  Smoking status  . Former Smoker  Smokeless tobacco  . Not on file    Comment: quit June 17, 1972    History  Alcohol Use  . Yes    Comment: 2 drinks per month    Family History  Problem Relation Age of Onset  . Breast cancer Sister     Review of Systems: Constitutional: no fever chills diaphoresis or fatigue or change in weight.  Head and neck: no hearing loss, no epistaxis, no photophobia or visual disturbance. Respiratory: No cough, shortness of breath or wheezing. Cardiovascular: No chest pain peripheral edema, palpitations. Gastrointestinal: No abdominal distention, no abdominal pain, no change in bowel habits hematochezia or melena. Genitourinary: No dysuria, no frequency, no urgency, no nocturia. Musculoskeletal:No arthralgias, no back pain, no gait disturbance or myalgias. Neurological: No dizziness, no headaches, no numbness, no seizures, no syncope, no weakness, no tremors. Hematologic: No lymphadenopathy, no easy bruising. Psychiatric: No confusion, no hallucinations, no sleep disturbance.    Physical Exam: Filed Vitals:   03/01/14 1143  BP: 126/50  Pulse: 64   the general appearance reveals a well-developed well-nourished elderly woman in no distress.The head and neck exam reveals pupils equal and reactive.  Extraocular movements are full.  There is no scleral icterus.  The mouth and pharynx are normal.  The neck is supple.  The carotids reveal no bruits.  The jugular venous pressure is normal.  The  thyroid is not enlarged.  There is no lymphadenopathy.  The chest is clear to percussion and auscultation.  There are no rales or rhonchi.  Expansion of the chest is symmetrical.  The precordium is quiet.  The first heart  sound is normal.  The second heart sound is physiologically split.  There is no  gallop rub or click.  There is a soft systolic ejection murmur at the aortic area.  No diastolic murmur.  There is no abnormal lift or heave.  The abdomen is soft and nontender.  The bowel sounds are normal.  The liver and spleen are not enlarged.  There are no abdominal masses.  There are no abdominal bruits.  Extremities reveal good pedal pulses.  There is no phlebitis or edema.  There is no cyanosis or clubbing.  Strength is normal and symmetrical in all extremities.  There is no lateralizing weakness.  There are no sensory deficits.  The skin is warm and dry.  There is no rash.     Assessment / Plan: Continue same medication.  MiraLax for constipation.  Continue to watch diet carefully and to avoid carbohydrates and starches.  Her weight is up 2 pounds since last visit. Recheck in 4 months or office visit EKG and fasting lab work

## 2014-03-01 NOTE — Assessment & Plan Note (Signed)
She is having some ongoing constipation problems related to the verapamil.  She is already taking Colace.  She will add MiraLax when necessary

## 2014-03-01 NOTE — Patient Instructions (Signed)
Your physician recommends that you continue on your current medications as directed. Please refer to the Current Medication list given to you today.  Your physician wants you to follow-up in: 4 months with fasting labs (lp/bmet/hfp) Marthann Schiller You will receive a reminder letter in the mail two months in advance. If you don't receive a letter, please call our office to schedule the follow-up appointment.

## 2014-03-01 NOTE — Assessment & Plan Note (Signed)
She has not had any further episodes of sustained SVT.  In the past if she has an episode while she is home she splashed is ice cold water in her face and it will break the SVT.

## 2014-03-01 NOTE — Assessment & Plan Note (Signed)
Blood pressure has been remaining stable on current therapy.  No chest pain.  No shortness of breath.  No dizziness or syncope.

## 2014-03-09 ENCOUNTER — Encounter: Payer: Self-pay | Admitting: Obstetrics and Gynecology

## 2014-03-09 ENCOUNTER — Ambulatory Visit (INDEPENDENT_AMBULATORY_CARE_PROVIDER_SITE_OTHER): Payer: Medicare Other | Admitting: Obstetrics and Gynecology

## 2014-03-09 VITALS — BP 122/66 | HR 64 | Resp 16 | Ht 68.0 in | Wt 178.6 lb

## 2014-03-09 DIAGNOSIS — Z01419 Encounter for gynecological examination (general) (routine) without abnormal findings: Secondary | ICD-10-CM

## 2014-03-09 NOTE — Patient Instructions (Signed)

## 2014-03-09 NOTE — Progress Notes (Signed)
Patient ID: Kim Ward, female   DOB: [birth year], 80 y.o.   MRN: [id number] GYNECOLOGY VISIT  PCP:   Deland Pretty, MD  Referring provider:   HPI: 80 y.o.   Widowed  Caucasian  female   [place] with Patient's last menstrual period was 11/19/1983.   here for   AEX.  Constipation.  Taking Colace bid.  Took Miralax last night.   Having external itching.  No discharge. Occurs form time to time.  Has contact dermatitis. No pad use.  Used hydrocortisone cream.   Daughter with metastatic breast cancer.  Helps to care for her.   Hgb:    PCP Urine:  PCP  GYNECOLOGIC HISTORY: Patient's last menstrual period was 11/19/1983. Sexually active:  no Partner preference: female Contraception:  Postmenopausal  Menopausal hormone therapy: no DES exposure:  no  Blood transfusions: no   Sexually transmitted diseases:   no GYN procedures and prior surgeries:  D & C, LEEP procedure 1996 revealed CIN I.  Paps normal since. Last mammogram:   02-16-13 wnl:[facility].            Last pap and high risk HPV testing:  01-12-10 wnl  History of abnormal pap smear:  Hx abnormal pap 1996 with LEEP procedure revealing CIN I.  Paps normal since.   OB History   Grav Para Term Preterm Abortions TAB SAB Ect Mult Living   5 3        3        LIFESTYLE: Exercise:    no           Tobacco:   no Alcohol:      Occasional glas of wine Drug use:    no  OTHER HEALTH MAINTENANCE: Tetanus/TDap:   08/2013 Gardisil:               n/a Influenza:             08/2013 Zostavax:             Completed with PCP  Bone density:      08/2013 with Dr. Pennie Banter office: osteopenia Colonoscopy:       04/2012 normal with Dr. Cristina Gong and will no longer need screening colonoscopy.  Cholesterol check:  Normal with medication.  Family History  Problem Relation Age of Onset  . Stroke Sister   . Breast cancer Daughter 13    metastatic breast ca    Patient Active Problem List   Diagnosis Date Noted  . Constipation 03/01/2014   . Alopecia 09/29/2013  . Multinodular goiter 07/22/2013  . Benign hypertensive heart disease without heart failure 03/08/2013  . Elevated troponin 10/10/2012  . SVT (supraventricular tachycardia) 12/31/2011  . Hypercholesteremia 02/11/2011  . Hypothyroid 02/11/2011  . Aortic stenosis, mild 02/11/2011   Past Medical History  Diagnosis Date  . Hypertension   . SVT (supraventricular tachycardia)   . Aortic stenosis, mild   . Pure hypercholesterolemia   . Osteopenia   . Hyperlipidemia   . Diverticulosis   . Adenomatous colon polyp   . Depression     Tx'd 19 yrs ago when husband passed away  . Heart murmur   . Hypothyroidism     Dr. Dwyane Dee follows pt.--had small nodules but had decreased in size and stopped Synthroid 06/2013  . Osteoporosis     Past Surgical History  Procedure Laterality Date  . Dilation and curettage of uterus    . Tonsillectomy    . Colonoscopy  03/2001, 01/2007  2008 - no polyps  . Cervical biopsy  w/ loop electrode excision  1996    CIN I    ALLERGIES: Augmentin; Azithromycin; Bextra; Humibid la; Lipitor; Ramipril; Risedronate sodium; Zocor; and Niaspan  Current Outpatient Prescriptions  Medication Sig Dispense Refill  . aspirin 81 MG tablet Take 81 mg by mouth every other day.        [name] BIOTIN PO Take 1 tablet by mouth daily.       . bisoprolol (ZEBETA) 5 MG tablet Take 1 tablet (5 mg total) by mouth daily.  30 tablet  5  . calcium-vitamin D (CALCIUM 500+D) 500-200 MG-UNIT per tablet Take 1 tablet by mouth daily with breakfast.      . Cholecalciferol (VITAMIN D3) 10000 UNITS capsule Take 10,000 Units by mouth daily. Takes 1000 units daily (not 10,000 units)      . Coenzyme Q10 (COQ10) 100 MG capsule Take 100 mg by mouth daily.        . CRESTOR 5 MG tablet Take 5 mg by mouth daily.      . Cyanocobalamin (B-12 PO) Take 1 tablet by mouth daily.       [name] HYDROCHLOROTHIAZIDE PO Take by mouth.      [name] ibuprofen (ADVIL) 200 MG tablet Take 400 mg by mouth  every 6 (six) hours as needed. For pain      . loratadine (CLARITIN) 10 MG tablet Take 10 mg by mouth daily as needed. For allergies      . multivitamin (THERAGRAN) per tablet Take 1 tablet by mouth daily.        . nitroGLYCERIN (NITROSTAT) 0.4 MG SL tablet Place 0.4 mg under the tongue every 5 (five) minutes as needed. For chest pain. Do not exceed 3 tablets in 15 minutes      . Omega-3 Fatty Acids (SALMON OIL-1000 PO) Take by mouth.      . potassium chloride SA (K-DUR,KLOR-CON) 20 MEQ tablet TAKE 1 TABLET ONCE DAILY.  30 tablet  1  . triamterene-hydrochlorothiazide (MAXZIDE-25) 37.5-25 MG per tablet Take 0.5 tablets by mouth daily.       . valsartan (DIOVAN) 80 MG tablet Take 40 mg by mouth daily.       . verapamil (CALAN-SR) 240 MG CR tablet TAKE 1 TABLET ONCE DAILY.  30 tablet  3  . zolpidem (AMBIEN) 10 MG tablet Take 5 mg by mouth at bedtime as needed. For insomnia       No current facility-administered medications for this visit.     ROS:  Pertinent items are noted in HPI.  SOCIAL HISTORY:  Retired Education officer, [employer].   PHYSICAL EXAMINATION:    BP 122/66  Pulse 64  Resp 16  Ht 5\' 8"  (1.727 m)  Wt 178 lb 9.6 oz (81.012 kg)  BMI 27.16 kg/m2  LMP 11/19/1983   Wt Readings from Last 3 Encounters:  03/09/14 178 lb 9.6 oz (81.012 kg)  03/01/14 179 lb (81.194 kg)  10/27/13 177 lb 12.8 oz (80.65 kg)     Ht Readings from Last 3 Encounters:  03/09/14 5\' 8"  (1.727 m)  03/01/14 5\' 8"  (1.727 m)  10/27/13 5\' 6"  (1.676 m)    General appearance: alert, cooperative and appears stated age Head: Normocephalic, without obvious abnormality, atraumatic Neck: no adenopathy, supple, symmetrical, trachea midline and thyroid not enlarged, symmetric, no tenderness/mass/nodules Lungs: clear to auscultation bilaterally Breasts: Inspection negative, No nipple retraction or dimpling, No nipple discharge or bleeding, No axillary or supraclavicular adenopathy, Normal to palpation  without dominant  masses Heart: regular rate and rhythm Abdomen: soft, non-tender; no masses,  no organomegaly Extremities: extremities normal, atraumatic, no cyanosis or edema Skin: Skin color, texture, turgor normal. No rashes or lesions Lymph nodes: Cervical, supraclavicular, and axillary nodes normal. No abnormal inguinal nodes palpated Neurologic: Grossly normal  Pelvic: External genitalia:  no lesions              Urethra:  normal appearing urethra with no masses, tenderness or lesions              Bartholins and Skenes: normal                 Vagina: normal appearing vagina with normal color and discharge, no lesions              Cervix: normal appearance              Pap: yes.            Bimanual Exam:  Uterus:  uterus is normal size, shape, consistency and nontender                                      Adnexa: normal adnexa in size, nontender and no masses                                      Rectovaginal: Confirms                                      Anus:  normal sphincter tone, no lesions  ASSESSMENT  Normal gynecologic exam. History of LEEP. Osteopenia.  Family history of daughter with breast cancer.   PLAN  Mammogram recommended yearly.  Pap smear. Counseled on self breast exam, Calcium and vitamin D intake, exercise. Return annually or prn   An After Visit Summary was printed and given to the patient.

## 2014-03-11 LAB — IPS PAP SMEAR ONLY

## 2014-03-11 NOTE — Telephone Encounter (Signed)
Patient is calling Kim Ward back about a conversation they had on wed. At her appt.

## 2014-03-29 ENCOUNTER — Other Ambulatory Visit: Payer: Self-pay

## 2014-03-29 ENCOUNTER — Other Ambulatory Visit: Payer: Self-pay | Admitting: Cardiology

## 2014-03-29 DIAGNOSIS — Z1231 Encounter for screening mammogram for malignant neoplasm of breast: Secondary | ICD-10-CM

## 2014-04-01 ENCOUNTER — Other Ambulatory Visit: Payer: Self-pay | Admitting: Cardiology

## 2014-04-14 ENCOUNTER — Ambulatory Visit
Admission: RE | Admit: 2014-04-14 | Discharge: 2014-04-14 | Disposition: A | Discharge status: Home / Self Care | Payer: Medicare Other | Source: Ambulatory Visit

## 2014-04-14 ENCOUNTER — Encounter (INDEPENDENT_AMBULATORY_CARE_PROVIDER_SITE_OTHER): Payer: Self-pay

## 2014-04-14 DIAGNOSIS — Z1231 Encounter for screening mammogram for malignant neoplasm of breast: Secondary | ICD-10-CM

## 2014-06-24 ENCOUNTER — Other Ambulatory Visit (INDEPENDENT_AMBULATORY_CARE_PROVIDER_SITE_OTHER): Payer: Medicare Other

## 2014-06-24 DIAGNOSIS — E7800 Pure hypercholesterolemia, unspecified: Secondary | ICD-10-CM

## 2014-06-24 DIAGNOSIS — I119 Hypertensive heart disease without heart failure: Secondary | ICD-10-CM

## 2014-06-24 LAB — BASIC METABOLIC PANEL
BUN: 19 mg/dL (ref 6–23)
CALCIUM: 9 mg/dL (ref 8.4–10.5)
CO2: 26 mEq/L (ref 19–32)
CREATININE: 0.9 mg/dL (ref 0.4–1.2)
Chloride: 103 mEq/L (ref 96–112)
GFR: 63.79 mL/min (ref >60.00)
Glucose, Bld: 86 mg/dL (ref 70–99)
Potassium: 3.8 mEq/L (ref 3.5–5.1)
Sodium: 137 mEq/L (ref 135–145)

## 2014-06-24 LAB — LIPID PANEL
CHOLESTEROL: 220 mg/dL — AB (ref 0–200)
HDL: 45 mg/dL (ref >39.00)
NONHDL: 175
TRIGLYCERIDES: 328 mg/dL — AB (ref 0.0–149.0)
Total CHOL/HDL Ratio: 5
VLDL: 65.6 mg/dL — ABNORMAL HIGH (ref 0.0–40.0)

## 2014-06-24 LAB — HEPATIC FUNCTION PANEL
ALBUMIN: 3.9 g/dL (ref 3.5–5.2)
ALK PHOS: 59 U/L (ref 39–117)
ALT: 17 U/L (ref 0–35)
AST: 24 U/L (ref 0–37)
Bilirubin, Direct: 0 mg/dL (ref 0.0–0.3)
TOTAL PROTEIN: 6.6 g/dL (ref 6.0–8.3)
Total Bilirubin: 0.8 mg/dL (ref 0.2–1.2)

## 2014-06-24 LAB — LDL CHOLESTEROL, DIRECT: LDL DIRECT: 140.2 mg/dL

## 2014-06-27 NOTE — Progress Notes (Signed)
Quick Note:  Please make copy of labs for patient visit. ______ 

## 2014-06-29 ENCOUNTER — Ambulatory Visit (INDEPENDENT_AMBULATORY_CARE_PROVIDER_SITE_OTHER): Payer: Medicare Other | Admitting: Cardiology

## 2014-06-29 ENCOUNTER — Encounter: Payer: Self-pay | Admitting: Cardiology

## 2014-06-29 VITALS — BP 124/76 | HR 59 | Ht 65.0 in | Wt 175.8 lb

## 2014-06-29 DIAGNOSIS — I350 Nonrheumatic aortic (valve) stenosis: Secondary | ICD-10-CM

## 2014-06-29 DIAGNOSIS — I498 Other specified cardiac arrhythmias: Secondary | ICD-10-CM

## 2014-06-29 DIAGNOSIS — E7800 Pure hypercholesterolemia, unspecified: Secondary | ICD-10-CM

## 2014-06-29 DIAGNOSIS — I119 Hypertensive heart disease without heart failure: Secondary | ICD-10-CM

## 2014-06-29 DIAGNOSIS — I359 Nonrheumatic aortic valve disorder, unspecified: Secondary | ICD-10-CM

## 2014-06-29 DIAGNOSIS — I471 Supraventricular tachycardia: Secondary | ICD-10-CM

## 2014-06-29 MED ORDER — NITROGLYCERIN 0.4 MG SL SUBL: 0.4000 mg | SUBLINGUAL_TABLET | SUBLINGUAL | Status: DC | PRN

## 2014-06-29 NOTE — Patient Instructions (Signed)
Your physician recommends that you continue on your current medications as directed. Please refer to the Current Medication list given to you today.  Your physician recommends that you schedule a follow-up appointment in: [place]

## 2014-06-29 NOTE — Assessment & Plan Note (Signed)
No recurrence of SVT since last visit.

## 2014-06-29 NOTE — Assessment & Plan Note (Signed)
No symptoms from her mild aortic stenosis

## 2014-06-29 NOTE — Assessment & Plan Note (Signed)
Her lipids are higher.  We will have her work harder on careful diet and exercise.  Recheck fasting lab work in 4 months.

## 2014-06-29 NOTE — Progress Notes (Signed)
Kim Ward Date of Birth:  1931-07-12 [place] [address] [place], [place]  [address] [phone]        Fax   [phone]   History of Present Illness: This 80 year old widowed Caucasian female is seen for a scheduled followup office visit. She had been seen in November 2014 after having a recurrence of her supraventricular tachycardia on 09/23/13 She has a past history of essential hypertension and a history of mild aortic stenosis. She also has a history of hypercholesterolemia. We saw her in February 2012 after she had an episode of rapid tachycardia palpitations at home. EMS was called but she did not have to go to the emergency room. Since then the patient has had 2 further episodes of supraventricular tachycardia. In the interim she had been switched off of the beta blocker because of concern about hair loss. She had been placed on Cardizem. After her episode of SVT she was switched once again this time to verapamil SR 180 mg daily. She has been taught how to do a Valsalva maneuver at home. She has had some mild constipation from the verapamil.  The patient had a walking Lexus scan stress test on 10/22/12 which showed no ischemia and her ejection fraction was 73%.  The patient also had an echocardiogram 10/29/12 showing an ejection fraction of 55-65% with normal systolic function and with grade 2 diastolic dysfunction and with mild aortic stenosis and mild mitral regurgitation. At her last visit she was put back on Zebeta 5 mg daily and she has had no further episodes. Since last visit her daughter died 2 weeks ago.  Her daughter has had metastatic breast cancer for 9 years. Current Outpatient Prescriptions  Medication Sig Dispense Refill  . aspirin 81 MG tablet Take 81 mg by mouth every other day.        [name] BIOTIN PO Take 1 tablet by mouth daily.       . bisoprolol (ZEBETA) 5 MG tablet TAKE 1 TABLET ONCE DAILY.  30 tablet  3  . calcium-vitamin D (CALCIUM  500+D) 500-200 MG-UNIT per tablet Take 1 tablet by mouth daily with breakfast.      . Cholecalciferol (VITAMIN D3) 10000 UNITS capsule Take 10,000 Units by mouth daily. Takes 1000 units daily (not 10,000 units)      . Coenzyme Q10 (COQ10) 100 MG capsule Take 100 mg by mouth daily.        . CRESTOR 5 MG tablet Take 5 mg by mouth daily.      . Cyanocobalamin (B-12 PO) Take 1 tablet by mouth daily.       [name] ibuprofen (ADVIL) 200 MG tablet Take 400 mg by mouth every 6 (six) hours as needed. For pain      . loratadine (CLARITIN) 10 MG tablet Take 10 mg by mouth daily as needed. For allergies      . multivitamin (THERAGRAN) per tablet Take 1 tablet by mouth daily.        . nitroGLYCERIN (NITROSTAT) 0.4 MG SL tablet Place 1 tablet (0.4 mg total) under the tongue every 5 (five) minutes as needed. For chest pain. Do not exceed 3 tablets in 15 minutes  25 tablet  prn  . Omega-3 Fatty Acids (SALMON OIL-1000 PO) Take by mouth.      . potassium chloride SA (K-DUR,KLOR-CON) 20 MEQ tablet TAKE 1 TABLET ONCE DAILY.  30 tablet  3  . triamterene-hydrochlorothiazide (MAXZIDE-25) 37.5-25 MG per tablet Take 0.5  tablets by mouth daily.       . valsartan (DIOVAN) 80 MG tablet Take 40 mg by mouth daily.       . verapamil (CALAN-SR) 240 MG CR tablet TAKE 1 TABLET ONCE DAILY.  30 tablet  6  . zolpidem (AMBIEN) 10 MG tablet Take 5 mg by mouth at bedtime as needed. For insomnia       No current facility-administered medications for this visit.    Allergies  Allergen Reactions  . Augmentin [Amoxicillin-Pot Clavulanate]   . Azithromycin     Or any drug that ends in "mycin"  . Bextra [Valdecoxib]     Unknown  . Humibid La [Guaifenesin] Other (See Comments)    Mouth sores  . Lipitor [Atorvastatin Calcium]     MYALGIAS  . Ramipril Cough  . Risedronate Sodium      GI ISSUES  . Zocor [Simvastatin]     MYALGIAS  . Niaspan [Niacin Er] Rash    Patient Active Problem List   Diagnosis Date Noted  . Hypercholesteremia  02/11/2011    Priority: Medium  . Aortic stenosis, mild 02/11/2011    Priority: Medium  . Constipation 03/01/2014  . Alopecia 09/29/2013  . Multinodular goiter 07/22/2013  . Benign hypertensive heart disease without heart failure 03/08/2013  . Elevated troponin 10/10/2012  . SVT (supraventricular tachycardia) 12/31/2011  . Hypothyroid 02/11/2011    History  Smoking status  . Former Smoker  Smokeless tobacco  . Not on file    Comment: quit June 17, 1972    History  Alcohol Use  . Yes    Comment: 2 drinks per month    Family History  Problem Relation Age of Onset  . Stroke Sister   . Breast cancer Daughter 49    metastatic breast ca    Review of Systems: Constitutional: no fever chills diaphoresis or fatigue or change in weight.  Head and neck: no hearing loss, no epistaxis, no photophobia or visual disturbance. Respiratory: No cough, shortness of breath or wheezing. Cardiovascular: No chest pain peripheral edema, palpitations. Gastrointestinal: No abdominal distention, no abdominal pain, no change in bowel habits hematochezia or melena. Genitourinary: No dysuria, no frequency, no urgency, no nocturia. Musculoskeletal:No arthralgias, no back pain, no gait disturbance or myalgias. Neurological: No dizziness, no headaches, no numbness, no seizures, no syncope, no weakness, no tremors. Hematologic: No lymphadenopathy, no easy bruising. Psychiatric: No confusion, no hallucinations, no sleep disturbance.    Physical Exam: Filed Vitals:   06/29/14 1019  BP: 124/76  Pulse: 59   the general appearance reveals a well-developed well-nourished woman in no distress.  She is grieving about her daughter who died 2 weeks ago.The head and neck exam reveals pupils equal and reactive.  Extraocular movements are full.  There is no scleral icterus.  The mouth and pharynx are normal.  The neck is supple.  The carotids reveal no bruits.  The jugular venous pressure is normal.  The   thyroid is not enlarged.  There is no lymphadenopathy.  The chest is clear to percussion and auscultation.  There are no rales or rhonchi.  Expansion of the chest is symmetrical.  The precordium is quiet.  The first heart sound is normal.  The second heart sound is physiologically split.  There is no murmur gallop rub or click.  There is no abnormal lift or heave.  The abdomen is soft and nontender.  The bowel sounds are normal.  The liver and spleen are not enlarged.  There are no abdominal masses.  There are no abdominal bruits.  Extremities reveal good pedal pulses.  There is no phlebitis or edema.  There is no cyanosis or clubbing.  Strength is normal and symmetrical in all extremities.  There is no lateralizing weakness.  There are no sensory deficits.  The skin is warm and dry.  There is no rash.  EKG today shows sinus bradycardia 59 per minute and otherwise within normal limits   Assessment / Plan: 1. essential hypertension 2. history of paroxysmal SVT 3. mild aortic stenosis 4. Hypercholesterolemia  Plan: Continue same medication.  We refilled her sublingual nitroglycerin which she keeps on hand.  Return in 4 months for office visit and fasting lipid panel hepatic function panel and basal metabolic panel

## 2014-07-01 ENCOUNTER — Telehealth: Payer: Self-pay | Admitting: *Deleted

## 2014-07-01 NOTE — Telephone Encounter (Signed)
Recent labs reviewed by  Dr. Mare Ferrari (did not go over at [facility] yesterday as planned). Work harder on diet and exercise, continue same dose of medications. Advised patient and mailed copy

## 2014-07-18 ENCOUNTER — Other Ambulatory Visit: Payer: Medicare Other

## 2014-07-18 ENCOUNTER — Telehealth: Payer: Self-pay | Admitting: Endocrinology

## 2014-07-18 NOTE — Telephone Encounter (Signed)
Cassey from [place] stated that patient need lab order put in.

## 2014-07-21 ENCOUNTER — Ambulatory Visit: Payer: Medicare Other | Admitting: Endocrinology

## 2014-08-12 ENCOUNTER — Other Ambulatory Visit (INDEPENDENT_AMBULATORY_CARE_PROVIDER_SITE_OTHER): Payer: Medicare Other

## 2014-08-12 ENCOUNTER — Other Ambulatory Visit: Payer: Self-pay

## 2014-08-12 DIAGNOSIS — E039 Hypothyroidism, unspecified: Secondary | ICD-10-CM

## 2014-08-12 LAB — TSH: TSH: 0.6 u[IU]/mL (ref 0.35–4.50)

## 2014-08-12 LAB — T4, FREE: Free T4: 1.1 ng/dL (ref 0.60–1.60)

## 2014-08-19 ENCOUNTER — Encounter: Payer: Self-pay | Admitting: Endocrinology

## 2014-08-19 ENCOUNTER — Ambulatory Visit (INDEPENDENT_AMBULATORY_CARE_PROVIDER_SITE_OTHER): Payer: Medicare Other | Admitting: Endocrinology

## 2014-08-19 VITALS — BP 126/54 | HR 63 | Temp 98.3°F | Resp 16 | Ht 65.0 in | Wt 177.6 lb

## 2014-08-19 DIAGNOSIS — Z23 Encounter for immunization: Secondary | ICD-10-CM

## 2014-08-19 DIAGNOSIS — E042 Nontoxic multinodular goiter: Secondary | ICD-10-CM

## 2014-08-19 NOTE — Progress Notes (Signed)
Patient ID: Kim Ward, female   DOB: [birth year], 80 y.o.   MRN: [id number]   Reason for Appointment:  Multinodular goiter, followup visit    History of Present Illness:   She has been followed by myself for her goiter since 1993. She has a long-standing multinodular goiter since at least 50.  For quite sometime she had been on thyroid suppression with 75 mcg Levoxyl.  No history of hypothyroidism  Needle aspiration was done in 1994 on the nodule in the medial right lobe and was benign. She does not feel the nodule and has no local discomfort        As the goiter had become significantly smaller in the last couple of years she was told to stop her Synthroid in 2014   Currently she appears to be still quite euthyroid without any Synthroid         No visits with results within 1 Week(s) from this visit. Latest known visit with results is:  Appointment on 08/12/2014  Component Date Value Ref Range Status  . Free T4 08/12/2014 1.10  0.60 - 1.60 ng/dL Final  . TSH 08/12/2014 0.60  0.35 - 4.50 uIU/mL Final      Medication List       This list is accurate as of: 08/19/14  3:17 PM.  Always use your most recent med list.               ADVIL 200 MG tablet  Generic drug:  ibuprofen  Take 400 mg by mouth every 6 (six) hours as needed. For pain     AMBIEN 10 MG tablet  Generic drug:  zolpidem  Take 5 mg by mouth at bedtime as needed. For insomnia     aspirin 81 MG tablet  Take 81 mg by mouth every other day.     augmented betamethasone dipropionate 0.05 % cream  Commonly known as:  DIPROLENE-AF     B-12 PO  Take 1 tablet by mouth daily.     BIOTIN PO  Take 1 tablet by mouth daily.     bisoprolol 5 MG tablet  Commonly known as:  ZEBETA  TAKE 1 TABLET ONCE DAILY.     CALCIUM 500+D 500-200 MG-UNIT per tablet  Generic drug:  calcium-vitamin D  Take 1 tablet by mouth daily with breakfast.     Coenzyme Q10 100 MG capsule  Take 100 mg by mouth daily.     CRESTOR 5 MG  tablet  Generic drug:  rosuvastatin  Take 5 mg by mouth daily.     loratadine 10 MG tablet  Commonly known as:  CLARITIN  Take 10 mg by mouth daily as needed. For allergies     multivitamin per tablet  Take 1 tablet by mouth daily.     nitroGLYCERIN 0.4 MG SL tablet  Commonly known as:  NITROSTAT  Place 1 tablet (0.4 mg total) under the tongue every 5 (five) minutes as needed. For chest pain. Do not exceed 3 tablets in 15 minutes     potassium chloride SA 20 MEQ tablet  Commonly known as:  K-DUR,KLOR-CON  TAKE 1 TABLET ONCE DAILY.     SALMON OIL-1000 PO  Take by mouth.     triamterene-hydrochlorothiazide 37.5-25 MG per tablet  Commonly known as:  MAXZIDE-25  Take 0.5 tablets by mouth daily.     valsartan 80 MG tablet  Commonly known as:  DIOVAN  Take 40 mg by mouth daily.     verapamil  240 MG CR tablet  Commonly known as:  CALAN-SR  TAKE 1 TABLET ONCE DAILY.     Vitamin D3 10000 UNITS capsule  Take 10,000 Units by mouth daily. Takes 1000 units daily (not 10,000 units)        Past Medical History  Diagnosis Date  . Hypertension   . SVT (supraventricular tachycardia)   . Aortic stenosis, mild   . Pure hypercholesterolemia   . Osteopenia   . Hyperlipidemia   . Diverticulosis   . Adenomatous colon polyp   . Depression     Tx'd 19 yrs ago when husband passed away  . Heart murmur   . Hypothyroidism     Dr. Dwyane Dee follows pt.--had small nodules but had decreased in size and stopped Synthroid 06/2013  . Osteoporosis     Past Surgical History  Procedure Laterality Date  . Dilation and curettage of uterus    . Tonsillectomy    . Colonoscopy  03/2001, 01/2007    2008 - no polyps  . Cervical biopsy  w/ loop electrode excision  1996    CIN I    Family History  Problem Relation Age of Onset  . Stroke Sister   . Breast cancer Daughter 25    metastatic breast ca    Social History:  reports that she has quit smoking. She does not have any smokeless tobacco  history on file. She reports that she drinks alcohol. She reports that she does not use illicit drugs.  Allergies:  Allergies  Allergen Reactions  . Augmentin [Amoxicillin-Pot Clavulanate]   . Azithromycin     Or any drug that ends in "mycin"  . Bextra [Valdecoxib]     Unknown  . Humibid La [Guaifenesin] Other (See Comments)    Mouth sores  . Lipitor [Atorvastatin Calcium]     MYALGIAS  . Ramipril Cough  . Risedronate Sodium      GI ISSUES  . Zocor [Simvastatin]     MYALGIAS  . Niaspan [Niacin Er] Rash     Examination:   BP 126/54  Pulse 63  Temp(Src) 98.3 F (36.8 C)  Resp 16  Ht 5\' 5"  (1.651 m)  Wt 177 lb 9.6 oz (80.559 kg)  BMI 29.55 kg/m2  SpO2 94%  LMP 11/19/1983           NECK: no clear enlargement of the thyroid felt on the right lobe, isthmus feels normal.  A firm nodule is palpable on swallowing on the left middle lobe area and is about 1.5-2 cm.  No lymphadenopathy in the neck            Assessments   Long-standing multinodular goiter, has residual small left-sided nodule which is unchanged since 2014. Right side is not palpable and her initial palpable nodule is not palpable now  Most likely her goiter has regressed spontaneously and  There is no sign of recurrence with leaving off her thyroid suppression for a year  She can have followup annually with her PCP unless he specifically wants me to see her Patient is agreeable to this plan  Kim Ward 08/19/2014, 3:17 PM

## 2014-09-19 ENCOUNTER — Encounter: Payer: Self-pay | Admitting: Endocrinology

## 2014-10-24 ENCOUNTER — Telehealth: Payer: Self-pay | Admitting: Cardiology

## 2014-10-24 DIAGNOSIS — E7800 Pure hypercholesterolemia, unspecified: Secondary | ICD-10-CM

## 2014-10-24 NOTE — Telephone Encounter (Signed)
Scheduled labs for patient as requested

## 2014-10-24 NOTE — Telephone Encounter (Signed)
New message         Pt is requesting labs to be done prior to her appt with brackbill / please give pt a call

## 2014-10-27 ENCOUNTER — Other Ambulatory Visit (INDEPENDENT_AMBULATORY_CARE_PROVIDER_SITE_OTHER): Payer: Medicare Other | Admitting: *Deleted

## 2014-10-27 DIAGNOSIS — E7800 Pure hypercholesterolemia, unspecified: Secondary | ICD-10-CM

## 2014-10-27 DIAGNOSIS — I350 Nonrheumatic aortic (valve) stenosis: Secondary | ICD-10-CM

## 2014-10-27 DIAGNOSIS — I471 Supraventricular tachycardia: Secondary | ICD-10-CM

## 2014-10-27 LAB — HEPATIC FUNCTION PANEL
ALBUMIN: 4.1 g/dL (ref 3.5–5.2)
ALT: 25 U/L (ref 0–35)
AST: 25 U/L (ref 0–37)
Alkaline Phosphatase: 63 U/L (ref 39–117)
BILIRUBIN DIRECT: 0.1 mg/dL (ref 0.0–0.3)
TOTAL PROTEIN: 6.8 g/dL (ref 6.0–8.3)
Total Bilirubin: 0.7 mg/dL (ref 0.2–1.2)

## 2014-10-27 LAB — BASIC METABOLIC PANEL
BUN: 16 mg/dL (ref 6–23)
CO2: 24 mEq/L (ref 19–32)
Calcium: 9.3 mg/dL (ref 8.4–10.5)
Chloride: 104 mEq/L (ref 96–112)
Creatinine, Ser: 0.9 mg/dL (ref 0.4–1.2)
GFR: 63.73 mL/min (ref >60.00)
Glucose, Bld: 96 mg/dL (ref 70–99)
Potassium: 4 mEq/L (ref 3.5–5.1)
Sodium: 136 mEq/L (ref 135–145)

## 2014-10-27 LAB — LIPID PANEL
CHOL/HDL RATIO: 4
Cholesterol: 214 mg/dL — ABNORMAL HIGH (ref 0–200)
HDL: 49.3 mg/dL (ref >39.00)
LDL CALC: 131 mg/dL — AB (ref 0–99)
NonHDL: 164.7
Triglycerides: 168 mg/dL — ABNORMAL HIGH (ref 0.0–149.0)
VLDL: 33.6 mg/dL (ref 0.0–40.0)

## 2014-10-30 NOTE — Progress Notes (Signed)
Quick Note:  Please make copy of labs for patient visit. ______ 

## 2014-11-01 ENCOUNTER — Encounter: Payer: Self-pay | Admitting: Cardiology

## 2014-11-01 ENCOUNTER — Ambulatory Visit (INDEPENDENT_AMBULATORY_CARE_PROVIDER_SITE_OTHER): Payer: Medicare Other | Admitting: Cardiology

## 2014-11-01 VITALS — BP 128/76 | HR 75 | Ht 65.0 in | Wt 178.0 lb

## 2014-11-01 DIAGNOSIS — I350 Nonrheumatic aortic (valve) stenosis: Secondary | ICD-10-CM

## 2014-11-01 DIAGNOSIS — E7800 Pure hypercholesterolemia, unspecified: Secondary | ICD-10-CM

## 2014-11-01 DIAGNOSIS — I471 Supraventricular tachycardia: Secondary | ICD-10-CM

## 2014-11-01 DIAGNOSIS — I119 Hypertensive heart disease without heart failure: Secondary | ICD-10-CM

## 2014-11-01 NOTE — Assessment & Plan Note (Signed)
No recent episodes of SVT on bisoprolol and verapamil.

## 2014-11-01 NOTE — Progress Notes (Signed)
Kim Ward Date of Birth:  1931-10-11 [place] [address] [place], [place]  [address] [phone]        Fax   [phone]   History of Present Illness: This 80 year old widowed Caucasian female is seen for a scheduled followup office visit.  She has a past history of hypercholesterolemia, essential hypertension, and supraventricular tachycardia.  She does not have any history of ischemic heart disease.  The patient had a walking Lexus scan stress test on 10/22/12 which showed no ischemia and her ejection fraction was 73%.  The patient also had an echocardiogram 10/29/12 showing an ejection fraction of 55-65% with normal systolic function and with grade 2 diastolic dysfunction and with mild aortic stenosis and mild mitral regurgitation.  Since last visit the patient has not had any significant episodes of SVT Patient is a widow.  Her husband died of colon cancer at age 24.  The patient had 3 children.  One of her daughters died earlier this year of metastatic breast cancer.  Her other children are a daughter living in [place] and a son living in [place]. Current Outpatient Prescriptions  Medication Sig Dispense Refill  . aspirin 81 MG tablet Take 81 mg by mouth every other day.      . augmented betamethasone dipropionate (DIPROLENE-AF) 0.05 % cream     . BIOTIN PO Take 1 tablet by mouth daily.     . bisoprolol (ZEBETA) 5 MG tablet TAKE 1 TABLET ONCE DAILY. 30 tablet 3  . calcium-vitamin D (CALCIUM 500+D) 500-200 MG-UNIT per tablet Take 1 tablet by mouth daily with breakfast.    . Cholecalciferol (VITAMIN D3) 10000 UNITS capsule Take 10,000 Units by mouth daily. Takes 1000 units daily (not 10,000 units)    . Coenzyme Q10 (COQ10) 100 MG capsule Take 100 mg by mouth daily.      . CRESTOR 5 MG tablet Take 5 mg by mouth daily.    . Cyanocobalamin (B-12 PO) Take 1 tablet by mouth daily.     [name] ibuprofen (ADVIL) 200 MG tablet Take 400 mg by mouth every 6 (six)  hours as needed. For pain    . loratadine (CLARITIN) 10 MG tablet Take 10 mg by mouth daily as needed. For allergies    . multivitamin (THERAGRAN) per tablet Take 1 tablet by mouth daily.      . nitroGLYCERIN (NITROSTAT) 0.4 MG SL tablet Place 1 tablet (0.4 mg total) under the tongue every 5 (five) minutes as needed. For chest pain. Do not exceed 3 tablets in 15 minutes 25 tablet prn  . Omega-3 Fatty Acids (SALMON OIL-1000 PO) Take by mouth.    . potassium chloride SA (K-DUR,KLOR-CON) 20 MEQ tablet TAKE 1 TABLET ONCE DAILY. 30 tablet 3  . triamterene-hydrochlorothiazide (MAXZIDE-25) 37.5-25 MG per tablet Take 0.5 tablets by mouth daily.     . valsartan (DIOVAN) 80 MG tablet Take 40 mg by mouth daily.     . verapamil (CALAN-SR) 240 MG CR tablet TAKE 1 TABLET ONCE DAILY. 30 tablet 6  . zolpidem (AMBIEN) 10 MG tablet Take 5 mg by mouth at bedtime as needed. For insomnia     No current facility-administered medications for this visit.    Allergies  Allergen Reactions  . Augmentin [Amoxicillin-Pot Clavulanate]   . Azithromycin     Or any drug that ends in "mycin"  . Bextra [Valdecoxib]     Unknown  . Humibid La [Guaifenesin] Other (See Comments)  Mouth sores  . Lipitor [Atorvastatin Calcium]     MYALGIAS  . Ramipril Cough  . Risedronate Sodium      GI ISSUES  . Zocor [Simvastatin]     MYALGIAS  . Niaspan [Niacin Er] Rash    Patient Active Problem List   Diagnosis Date Noted  . Hypercholesteremia 02/11/2011    Priority: Medium  . Aortic stenosis, mild 02/11/2011    Priority: Medium  . Constipation 03/01/2014  . Alopecia 09/29/2013  . Multinodular goiter 07/22/2013  . Benign hypertensive heart disease without heart failure 03/08/2013  . Elevated troponin 10/10/2012  . SVT (supraventricular tachycardia) 12/31/2011  . Hypothyroid 02/11/2011    History  Smoking status  . Former Smoker  Smokeless tobacco  . Not on file    Comment: quit June 17, 1972    History  Alcohol  Use  . Yes    Comment: 2 drinks per month    Family History  Problem Relation Age of Onset  . Stroke Sister   . Breast cancer Daughter 85    metastatic breast ca    Review of Systems: Constitutional: no fever chills diaphoresis or fatigue or change in weight.  Head and neck: no hearing loss, no epistaxis, no photophobia or visual disturbance. Respiratory: No cough, shortness of breath or wheezing. Cardiovascular: No chest pain peripheral edema, palpitations. Gastrointestinal: No abdominal distention, no abdominal pain, no change in bowel habits hematochezia or melena. Genitourinary: No dysuria, no frequency, no urgency, no nocturia. Musculoskeletal:No arthralgias, no back pain, no gait disturbance or myalgias. Neurological: No dizziness, no headaches, no numbness, no seizures, no syncope, no weakness, no tremors. Hematologic: No lymphadenopathy, no easy bruising. Psychiatric: No confusion, no hallucinations, no sleep disturbance.    Physical Exam: Filed Vitals:   11/01/14 1057  BP: 128/76  Pulse: 75   the general appearance reveals a well-developed well-nourished woman in no distress.  She is grieving about her daughter who died 2 weeks ago.The head and neck exam reveals pupils equal and reactive.  Extraocular movements are full.  There is no scleral icterus.  The mouth and pharynx are normal.  The neck is supple.  The carotids reveal no bruits.  The jugular venous pressure is normal.  The  thyroid is not enlarged.  There is no lymphadenopathy.  The chest is clear to percussion and auscultation.  There are no rales or rhonchi.  Expansion of the chest is symmetrical.  The precordium is quiet.  The first heart sound is normal.  The second heart sound is physiologically split.  There is no murmur gallop rub or click.  There is no abnormal lift or heave.  The abdomen is soft and nontender.  The bowel sounds are normal.  The liver and spleen are not enlarged.  There are no abdominal masses.   There are no abdominal bruits.  Extremities reveal good pedal pulses.  There is no phlebitis or edema.  There is no cyanosis or clubbing.  Strength is normal and symmetrical in all extremities.  There is no lateralizing weakness.  There are no sensory deficits.  The skin is warm and dry.  There is no rash.     Assessment / Plan: 1. essential hypertension 2. history of paroxysmal SVT 3. mild aortic stenosis 4. Hypercholesterolemia, improved since last visit despite 1 pound weight gain.  The patient does not get any regular aerobic exercise.  She enjoys playing bridge.  Plan: Continue same medication.  At her last visit we refilled her sublingual  nitroglycerin which she keeps on hand.  She has not had to use it.  Return in 4 months for office visit and fasting lipid panel hepatic function panel and basal metabolic panel

## 2014-11-01 NOTE — Assessment & Plan Note (Signed)
Blood pressure has been remaining stable on current therapy. 

## 2014-11-01 NOTE — Assessment & Plan Note (Signed)
No symptoms of heart failure or other symptoms from her aortic valve disease

## 2014-11-01 NOTE — Patient Instructions (Signed)
Your physician recommends that you continue on your current medications as directed. Please refer to the Current Medication list given to you today.  Your physician wants you to follow-up in: 4 months with fasting labs (lp/bmet/hfp) You will receive a reminder letter in the mail two months in advance. If you don't receive a letter, please call our office to schedule the follow-up appointment.  

## 2014-11-28 ENCOUNTER — Other Ambulatory Visit: Payer: Self-pay | Admitting: Cardiology

## 2014-12-04 ENCOUNTER — Other Ambulatory Visit: Payer: Self-pay | Admitting: Cardiology

## 2014-12-13 ENCOUNTER — Other Ambulatory Visit: Payer: Self-pay | Admitting: Cardiology

## 2015-02-17 ENCOUNTER — Ambulatory Visit: Payer: Medicare Other | Admitting: Obstetrics and Gynecology

## 2015-03-06 ENCOUNTER — Other Ambulatory Visit (INDEPENDENT_AMBULATORY_CARE_PROVIDER_SITE_OTHER): Payer: Medicare Other | Admitting: *Deleted

## 2015-03-06 DIAGNOSIS — E7800 Pure hypercholesterolemia, unspecified: Secondary | ICD-10-CM

## 2015-03-06 DIAGNOSIS — I119 Hypertensive heart disease without heart failure: Secondary | ICD-10-CM | POA: Diagnosis not present

## 2015-03-06 LAB — BASIC METABOLIC PANEL
BUN: 22 mg/dL (ref 6–23)
CALCIUM: 9.7 mg/dL (ref 8.4–10.5)
CO2: 27 meq/L (ref 19–32)
CREATININE: 0.96 mg/dL (ref 0.40–1.20)
Chloride: 105 mEq/L (ref 96–112)
GFR: 59.11 mL/min — AB (ref >60.00)
GLUCOSE: 90 mg/dL (ref 70–99)
Potassium: 4 mEq/L (ref 3.5–5.1)
Sodium: 137 mEq/L (ref 135–145)

## 2015-03-06 LAB — LIPID PANEL
CHOL/HDL RATIO: 4
Cholesterol: 184 mg/dL (ref 0–200)
HDL: 50.2 mg/dL (ref >39.00)
LDL Cholesterol: 95 mg/dL (ref 0–99)
NonHDL: 133.8
TRIGLYCERIDES: 193 mg/dL — AB (ref 0.0–149.0)
VLDL: 38.6 mg/dL (ref 0.0–40.0)

## 2015-03-06 LAB — HEPATIC FUNCTION PANEL
ALT: 19 U/L (ref 0–35)
AST: 21 U/L (ref 0–37)
Albumin: 4.3 g/dL (ref 3.5–5.2)
Alkaline Phosphatase: 62 U/L (ref 39–117)
BILIRUBIN DIRECT: 0.1 mg/dL (ref 0.0–0.3)
BILIRUBIN TOTAL: 0.7 mg/dL (ref 0.2–1.2)
Total Protein: 6.9 g/dL (ref 6.0–8.3)

## 2015-03-06 NOTE — Progress Notes (Signed)
Quick Note:  Please make copy of labs for patient visit. ______ 

## 2015-03-08 ENCOUNTER — Ambulatory Visit: Payer: Self-pay | Admitting: Cardiology

## 2015-03-13 ENCOUNTER — Other Ambulatory Visit: Payer: Self-pay

## 2015-03-13 DIAGNOSIS — Z1231 Encounter for screening mammogram for malignant neoplasm of breast: Secondary | ICD-10-CM

## 2015-03-15 ENCOUNTER — Telehealth: Payer: Self-pay | Admitting: Obstetrics and Gynecology

## 2015-03-15 ENCOUNTER — Ambulatory Visit: Payer: Self-pay | Admitting: Obstetrics and Gynecology

## 2015-03-15 ENCOUNTER — Ambulatory Visit: Payer: Medicare Other | Admitting: Obstetrics and Gynecology

## 2015-03-15 NOTE — Telephone Encounter (Signed)
Patient canceled her appointment for today. She has some type of virus bug. She will call back to reschedule. Patient is in recall.

## 2015-03-22 ENCOUNTER — Encounter: Payer: Self-pay | Admitting: Cardiology

## 2015-03-22 ENCOUNTER — Ambulatory Visit (INDEPENDENT_AMBULATORY_CARE_PROVIDER_SITE_OTHER): Payer: Medicare Other | Admitting: Cardiology

## 2015-03-22 VITALS — BP 118/60 | HR 60 | Ht 65.0 in | Wt 180.8 lb

## 2015-03-22 DIAGNOSIS — E7800 Pure hypercholesterolemia, unspecified: Secondary | ICD-10-CM

## 2015-03-22 DIAGNOSIS — M159 Polyosteoarthritis, unspecified: Secondary | ICD-10-CM

## 2015-03-22 DIAGNOSIS — I119 Hypertensive heart disease without heart failure: Secondary | ICD-10-CM

## 2015-03-22 DIAGNOSIS — Z8679 Personal history of other diseases of the circulatory system: Secondary | ICD-10-CM

## 2015-03-22 DIAGNOSIS — M150 Primary generalized (osteo)arthritis: Secondary | ICD-10-CM

## 2015-03-22 NOTE — Progress Notes (Signed)
Cardiology Office Note   Date:  03/22/2015   ID:  Kim Ward, DOB Oct 15, 1931, MRN [id number]  PCP:  Horatio Pel, MD  Cardiologist: Darlin Coco MD  No chief complaint on file.     History of Present Illness: Kim Ward is a 81 y.o. female who presents for a four-month follow-up office visit.  This 81 year old widowed Caucasian female is seen for a scheduled followup office visit. She has a past history of hypercholesterolemia, essential hypertension, and supraventricular tachycardia. She does not have any history of ischemic heart disease.  The patient had a walking Lexus scan stress test on 10/22/12 which showed no ischemia and her ejection fraction was 73%.  The patient also had an echocardiogram 10/29/12 showing an ejection fraction of 55-65% with normal systolic function and with grade 2 diastolic dysfunction and with mild aortic stenosis and mild mitral regurgitation. Since last visit the patient has not had any significant episodes of SVT Patient is a widow. Her husband died of colon cancer at age 69. The patient had 3 children. One of her daughters died earlier this year of metastatic breast cancer. Her other children are a daughter living in [place] and a son living in [place]. [place] we last saw her [place]  Past Medical History  Diagnosis Date  . Hypertension   . SVT (supraventricular tachycardia)   . Aortic stenosis, mild   . Pure hypercholesterolemia   . Osteopenia   . Hyperlipidemia   . Diverticulosis   . Adenomatous colon polyp   . Depression     Tx'd 19 yrs ago when husband passed away  . Heart murmur   . Hypothyroidism     Dr. Dwyane Dee follows pt.--had small nodules but had decreased in size and stopped Synthroid 06/2013  . Osteoporosis     Past Surgical History  Procedure Laterality Date  . Dilation and curettage of uterus    . Tonsillectomy    . Colonoscopy  03/2001, 01/2007    2008 - no polyps    . Cervical biopsy  w/ loop electrode excision  1996    CIN I     Current Outpatient Prescriptions  Medication Sig Dispense Refill  . aspirin 81 MG tablet Take 81 mg by mouth every other day.      [name] BIOTIN PO Take 1 tablet by mouth daily.     . bisoprolol (ZEBETA) 5 MG tablet Take 5 mg by mouth daily.    . calcium-vitamin D (CALCIUM 500+D) 500-200 MG-UNIT per tablet Take 1 tablet by mouth daily with breakfast.    . Cholecalciferol (VITAMIN D3) 10000 UNITS capsule Take 10,000 Units by mouth daily. Takes 1000 units daily (not 10,000 units)    . Coenzyme Q10 (COQ10) 100 MG capsule Take 100 mg by mouth daily.      . CRESTOR 5 MG tablet Take 5 mg by mouth daily.    . Cyanocobalamin (B-12 PO) Take 1 tablet by mouth daily.     [name] ibuprofen (ADVIL) 200 MG tablet Take 400 mg by mouth every 6 (six) hours as needed. For pain    . loratadine (CLARITIN) 10 MG tablet Take 10 mg by mouth daily as needed. For allergies    . multivitamin (THERAGRAN) per tablet Take 1 tablet by mouth daily.      . nitroGLYCERIN (NITROSTAT) 0.4 MG SL tablet Place 1 tablet (0.4 mg total) under the tongue every 5 (five) minutes as needed. For  chest pain. Do not exceed 3 tablets in 15 minutes 25 tablet prn  . Omega-3 Fatty Acids (SALMON OIL-1000 PO) Take 1 capsule by mouth daily.     . potassium chloride SA (K-DUR,KLOR-CON) 20 MEQ tablet Take 20 mEq by mouth 2 (two) times daily.    [name] triamterene-hydrochlorothiazide (MAXZIDE-25) 37.5-25 MG per tablet Take 0.5 tablets by mouth daily.     . valsartan (DIOVAN) 80 MG tablet Take 40 mg by mouth daily.     . verapamil (CALAN-SR) 240 MG CR tablet Take 240 mg by mouth at bedtime.    [name] zolpidem (AMBIEN) 10 MG tablet Take 5 mg by mouth at bedtime as needed. For insomnia     No current facility-administered medications for this visit.    Allergies:   Augmentin; Azithromycin; Bextra; Cefuroxime; Cefuroxime axetil; Humibid la; Lipitor; Ramipril; Risedronate sodium; Zocor; and Niaspan     Social History:  The patient  reports that she has quit smoking. She does not have any smokeless tobacco history on file. She reports that she drinks alcohol. She reports that she does not use illicit drugs.   Family History:  The patient's family history includes Breast cancer (age of onset: 58) in her daughter; Stroke in her sister.    ROS:  Please see the history of present illness.   Otherwise, review of systems are positive for none.   All other systems are reviewed and negative.    PHYSICAL EXAM: VS:  BP 118/60 mmHg  Pulse 60  Ht '5\' 5"'$  (1.651 m)  Wt 180 lb 12.8 oz (82.01 kg)  BMI 30.09 kg/m2  LMP 11/19/1983 , BMI Body mass index is 30.09 kg/(m^2). GEN: Well nourished, well developed, in no acute distress HEENT: normal Neck: no JVD, carotid bruits, or masses Cardiac: RRR; no murmurs, rubs, or gallops,no edema  Respiratory:  clear to auscultation bilaterally, normal work of breathing GI: soft, nontender, nondistended, + BS MS: no deformity or atrophy Skin: warm and dry, no rash Neuro:  Strength and sensation are intact Psych: euthymic mood, full affect   EKG:  EKG is not ordered today.   Recent Labs: 08/12/2014: TSH 0.60 03/06/2015: ALT 19; BUN 22; Creatinine 0.96; Potassium 4.0; Sodium 137    Lipid Panel    Component Value Date/Time   CHOL 184 03/06/2015 0910   TRIG 193.0* 03/06/2015 0910   HDL 50.20 03/06/2015 0910   CHOLHDL 4 03/06/2015 0910   VLDL 38.6 03/06/2015 0910   LDLCALC 95 03/06/2015 0910   LDLDIRECT 140.2 06/24/2014 0843      Wt Readings from Last 3 Encounters:  03/22/15 180 lb 12.8 oz (82.01 kg)  11/01/14 178 lb (80.74 kg)  08/19/14 177 lb 9.6 oz (80.559 kg)         ASSESSMENT AND PLAN:  1. essential hypertension 2. history of paroxysmal SVT--she has had no recent episodes since last visit 3. mild aortic stenosis 4. Hypercholesterolemia, improved since last visit despite 2 pound weight gain. The patient does not get any regular  aerobic exercise. She enjoys playing bridge. 5.  Osteoarthritis of both thumbs.  She is followed by Dr. Amedeo Plenty. 6.  Past history of acid reflux when taking nonsteroidals such as Advil  Plan: Continue same medication.  Return in 4 months for office visit and fasting lipid panel hepatic function panel and basal metabolic panel   Current medicines are reviewed at length with the patient today.  The patient does not have concerns regarding medicines.  The following changes have been  made:  no change  Labs/ tests ordered today include:   Orders Placed This Encounter  Procedures  . Lipid panel  . Hepatic function panel  . Basic metabolic panel      Signed, Darlin Coco MD 03/22/2015 1:04 PM    [facility] [address], [place], [place]  [address] Phone: [phone]; Fax: [phone]

## 2015-03-22 NOTE — Patient Instructions (Addendum)
Medication Instructions:  Your physician recommends that you continue on your current medications as directed. Please refer to the Current Medication list given to you today.  Labwork: none  Testing/Procedures: none  Follow-Up: Your physician wants you to follow-up in: 4 months with fasting labs (lp/bmet/hfp)

## 2015-04-18 ENCOUNTER — Ambulatory Visit
Admission: RE | Admit: 2015-04-18 | Discharge: 2015-04-18 | Disposition: A | Discharge status: Home / Self Care | Payer: Medicare Other | Source: Ambulatory Visit

## 2015-04-18 ENCOUNTER — Other Ambulatory Visit: Payer: Self-pay | Admitting: Cardiology

## 2015-04-18 DIAGNOSIS — Z1231 Encounter for screening mammogram for malignant neoplasm of breast: Secondary | ICD-10-CM

## 2015-04-24 ENCOUNTER — Other Ambulatory Visit: Payer: Self-pay | Admitting: Cardiology

## 2015-04-25 NOTE — Telephone Encounter (Signed)
Per note 5.11.16

## 2015-07-19 ENCOUNTER — Other Ambulatory Visit (INDEPENDENT_AMBULATORY_CARE_PROVIDER_SITE_OTHER): Payer: Medicare Other | Admitting: *Deleted

## 2015-07-19 ENCOUNTER — Other Ambulatory Visit: Payer: Self-pay | Admitting: *Deleted

## 2015-07-19 DIAGNOSIS — E7800 Pure hypercholesterolemia, unspecified: Secondary | ICD-10-CM

## 2015-07-19 DIAGNOSIS — I119 Hypertensive heart disease without heart failure: Secondary | ICD-10-CM

## 2015-07-19 DIAGNOSIS — E039 Hypothyroidism, unspecified: Secondary | ICD-10-CM | POA: Diagnosis not present

## 2015-07-19 DIAGNOSIS — I471 Supraventricular tachycardia, unspecified: Secondary | ICD-10-CM

## 2015-07-19 DIAGNOSIS — I350 Nonrheumatic aortic (valve) stenosis: Secondary | ICD-10-CM

## 2015-07-19 LAB — BASIC METABOLIC PANEL
BUN: 24 mg/dL — ABNORMAL HIGH (ref 6–23)
CHLORIDE: 104 meq/L (ref 96–112)
CO2: 27 mEq/L (ref 19–32)
Calcium: 9.4 mg/dL (ref 8.4–10.5)
Creatinine, Ser: 0.96 mg/dL (ref 0.40–1.20)
GFR: 59.05 mL/min — ABNORMAL LOW (ref >60.00)
Glucose, Bld: 93 mg/dL (ref 70–99)
POTASSIUM: 4 meq/L (ref 3.5–5.1)
SODIUM: 139 meq/L (ref 135–145)

## 2015-07-19 LAB — HEPATIC FUNCTION PANEL
ALBUMIN: 4.1 g/dL (ref 3.5–5.2)
ALK PHOS: 64 U/L (ref 39–117)
ALT: 18 U/L (ref 0–35)
AST: 19 U/L (ref 0–37)
Bilirubin, Direct: 0.1 mg/dL (ref 0.0–0.3)
TOTAL PROTEIN: 6.9 g/dL (ref 6.0–8.3)
Total Bilirubin: 0.5 mg/dL (ref 0.2–1.2)

## 2015-07-19 LAB — LIPID PANEL
CHOL/HDL RATIO: 4
Cholesterol: 195 mg/dL (ref 0–200)
HDL: 49.7 mg/dL (ref >39.00)
LDL CALC: 105 mg/dL — AB (ref 0–99)
NonHDL: 145.42
TRIGLYCERIDES: 200 mg/dL — AB (ref 0.0–149.0)
VLDL: 40 mg/dL (ref 0.0–40.0)

## 2015-07-19 NOTE — Addendum Note (Signed)
Addended by: Eulis Foster on: 07/19/2015 08:05 AM   Modules accepted: Orders

## 2015-07-19 NOTE — Progress Notes (Signed)
Quick Note:  Please make copy of labs for patient visit. ______ 

## 2015-07-20 ENCOUNTER — Ambulatory Visit (INDEPENDENT_AMBULATORY_CARE_PROVIDER_SITE_OTHER): Payer: Medicare Other | Admitting: Obstetrics and Gynecology

## 2015-07-20 ENCOUNTER — Encounter: Payer: Self-pay | Admitting: Obstetrics and Gynecology

## 2015-07-20 VITALS — BP 104/60 | HR 72 | Resp 16 | Ht 64.5 in | Wt 179.0 lb

## 2015-07-20 DIAGNOSIS — Z01419 Encounter for gynecological examination (general) (routine) without abnormal findings: Secondary | ICD-10-CM | POA: Diagnosis not present

## 2015-07-20 DIAGNOSIS — E669 Obesity, unspecified: Secondary | ICD-10-CM | POA: Diagnosis not present

## 2015-07-20 NOTE — Progress Notes (Signed)
81 y.o. G27P3 Widowed Caucasian female here for annual exam.    No vaginal bleeding.  Up one time a night to void.  No urinary incontinence.   Some eternal vaginal itching.  Has to be careful about exposures.  This is not constant.  No problem today.   Back injury from doing heavy lifting.  PT helped.   Daughter died one year ago from metastatic breast cancer.   PCP:  Deland Pretty  Cardiology:  Darlin Coco.  Patient's last menstrual period was 11/19/1983.          Sexually active: No.  The current method of family planning is post menopausal status.    Exercising: No.  The patient does not participate in regular exercise at present. Smoker:  no  Health Maintenance: Pap: 03/09/14 Neg History of abnormal Pap:  Yes, 1996 Leep CIN I MMG:  04/18/15 BIRADS1:Neg Colonoscopy: 04/2012 Normal - Dr. Cristina Gong BMD:   08/2013  Result: Osteopenia.  Dr. Shelia Media. TDaP:  2014 Screening Labs:  Hb today: PCP, Urine today: PCP   reports that she quit smoking about 43 years ago. She has never used smokeless tobacco. She reports that she drinks alcohol. She reports that she does not use illicit drugs.  Past Medical History  Diagnosis Date  . Hypertension   . SVT (supraventricular tachycardia)   . Aortic stenosis, mild   . Pure hypercholesterolemia   . Osteopenia   . Hyperlipidemia   . Diverticulosis   . Adenomatous colon polyp   . Depression     Tx'd 19 yrs ago when husband passed away  . Heart murmur   . Hypothyroidism     Dr. Dwyane Dee follows pt.--had small nodules but had decreased in size and stopped Synthroid 06/2013  . Osteoporosis     Past Surgical History  Procedure Laterality Date  . Dilation and curettage of uterus    . Tonsillectomy    . Colonoscopy  03/2001, 01/2007    2008 - no polyps  . Cervical biopsy  w/ loop electrode excision  1996    CIN I    Current Outpatient Prescriptions  Medication Sig Dispense Refill  . aspirin 81 MG tablet Take 81 mg by mouth every  other day.      [name] BIOTIN PO Take 1 tablet by mouth daily.     . bisoprolol (ZEBETA) 5 MG tablet TAKE 1 TABLET ONCE DAILY. 30 tablet 6  . calcium-vitamin D (CALCIUM 500+D) 500-200 MG-UNIT per tablet Take 1 tablet by mouth daily with breakfast.    . Cholecalciferol (VITAMIN D3) 10000 UNITS capsule Take 10,000 Units by mouth daily. Takes 1000 units daily (not 10,000 units)    . Coenzyme Q10 (COQ10) 100 MG capsule Take 100 mg by mouth daily.      . CRESTOR 5 MG tablet Take 5 mg by mouth daily.    . Cyanocobalamin (B-12 PO) Take 1 tablet by mouth daily.     [name] ibuprofen (ADVIL) 200 MG tablet Take 400 mg by mouth every 6 (six) hours as needed. For pain    . loratadine (CLARITIN) 10 MG tablet Take 10 mg by mouth daily as needed. For allergies    . multivitamin (THERAGRAN) per tablet Take 1 tablet by mouth daily.      . Omega-3 Fatty Acids (SALMON OIL-1000 PO) Take 1 capsule by mouth daily.     . potassium chloride SA (K-DUR,KLOR-CON) 20 MEQ tablet Take 20 mEq by mouth 2 (two) times daily.    [name]  triamterene-hydrochlorothiazide (MAXZIDE-25) 37.5-25 MG per tablet Take 0.5 tablets by mouth daily.     . valsartan (DIOVAN) 80 MG tablet Take 40 mg by mouth daily.     . verapamil (CALAN-SR) 240 MG CR tablet TAKE 1 TABLET ONCE DAILY. 30 tablet 11  . zolpidem (AMBIEN) 10 MG tablet Take 5 mg by mouth at bedtime as needed. For insomnia    . nitroGLYCERIN (NITROSTAT) 0.4 MG SL tablet Place 1 tablet (0.4 mg total) under the tongue every 5 (five) minutes as needed. For chest pain. Do not exceed 3 tablets in 15 minutes (Patient not taking: Reported on 07/20/2015) 25 tablet prn   No current facility-administered medications for this visit.    Family History  Problem Relation Age of Onset  . Stroke Sister   . Breast cancer Daughter 15    metastatic breast ca    ROS:  Pertinent items are noted in HPI.  Otherwise, a comprehensive ROS was negative.  Exam:   BP 104/60 mmHg  Pulse 72  Resp 16  Ht 5' 4.5" (1.638 m)   Wt 179 lb (81.194 kg)  BMI 30.26 kg/m2  LMP 11/19/1983    General appearance: alert, cooperative and appears stated age Head: Normocephalic, without obvious abnormality, atraumatic Neck: no adenopathy, supple, symmetrical, trachea midline and thyroid normal to inspection and palpation Lungs: clear to auscultation bilaterally Breasts: normal appearance, no masses or tenderness, Inspection negative, No nipple retraction or dimpling, No nipple discharge or bleeding, No axillary or supraclavicular adenopathy Heart: regular rate and rhythm Abdomen: soft, non-tender; bowel sounds normal; no masses,  no organomegaly.  Distended lower abdomen? Extremities: extremities normal, atraumatic, no cyanosis or edema Skin: Skin color, texture, turgor normal. No rashes or lesions Lymph nodes: Cervical, supraclavicular, and axillary nodes normal. No abnormal inguinal nodes palpated Neurologic: Grossly normal  Pelvic: External genitalia:  no lesions              Urethra:  normal appearing urethra with no masses, tenderness or lesions              Bartholins and Skenes: normal                 Vagina: normal appearing vagina with normal color and discharge, no lesions              Cervix: no lesions              Pap taken: Yes.   Bimanual Exam:  Uterus:  Exam limited due to body habitus.  Unable to feel uterus.               Adnexa: Exam limited to body habitus.  Unable to feel adnexa.               Rectovaginal: Yes.  .  Confirms.              Anus:  normal sphincter tone, no lesions  Chaperone was present for exam.  Assessment:   Well woman visit with limited ability to do pelvic exam today.  Lower abdominal distention versus normal anatomy.  History of LEEP for CIN I in 1996. Osteopenia.  Family history of daughter with breast cancer.  Bereavement.   Plan: Yearly mammogram recommended after age 34.  Recommended self breast exam.  Pap and HR HPV as above. Discussed Calcium, Vitamin D, regular  exercise program including cardiovascular and weight bearing exercise. BMD through PCP. Labs performed.  No..     Refills given on medications.  No..    Return for pelvic ultrasound.  Support given for loss of daughter. Follow up annually and prn.      After visit summary provided.

## 2015-07-25 ENCOUNTER — Encounter: Payer: Self-pay | Admitting: *Deleted

## 2015-07-25 LAB — IPS PAP SMEAR ONLY

## 2015-07-26 ENCOUNTER — Telehealth: Payer: Self-pay | Admitting: Obstetrics and Gynecology

## 2015-07-26 ENCOUNTER — Encounter: Payer: Self-pay | Admitting: Cardiology

## 2015-07-26 ENCOUNTER — Ambulatory Visit (INDEPENDENT_AMBULATORY_CARE_PROVIDER_SITE_OTHER): Payer: Medicare Other | Admitting: Cardiology

## 2015-07-26 VITALS — BP 108/52 | HR 57 | Ht 65.0 in | Wt 179.0 lb

## 2015-07-26 DIAGNOSIS — M545 Low back pain, unspecified: Secondary | ICD-10-CM | POA: Insufficient documentation

## 2015-07-26 DIAGNOSIS — E7800 Pure hypercholesterolemia, unspecified: Secondary | ICD-10-CM

## 2015-07-26 DIAGNOSIS — Z8679 Personal history of other diseases of the circulatory system: Secondary | ICD-10-CM

## 2015-07-26 DIAGNOSIS — I350 Nonrheumatic aortic (valve) stenosis: Secondary | ICD-10-CM | POA: Diagnosis not present

## 2015-07-26 DIAGNOSIS — I119 Hypertensive heart disease without heart failure: Secondary | ICD-10-CM

## 2015-07-26 MED ORDER — NITROGLYCERIN 0.4 MG SL SUBL: 0.4000 mg | SUBLINGUAL_TABLET | SUBLINGUAL | Status: DC | PRN

## 2015-07-26 NOTE — Telephone Encounter (Signed)
Called patient to review benefits for procedure. Left voicemail to call back and review. Called home and cell numbers.

## 2015-07-26 NOTE — Patient Instructions (Addendum)
Medication Instructions:  Your physician recommends that you continue on your current medications as directed. Please refer to the Current Medication list given to you today.  Labwork: none  Testing/Procedures: none  Follow-Up: Your physician wants you to follow-up in: 4 months with fasting labs (lp/bmet/hfp)  You will receive a reminder letter in the mail two months in advance. If you don't receive a letter, please call our office to schedule the follow-up appointment.

## 2015-07-26 NOTE — Telephone Encounter (Signed)
Spoke with patient. Patient understands and agreeable. Ok to close

## 2015-07-26 NOTE — Progress Notes (Signed)
Cardiology Office Note   Date:  07/26/2015   ID:  Kim Ward, DOB 1931/01/05, MRN [id number]  PCP:  Horatio Pel, MD  Cardiologist: Darlin Coco MD  No chief complaint on file.     History of Present Illness: Kim Ward is a 81 y.o. female who presents for scheduled four-month visit.  This 81 year old widowed Caucasian female is seen for a scheduled followup office visit. She has a past history of hypercholesterolemia, essential hypertension, and supraventricular tachycardia. She does not have any history of ischemic heart disease.  The patient had a walking Lexus scan stress test on 10/22/12 which showed no ischemia and her ejection fraction was 73%.  The patient also had an echocardiogram 10/29/12 showing an ejection fraction of 55-65% with normal systolic function and with grade 2 diastolic dysfunction and with mild aortic stenosis and mild mitral regurgitation. Since last visit the patient has not had any significant episodes of SVT Patient is a widow. Her husband died of colon cancer at age 64. The patient had 3 children. One of her daughters died earlier this year of metastatic breast cancer. Her other children are a daughter living in [place] and a son living in [place]. Since we last saw her [place] Since last visit she has had no recurrent SVT episodes.  She has not been having any chest pain or shortness of breath.  She has been having some gynecologic issues and is scheduled for an ultrasound of the pelvis tomorrow.  The patient has mild aortic stenosis.  She is not having any symptoms.  She is experiencing some low back pain and has seen Dr. Maxie Better who obtained x-rays and prescribed physical therapy.  She was given a trial of tramadol which she did not tolerate.  Past Medical History  Diagnosis Date  . Hypertension   . SVT (supraventricular tachycardia)   . Aortic stenosis, mild   . Pure hypercholesterolemia   .  Osteopenia   . Hyperlipidemia   . Diverticulosis   . Adenomatous colon polyp   . Depression     Tx'd 19 yrs ago when husband passed away  . Heart murmur   . Hypothyroidism     Dr. Dwyane Dee follows pt.--had small nodules but had decreased in size and stopped Synthroid 06/2013  . Osteoporosis     Past Surgical History  Procedure Laterality Date  . Dilation and curettage of uterus    . Tonsillectomy    . Colonoscopy  03/2001, 01/2007    2008 - no polyps  . Cervical biopsy  w/ loop electrode excision  1996    CIN I     Current Outpatient Prescriptions  Medication Sig Dispense Refill  . aspirin 81 MG tablet Take 81 mg by mouth every other day.      [name] BIOTIN PO Take 1 tablet by mouth daily.     . bisoprolol (ZEBETA) 5 MG tablet Take 5 mg by mouth daily.    . calcium-vitamin D (CALCIUM 500+D) 500-200 MG-UNIT per tablet Take 1 tablet by mouth daily with breakfast.    . Cholecalciferol (VITAMIN D3) 10000 UNITS capsule Take 10,000 Units by mouth daily. Takes 1000 units daily (not 10,000 units)    . Coenzyme Q10 (COQ10) 100 MG capsule Take 100 mg by mouth daily.      . CRESTOR 5 MG tablet Take 5 mg by mouth daily.    . Cyanocobalamin (B-12 PO) Take 1 tablet by mouth  daily.     . ibuprofen (ADVIL) 200 MG tablet Take 400 mg by mouth every 6 (six) hours as needed. For pain    . loratadine (CLARITIN) 10 MG tablet Take 10 mg by mouth daily as needed. For allergies    . multivitamin (THERAGRAN) per tablet Take 1 tablet by mouth daily.      . nitroGLYCERIN (NITROSTAT) 0.4 MG SL tablet Place 1 tablet (0.4 mg total) under the tongue every 5 (five) minutes as needed. For chest pain. Do not exceed 3 tablets in 15 minutes 25 tablet prn  . Omega-3 Fatty Acids (SALMON OIL-1000 PO) Take 1 capsule by mouth daily.     . potassium chloride SA (K-DUR,KLOR-CON) 20 MEQ tablet Take 20 mEq by mouth 2 (two) times daily.    [name] triamterene-hydrochlorothiazide (MAXZIDE-25) 37.5-25 MG per tablet Take 0.5 tablets by mouth  daily.     . valsartan (DIOVAN) 80 MG tablet Take 40 mg by mouth daily.     . verapamil (CALAN-SR) 240 MG CR tablet Take 240 mg by mouth daily.    [name] zolpidem (AMBIEN) 10 MG tablet Take 5 mg by mouth at bedtime as needed. For insomnia     No current facility-administered medications for this visit.    Allergies:   Augmentin; Azithromycin; Bextra; Cefuroxime; Cefuroxime axetil; Humibid la; Lipitor; Ramipril; Risedronate sodium; Tramadol; Zocor; and Niaspan    Social History:  The patient  reports that she quit smoking about 43 years ago. She has never used smokeless tobacco. She reports that she drinks alcohol. She reports that she does not use illicit drugs.   Family History:  The patient's family history includes Breast cancer (age of onset: 69) in her daughter; Stroke in her sister.    ROS:  Please see the history of present illness.   Otherwise, review of systems are positive for none.   All other systems are reviewed and negative.    PHYSICAL EXAM: VS:  BP 108/52 mmHg  Pulse 57  Ht '5\' 5"'$  (1.651 m)  Wt 179 lb (81.194 kg)  BMI 29.79 kg/m2  LMP 11/19/1983 , BMI Body mass index is 29.79 kg/(m^2). GEN: Well nourished, well developed, in no acute distress HEENT: normal Neck: no JVD, carotid bruits, or masses Cardiac: RRR; soft basilar systolic murmur.  No gallop.  Respiratory:  clear to auscultation bilaterally, normal work of breathing GI: soft, nontender, nondistended, + BS MS: no deformity or atrophy Skin: warm and dry, no rash Neuro:  Strength and sensation are intact Psych: euthymic mood, full affect   EKG:  EKG is not ordered today.    Recent Labs: 08/12/2014: TSH 0.60 07/19/2015: ALT 18; BUN 24*; Creatinine, Ser 0.96; Potassium 4.0; Sodium 139    Lipid Panel    Component Value Date/Time   CHOL 195 07/19/2015 0805   TRIG 200.0* 07/19/2015 0805   HDL 49.70 07/19/2015 0805   CHOLHDL 4 07/19/2015 0805   VLDL 40.0 07/19/2015 0805   LDLCALC 105* 07/19/2015 0805    LDLDIRECT 140.2 06/24/2014 0843      Wt Readings from Last 3 Encounters:  07/26/15 179 lb (81.194 kg)  07/20/15 179 lb (81.194 kg)  03/22/15 180 lb 12.8 oz (82.01 kg)         ASSESSMENT AND PLAN:  1. essential hypertension 2. history of paroxysmal SVT--she has had no recent episodes since last visit 3. mild aortic stenosis 4. Hypercholesterolemia, improved on current therapy 5. Osteoarthritis of both thumbs. She is followed by Dr. Amedeo Plenty. 6.  Past history of acid reflux when taking nonsteroidals such as Advil 7.  Osteoarthritis and low back pain followed by Dr. Maxie Better  Plan: Continue same medication. Return in 4 months for office visit and fasting lipid panel hepatic function panel and basal metabolic panel   Current medicines are reviewed at length with the patient today.  The patient does not have concerns regarding medicines.  The following changes have been made:  no change  Labs/ tests ordered today include:  No orders of the defined types were placed in this encounter.      Berna Spare MD 07/26/2015 6:19 PM    [address], [place], [place]  [address] Phone: [phone]; Fax: [phone]

## 2015-07-27 ENCOUNTER — Ambulatory Visit (INDEPENDENT_AMBULATORY_CARE_PROVIDER_SITE_OTHER): Payer: Medicare Other

## 2015-07-27 ENCOUNTER — Encounter: Payer: Self-pay | Admitting: Obstetrics and Gynecology

## 2015-07-27 ENCOUNTER — Ambulatory Visit (INDEPENDENT_AMBULATORY_CARE_PROVIDER_SITE_OTHER): Payer: Medicare Other | Admitting: Obstetrics and Gynecology

## 2015-07-27 VITALS — BP 110/72 | HR 60 | Ht 64.5 in | Wt 179.0 lb

## 2015-07-27 DIAGNOSIS — E669 Obesity, unspecified: Secondary | ICD-10-CM

## 2015-07-27 DIAGNOSIS — R140 Abdominal distension (gaseous): Secondary | ICD-10-CM | POA: Diagnosis not present

## 2015-07-27 NOTE — Progress Notes (Signed)
Subjective  81 y.o. G95P3  Caucasian female here for pelvic ultrasound for  evaluation of ovaries and uterus.   Patient had routine exam and was difficult to feel uterus and ovaries on bimanual exam due to abdominal anatomy and involuntary pushing. Exam was a little tender for her.   Family member is here today with her for the visit.   Objective  Pelvic ultrasound images and report reviewed with patient.  Uterus - no masses. EMS - 2.37 mm.  Ovaries - normal. Free fluid - no     Assessment  Abdominal distention at time of pelvic exam.  Normal pelvic anatomy.   Plan  Reassurance regarding anatomy.  Follow up for annual examination in one year and sooner prn.   ____15___ minutes face to face time of which over 50% was spent in counseling regarding pelvic anatomy and pelvic exams.   After visit summary to patient.

## 2015-07-27 NOTE — Patient Instructions (Signed)
Your pelvic ultrasound shows normal female anatomy!

## 2015-11-21 ENCOUNTER — Other Ambulatory Visit: Payer: Self-pay | Admitting: Cardiology

## 2015-11-23 ENCOUNTER — Other Ambulatory Visit: Payer: Self-pay | Admitting: Cardiology

## 2015-12-11 ENCOUNTER — Other Ambulatory Visit (INDEPENDENT_AMBULATORY_CARE_PROVIDER_SITE_OTHER): Payer: Medicare Other | Admitting: *Deleted

## 2015-12-11 ENCOUNTER — Other Ambulatory Visit: Payer: Medicare Other

## 2015-12-11 DIAGNOSIS — E7800 Pure hypercholesterolemia, unspecified: Secondary | ICD-10-CM

## 2015-12-11 LAB — LIPID PANEL
CHOL/HDL RATIO: 3.7 ratio (ref <=5.0)
Cholesterol: 183 mg/dL (ref 125–200)
HDL: 50 mg/dL (ref >=46)
LDL CALC: 102 mg/dL (ref <130)
TRIGLYCERIDES: 156 mg/dL — AB (ref <150)
VLDL: 31 mg/dL — AB (ref <30)

## 2015-12-11 LAB — BASIC METABOLIC PANEL
BUN: 18 mg/dL (ref 7–25)
CHLORIDE: 104 mmol/L (ref 98–110)
CO2: 24 mmol/L (ref 20–31)
Calcium: 9 mg/dL (ref 8.6–10.4)
Creat: 0.96 mg/dL — ABNORMAL HIGH (ref 0.60–0.88)
Glucose, Bld: 99 mg/dL (ref 65–99)
POTASSIUM: 4.1 mmol/L (ref 3.5–5.3)
SODIUM: 140 mmol/L (ref 135–146)

## 2015-12-11 LAB — HEPATIC FUNCTION PANEL
ALBUMIN: 4 g/dL (ref 3.6–5.1)
ALT: 16 U/L (ref 6–29)
AST: 17 U/L (ref 10–35)
Alkaline Phosphatase: 58 U/L (ref 33–130)
BILIRUBIN INDIRECT: 0.4 mg/dL (ref 0.2–1.2)
Bilirubin, Direct: 0.1 mg/dL (ref <=0.2)
TOTAL PROTEIN: 6.5 g/dL (ref 6.1–8.1)
Total Bilirubin: 0.5 mg/dL (ref 0.2–1.2)

## 2015-12-11 NOTE — Progress Notes (Signed)
Quick Note:  Please make copy of labs for patient visit. ______ 

## 2015-12-11 NOTE — Addendum Note (Signed)
Addended by: Eulis Foster on: 12/11/2015 08:53 AM   Modules accepted: Orders

## 2015-12-12 ENCOUNTER — Encounter: Payer: Self-pay | Admitting: Cardiology

## 2015-12-14 ENCOUNTER — Encounter: Payer: Self-pay | Admitting: Cardiology

## 2015-12-14 ENCOUNTER — Ambulatory Visit (INDEPENDENT_AMBULATORY_CARE_PROVIDER_SITE_OTHER): Payer: 59 | Admitting: Cardiology

## 2015-12-14 VITALS — BP 126/60 | HR 60 | Ht 64.0 in | Wt 178.0 lb

## 2015-12-14 DIAGNOSIS — I471 Supraventricular tachycardia: Secondary | ICD-10-CM | POA: Diagnosis not present

## 2015-12-14 DIAGNOSIS — I119 Hypertensive heart disease without heart failure: Secondary | ICD-10-CM | POA: Diagnosis not present

## 2015-12-14 DIAGNOSIS — I350 Nonrheumatic aortic (valve) stenosis: Secondary | ICD-10-CM

## 2015-12-14 DIAGNOSIS — E7800 Pure hypercholesterolemia, unspecified: Secondary | ICD-10-CM

## 2015-12-14 NOTE — Progress Notes (Signed)
Cardiology Office Note   Date:  12/14/2015   ID:  Bridney, Guadarrama 1931-11-06, MRN [id number]  PCP:  Horatio Pel, MD  Cardiologist: Darlin Coco MD  Chief Complaint  Patient presents with  . Follow-up    Patient denies any chest pain, shortness of breath, le edema, or claudication      History of Present Illness: Kim Ward is a 82 y.o. female who presents for a four-month follow-up visit This pleasant woman has a past history of paroxysmal supraventricular tachycardia, hypercholesterolemia, and benign hypertensive heart disease. The patient also had an echocardiogram 10/29/12 showing an ejection fraction of 55-65% with normal systolic function and with grade 2 diastolic dysfunction and with mild aortic stenosis and mild mitral regurgitation. Since last visit the patient has not had any significant episodes of SVT Patient is a widow. Her husband died of colon cancer at age 63. The patient had 3 children. One of her daughters died earlier this year of metastatic breast cancer. Her other children are a daughter living in [place] and a son living in [place].  Since last visit she has had no recurrent SVT episodes. She has not been having any chest pain or shortness of breath. She has been having some gynecologic issues and is scheduled for an ultrasound of the pelvis tomorrow. The patient has mild aortic stenosis. She is not having any symptoms. She is experiencing some low back pain and has seen Dr. Maxie Better who obtained x-rays and prescribed physical therapy. She was given a trial of tramadol which she did not tolerate. She has not been expressing any chest pain to suggest angina.  Past Medical History  Diagnosis Date  . Hypertension   . SVT (supraventricular tachycardia) ([place])   . Aortic stenosis, mild   . Pure hypercholesterolemia   . Osteopenia   . Hyperlipidemia   . Diverticulosis   . Adenomatous colon polyp   . Depression     Tx'd 19 yrs ago  when husband passed away  . Heart murmur   . Hypothyroidism     Dr. Dwyane Dee follows pt.--had small nodules but had decreased in size and stopped Synthroid 06/2013  . Osteoporosis     Past Surgical History  Procedure Laterality Date  . Dilation and curettage of uterus    . Tonsillectomy    . Colonoscopy  03/2001, 01/2007    2008 - no polyps  . Cervical biopsy  w/ loop electrode excision  1996    [facility]     Current Outpatient Prescriptions  Medication Sig Dispense Refill  . aspirin 81 MG tablet Take 81 mg by mouth every other day.      [name] BIOTIN PO Take 1 tablet by mouth daily.     . bisoprolol (ZEBETA) 5 MG tablet Take 5 mg by mouth daily.    . calcium-vitamin D (CALCIUM 500+D) 500-200 MG-UNIT per tablet Take 1 tablet by mouth daily with breakfast.    . Cholecalciferol (VITAMIN D3) 10000 UNITS capsule Take 10,000 Units by mouth daily. Takes 1000 units daily (not 10,000 units)    . Coenzyme Q10 (COQ10) 100 MG capsule Take 100 mg by mouth daily.      . Cyanocobalamin (B-12 PO) Take 1 tablet by mouth daily.     [name] ibuprofen (ADVIL) 200 MG tablet Take 400 mg by mouth every 6 (six) hours as needed. For pain    . loratadine (CLARITIN) 10 MG tablet Take 10 mg by mouth daily as needed.  For allergies    . multivitamin (THERAGRAN) per tablet Take 1 tablet by mouth daily.      . nitroGLYCERIN (NITROSTAT) 0.4 MG SL tablet Place 1 tablet (0.4 mg total) under the tongue every 5 (five) minutes as needed. For chest pain. Do not exceed 3 tablets in 15 minutes 25 tablet prn  . Omega-3 Fatty Acids (SALMON OIL-1000 PO) Take 1 capsule by mouth daily.     . potassium chloride SA (K-DUR,KLOR-CON) 20 MEQ tablet Take 20 mEq by mouth 2 (two) times daily.    . rosuvastatin (CRESTOR) 10 MG tablet Take 10 mg by mouth daily.    [name] triamterene-hydrochlorothiazide (MAXZIDE-25) 37.5-25 MG per tablet Take 0.5 tablets by mouth daily.     . valsartan (DIOVAN) 80 MG tablet Take 40 mg by mouth daily.     . verapamil  (CALAN-SR) 240 MG CR tablet Take 240 mg by mouth daily.    [name] zolpidem (AMBIEN) 10 MG tablet Take 5 mg by mouth at bedtime as needed. For insomnia     No current facility-administered medications for this visit.    Allergies:   Augmentin; Azithromycin; Bextra; Cefuroxime; Cefuroxime axetil; Humibid la; Lipitor; Ramipril; Risedronate sodium; Tramadol; Zocor; and Niaspan    Social History:  The patient  reports that she quit smoking about 43 years ago. She has never used smokeless tobacco. She reports that she drinks alcohol. She reports that she does not use illicit drugs.   Family History:  The patient's family history includes Breast cancer (age of onset: 69) in her daughter; Stroke in her sister.    ROS:  Please see the history of present illness.   Otherwise, review of systems are positive for none.   All other systems are reviewed and negative.    PHYSICAL EXAM: VS:  BP 126/60 mmHg  Pulse 60  Ht '5\' 4"'$  (1.626 m)  Wt 178 lb (80.74 kg)  BMI 30.54 kg/m2  LMP 11/19/1983 , BMI Body mass index is 30.54 kg/(m^2). GEN: Well nourished, well developed, in no acute distress HEENT: normal Neck: no JVD, carotid bruits, or masses Cardiac: RRR; there is as is soft grade 2/6 systolic ejection murmur at the base.  No diastolic murmur.  No gallop or rub. Respiratory:  clear to auscultation bilaterally, normal work of breathing GI: soft, nontender, nondistended, + BS MS: no deformity or atrophy Skin: warm and dry, no rash Neuro:  Strength and sensation are intact Psych: euthymic mood, full affect   EKG:  EKG is not ordered today.    Recent Labs: 12/11/2015: ALT 16; BUN 18; Creat 0.96*; Potassium 4.1; Sodium 140    Lipid Panel    Component Value Date/Time   CHOL 183 12/11/2015 0853   TRIG 156* 12/11/2015 0853   HDL 50 12/11/2015 0853   CHOLHDL 3.7 12/11/2015 0853   VLDL 31* 12/11/2015 0853   LDLCALC 102 12/11/2015 0853   LDLDIRECT 140.2 06/24/2014 0843      Wt Readings from Last  3 Encounters:  12/14/15 178 lb (80.74 kg)  07/27/15 179 lb (81.194 kg)  07/26/15 179 lb (81.194 kg)        ASSESSMENT AND PLAN:  1. essential hypertension 2. history of paroxysmal SVT--she has had no recent episodes since last visit 3. mild aortic stenosis 4. Hypercholesterolemia, improved on current therapy 5. Osteoarthritis of both thumbs. She is followed by Dr. Amedeo Plenty. 6. Past history of acid reflux when taking nonsteroidals such as Advil 7. Osteoarthritis and low back pain followed by  Dr. Maxie Better   Current medicines are reviewed at length with the patient today.  The patient does not have concerns regarding medicines.  The following changes have been made:  no change  Labs/ tests ordered today include:  No orders of the defined types were placed in this encounter.     Disposition:   Continue current medication.  Recheck in 4 months for office visit and EKG with Dr. Stanford Breed  Signed, Darlin Coco MD 12/14/2015 6:11 PM    [address], [place], [place]  [address] Phone: [phone]; Fax: [phone]

## 2015-12-14 NOTE — Patient Instructions (Signed)
Medication Instructions:   Your physician recommends that you continue on your current medications as directed. Please refer to the Current Medication list given to you today.   If you need a refill on your cardiac medications before your next appointment, please call your pharmacy.  Labwork: NONE ORDER TODAY    Testing/Procedures:  NONE ORDER TODAY    Follow-Up: in  4 MONTHS WITH DR CRENSHAW    Any Other Special Instructions Will Be Listed Below (If Applicable).

## 2015-12-27 ENCOUNTER — Other Ambulatory Visit: Payer: Self-pay | Admitting: Cardiology

## 2016-03-11 ENCOUNTER — Other Ambulatory Visit: Payer: Self-pay | Admitting: *Deleted

## 2016-03-11 MED ORDER — BISOPROLOL FUMARATE 5 MG PO TABS: 5.0000 mg | ORAL_TABLET | Freq: Every day | ORAL | Status: DC

## 2016-04-10 NOTE — Progress Notes (Signed)
HPI: FU paroxysmal supraventricular tachycardia, hypercholesterolemia, and benign hypertensive heart disease. Previously followed by Dr Mare Ferrari.  Echocardiogram 10/29/12 showed an ejection fraction of 55-65% with normal systolic function and grade 2 diastolic dysfunction; mild aortic stenosis and mild mitral regurgitation.Nuclear study December 2013 showed ejection fraction 73%And normal perfusion. Since last seen, she has some DOE; no chest pain; one episode of SVT terminated with cold water in face; occasional brief dizzy spells.   Current Outpatient Prescriptions  Medication Sig Dispense Refill  . aspirin 81 MG tablet Take 81 mg by mouth every other day.      [name] BIOTIN PO Take 1 tablet by mouth daily.     . bisoprolol (ZEBETA) 5 MG tablet Take 1 tablet (5 mg total) by mouth daily. 90 tablet 2  . calcium-vitamin D (CALCIUM 500+D) 500-200 MG-UNIT per tablet Take 1 tablet by mouth daily with breakfast.    . Cholecalciferol (VITAMIN D3) 10000 UNITS capsule Take 10,000 Units by mouth daily. Takes 1000 units daily (not 10,000 units)    . Coenzyme Q10 (COQ10) 100 MG capsule Take 100 mg by mouth daily.      . Cyanocobalamin (B-12 PO) Take 1 tablet by mouth daily.     [name] ibuprofen (ADVIL) 200 MG tablet Take 400 mg by mouth every 6 (six) hours as needed. For pain    . loratadine (CLARITIN) 10 MG tablet Take 10 mg by mouth daily as needed. For allergies    . multivitamin (THERAGRAN) per tablet Take 1 tablet by mouth daily.      . nitroGLYCERIN (NITROSTAT) 0.4 MG SL tablet Place 1 tablet (0.4 mg total) under the tongue every 5 (five) minutes as needed. For chest pain. Do not exceed 3 tablets in 15 minutes 25 tablet prn  . Omega-3 Fatty Acids (SALMON OIL-1000 PO) Take 1 capsule by mouth daily.     . potassium chloride SA (K-DUR,KLOR-CON) 20 MEQ tablet TAKE 1 TABLET ONCE DAILY. 30 tablet 3  . rosuvastatin (CRESTOR) 10 MG tablet Take 10 mg by mouth daily.    [name] triamterene-hydrochlorothiazide  (MAXZIDE-25) 37.5-25 MG per tablet Take 0.5 tablets by mouth daily.     . valsartan (DIOVAN) 80 MG tablet Take 40 mg by mouth daily.     . verapamil (CALAN-SR) 120 MG CR tablet Take 1 tablet (120 mg total) by mouth daily. 90 tablet 3  . zolpidem (AMBIEN) 10 MG tablet Take 5 mg by mouth at bedtime as needed. For insomnia     No current facility-administered medications for this visit.     Past Medical History  Diagnosis Date  . Hypertension   . SVT (supraventricular tachycardia) ([place])   . Aortic stenosis, mild   . Pure hypercholesterolemia   . Osteopenia   . Hyperlipidemia   . Diverticulosis   . Adenomatous colon polyp   . Depression     Tx'd 19 yrs ago when husband passed away  . Heart murmur   . Hypothyroidism     Dr. Dwyane Dee follows pt.--had small nodules but had decreased in size and stopped Synthroid 06/2013  . Osteoporosis     Past Surgical History  Procedure Laterality Date  . Dilation and curettage of uterus    . Tonsillectomy    . Colonoscopy  03/2001, 01/2007    2008 - no polyps  . Cervical biopsy  w/ loop electrode excision  1996    [facility]    Social History   Social History  . Marital  Status: Widowed    Spouse Name: N/A  . Number of Children: N/A  . Years of Education: N/A   Occupational History  . Not on file.   Social History Main Topics  . Smoking status: Former Smoker    Quit date: 06/17/1972  . Smokeless tobacco: Never Used     Comment: quit June 17, 1972  . Alcohol Use: 0.0 oz/week    0 Standard drinks or equivalent per week     Comment: 2 drinks per month  . Drug Use: No  . Sexual Activity: No   Other Topics Concern  . Not on file   Social History Narrative    Family History  Problem Relation Age of Onset  . Stroke Sister   . Breast cancer Daughter 40    metastatic breast ca    ROS: no fevers or chills, productive cough, hemoptysis, dysphasia, odynophagia, melena, hematochezia, dysuria, hematuria, rash, seizure activity, orthopnea,  PND, pedal edema, claudication. Remaining systems are negative.  Physical Exam: Well-developed well-nourished in no acute distress.  Skin is warm and dry.  HEENT is normal.  Neck is supple.  Chest is clear to auscultation with normal expansion.  Cardiovascular exam is regular and bradycardic; 2/6 systolic murmur LSB Abdominal exam nontender or distended. No masses palpated. Extremities show no edema. neuro grossly intact  ECG Sinus rhythm with first-degree AV block. Heart rate 49. No ST changes.

## 2016-04-12 ENCOUNTER — Encounter: Payer: Self-pay | Admitting: Cardiology

## 2016-04-12 ENCOUNTER — Ambulatory Visit (INDEPENDENT_AMBULATORY_CARE_PROVIDER_SITE_OTHER): Payer: Medicare Other | Admitting: Cardiology

## 2016-04-12 VITALS — BP 124/54 | HR 49 | Ht 65.0 in | Wt 176.0 lb

## 2016-04-12 DIAGNOSIS — I119 Hypertensive heart disease without heart failure: Secondary | ICD-10-CM | POA: Diagnosis not present

## 2016-04-12 DIAGNOSIS — E7800 Pure hypercholesterolemia, unspecified: Secondary | ICD-10-CM | POA: Diagnosis not present

## 2016-04-12 DIAGNOSIS — I471 Supraventricular tachycardia: Secondary | ICD-10-CM | POA: Diagnosis not present

## 2016-04-12 DIAGNOSIS — I350 Nonrheumatic aortic (valve) stenosis: Secondary | ICD-10-CM | POA: Diagnosis not present

## 2016-04-12 MED ORDER — VERAPAMIL HCL ER 120 MG PO TBCR
120.0000 mg | EXTENDED_RELEASE_TABLET | Freq: Every day | ORAL | Status: DC
Start: 2016-04-12 — End: 2016-07-18

## 2016-04-12 NOTE — Assessment & Plan Note (Addendum)
She has episodes infrequently terminated with valsalva maneuvers. Continue verapamil (decrease to 120 mg daily given bradycardia and first degree AV block) and beta blocker; if more frequent episodes in the future, would refer to EP for consideration of ablation.

## 2016-04-12 NOTE — Assessment & Plan Note (Signed)
Plan repeat echocardiogram. 

## 2016-04-12 NOTE — Patient Instructions (Signed)
Medication Instructions:   DECREASE VERAPAMIL TO 120 MG ONCE DAILY= 1/2 OF THE 240 MG TABLET ONCE DAILY  Testing/Procedures:  Your physician has requested that you have an echocardiogram. Echocardiography is a painless test that uses sound waves to create images of your heart. It provides your doctor with information about the size and shape of your heart and how well your heart's chambers and valves are working. This procedure takes approximately one hour. There are no restrictions for this procedure.  Follow-Up:  Your physician wants you to follow-up in: [place] will receive a reminder letter in the mail two months in advance. If you don't receive a letter, please call our office to schedule the follow-up appointment.   If you need a refill on your cardiac medications before your next appointment, please call your pharmacy.

## 2016-04-12 NOTE — Assessment & Plan Note (Addendum)
Blood pressure controlled. Continue present medications. Note she is bradycardic with first degree AV block; decrease verapamil to 120 mg daily; follow BP and adjust regimen as needed.

## 2016-04-12 NOTE — Assessment & Plan Note (Signed)
Continue statin. 

## 2016-05-02 ENCOUNTER — Ambulatory Visit (HOSPITAL_COMMUNITY): Payer: Medicare Other | Attending: Cardiovascular Disease

## 2016-05-02 ENCOUNTER — Other Ambulatory Visit: Payer: Self-pay

## 2016-05-02 DIAGNOSIS — I350 Nonrheumatic aortic (valve) stenosis: Secondary | ICD-10-CM

## 2016-05-02 DIAGNOSIS — I119 Hypertensive heart disease without heart failure: Secondary | ICD-10-CM | POA: Diagnosis not present

## 2016-05-02 DIAGNOSIS — Z87891 Personal history of nicotine dependence: Secondary | ICD-10-CM | POA: Insufficient documentation

## 2016-05-02 DIAGNOSIS — E785 Hyperlipidemia, unspecified: Secondary | ICD-10-CM | POA: Diagnosis not present

## 2016-05-02 LAB — ECHOCARDIOGRAM COMPLETE
AO mean calculated velocity dopler: 199 cm/s
AOASC: 31 cm
AOPV: 0.31 m/s
AOVTI: 73.7 cm
AV Area VTI: 0.98 cm2
AV Area mean vel: 1.03 cm2
AV VEL mean LVOT/AV: 0.33
AV pk vel: 289 cm/s
AVAREAMEANVIN: 0.55 cm2/m2
AVAREAVTIIND: 0.6 cm2/m2
AVG: 18 mmHg
AVPG: 33 mmHg
CHL CUP AV PEAK INDEX: 0.52
CHL CUP AV VALUE AREA INDEX: 0.6
CHL CUP AV VEL: 1.12
CHL CUP DOP CALC LVOT VTI: 26.2 cm
E decel time: 183 msec
E/e' ratio: 11.65
FS: 42 % (ref 28–44)
IVS/LV PW RATIO, ED: 1.06
LA ID, A-P, ES: 41 mm
LA vol A4C: 63 ml
LA vol index: 27.4 mL/m2
LADIAMINDEX: 2.19 cm/m2
LAVOL: 51.3 mL
LEFT ATRIUM END SYS DIAM: 41 mm
LV E/e'average: 11.65
LVEEMED: 11.65
LVELAT: 8.84 cm/s
LVOT area: 3.14 cm2
LVOT diameter: 20 mm
LVOT peak VTI: 0.36 cm
LVOT peak vel: 90 cm/s
LVOTSV: 82 mL
MV Dec: 183
MV Peak grad: 4 mmHg
MV pk A vel: 94.1 m/s
MV pk E vel: 103 m/s
PW: 7.67 mm — AB (ref 0.6–1.1)
TAPSE: 24 mm
TDI e' lateral: 8.84
TDI e' medial: 7.51
Valve area: 1.12 cm2

## 2016-06-03 ENCOUNTER — Telehealth: Payer: Self-pay | Admitting: Cardiology

## 2016-06-03 NOTE — Telephone Encounter (Signed)
Returned call to patient spoke to daughter she stated mother on other phone with PCP.Stated she woke up this morning with dizziness.B/P 132/62,pulse 58.Advised she needs to see PCP.

## 2016-06-03 NOTE — Telephone Encounter (Signed)
Kim Ward is calling because she is extremely dizzy . Which stared on this morning , It has improve some. Please call   Thanks

## 2016-06-24 ENCOUNTER — Other Ambulatory Visit: Payer: Self-pay | Admitting: Obstetrics and Gynecology

## 2016-06-24 ENCOUNTER — Telehealth: Payer: Self-pay | Admitting: Cardiology

## 2016-06-24 DIAGNOSIS — I350 Nonrheumatic aortic (valve) stenosis: Secondary | ICD-10-CM

## 2016-06-24 DIAGNOSIS — E7800 Pure hypercholesterolemia, unspecified: Secondary | ICD-10-CM

## 2016-06-24 DIAGNOSIS — E039 Hypothyroidism, unspecified: Secondary | ICD-10-CM

## 2016-06-24 DIAGNOSIS — Z1231 Encounter for screening mammogram for malignant neoplasm of breast: Secondary | ICD-10-CM

## 2016-06-24 DIAGNOSIS — I119 Hypertensive heart disease without heart failure: Secondary | ICD-10-CM

## 2016-06-24 NOTE — Telephone Encounter (Signed)
Returned call to patient she stated she had a severe dizzy spell couple of weeks ago.Stated she has not felt good since.No dizziness.Stated she would like to have lab work and appointment to be seen.Appointment scheduled with [name] PA 06/26/16 at 2:00 pm.Advised she can have fasting lab tomorrow at [facility] lab.Advised no lab appointment needed.

## 2016-06-24 NOTE — Telephone Encounter (Signed)
Mrs. Trager is calling because she thinks she is taking to much medication and feel that it is causing her to be dizzy. Please call   Thanks

## 2016-06-25 LAB — LIPID PANEL
Cholesterol: 153 mg/dL (ref 125–200)
HDL: 55 mg/dL (ref >=46)
LDL Cholesterol: 67 mg/dL (ref <130)
Total CHOL/HDL Ratio: 2.8 Ratio (ref <=5.0)
Triglycerides: 156 mg/dL — ABNORMAL HIGH (ref <150)
VLDL: 31 mg/dL — ABNORMAL HIGH (ref <30)

## 2016-06-25 LAB — BASIC METABOLIC PANEL
BUN: 23 mg/dL (ref 7–25)
CO2: 22 mmol/L (ref 20–31)
Calcium: 9.5 mg/dL (ref 8.6–10.4)
Chloride: 105 mmol/L (ref 98–110)
Creat: 0.95 mg/dL — ABNORMAL HIGH (ref 0.60–0.88)
Glucose, Bld: 95 mg/dL (ref 65–99)
Potassium: 4.4 mmol/L (ref 3.5–5.3)
Sodium: 139 mmol/L (ref 135–146)

## 2016-06-25 LAB — CBC WITH DIFFERENTIAL/PLATELET
Basophils Absolute: 61 cells/uL (ref 0–200)
Basophils Relative: 1 %
Eosinophils Absolute: 183 cells/uL (ref 15–500)
Eosinophils Relative: 3 %
HCT: 41.7 % (ref 35.0–45.0)
Hemoglobin: 13.8 g/dL (ref 11.7–15.5)
Lymphocytes Relative: 44 %
Lymphs Abs: 2684 cells/uL (ref 850–3900)
MCH: 29.2 pg (ref 27.0–33.0)
MCHC: 33.1 g/dL (ref 32.0–36.0)
MCV: 88.2 fL (ref 80.0–100.0)
MPV: 9.5 fL (ref 7.5–12.5)
Monocytes Absolute: 488 cells/uL (ref 200–950)
Monocytes Relative: 8 %
Neutro Abs: 2684 cells/uL (ref 1500–7800)
Neutrophils Relative %: 44 %
Platelets: 266 10*3/uL (ref 140–400)
RBC: 4.73 MIL/uL (ref 3.80–5.10)
RDW: 14.1 % (ref 11.0–15.0)
WBC: 6.1 10*3/uL (ref 3.8–10.8)

## 2016-06-25 LAB — TSH: TSH: 2.45 mIU/L

## 2016-06-26 ENCOUNTER — Ambulatory Visit (INDEPENDENT_AMBULATORY_CARE_PROVIDER_SITE_OTHER): Payer: Medicare Other | Admitting: Cardiology

## 2016-06-26 ENCOUNTER — Encounter: Payer: Self-pay | Admitting: Cardiology

## 2016-06-26 ENCOUNTER — Encounter (INDEPENDENT_AMBULATORY_CARE_PROVIDER_SITE_OTHER): Payer: Self-pay

## 2016-06-26 DIAGNOSIS — I471 Supraventricular tachycardia: Secondary | ICD-10-CM | POA: Diagnosis not present

## 2016-06-26 DIAGNOSIS — I350 Nonrheumatic aortic (valve) stenosis: Secondary | ICD-10-CM | POA: Diagnosis not present

## 2016-06-26 DIAGNOSIS — E7800 Pure hypercholesterolemia, unspecified: Secondary | ICD-10-CM

## 2016-06-26 DIAGNOSIS — I119 Hypertensive heart disease without heart failure: Secondary | ICD-10-CM | POA: Diagnosis not present

## 2016-06-26 DIAGNOSIS — R42 Dizziness and giddiness: Secondary | ICD-10-CM

## 2016-06-26 LAB — HEPATIC FUNCTION PANEL
ALT: 19 U/L (ref 6–29)
AST: 20 U/L (ref 10–35)
Albumin: 4.2 g/dL (ref 3.6–5.1)
Alkaline Phosphatase: 59 U/L (ref 33–130)
Bilirubin, Direct: 0.1 mg/dL (ref <=0.2)
Indirect Bilirubin: 0.4 mg/dL (ref 0.2–1.2)
Total Bilirubin: 0.5 mg/dL (ref 0.2–1.2)
Total Protein: 6.5 g/dL (ref 6.1–8.1)

## 2016-06-26 MED ORDER — METOPROLOL TARTRATE 25 MG PO TABS: 12.5000 mg | ORAL_TABLET | Freq: Two times a day (BID) | ORAL | 1 refills | Status: DC

## 2016-06-26 NOTE — Assessment & Plan Note (Signed)
This sounds like an episode of orthostatic dizziness or possibly transient bradycardia 3 weeks ago.

## 2016-06-26 NOTE — Assessment & Plan Note (Signed)
LDL 67 on recent lipid panel

## 2016-06-26 NOTE — Assessment & Plan Note (Signed)
Echo June 2017

## 2016-06-26 NOTE — Assessment & Plan Note (Signed)
History of PSVT

## 2016-06-26 NOTE — Assessment & Plan Note (Signed)
Grade 2 DD on echo 2013, EF 55-60% with mild AS June 2017

## 2016-06-26 NOTE — Progress Notes (Signed)
06/26/2016 Kim Ward   Aug 24, 1931  [id number]  Primary Physician Horatio Pel, MD Primary Cardiologist: Dr Stanford Breed  HPI:  82 y/o female, lives alone, previously followed by Dr Mare Ferrari, now Dr Stanford Breed. She has a history of past PSVT, hypercholesterolemia, and benign hypertensive heart disease.  Echocardiogram 10/29/12 showed an ejection fraction of 55-65% with normal systolic function and grade 2 diastolic dysfunction; mild aortic stenosis and mild mitral regurgitation.Nuclear study December 2013 showed ejection fraction 73%And normal perfusion. She is in the office today with complaints of dizziness. She says 3 weeks ago she was getting out of bed and became profoundly dizzy with mild diaphoresis. This lasted several minutes. She denies any tachycardia or nausea. She has had mild orthostatic dizziness since.    Current Outpatient Prescriptions  Medication Sig Dispense Refill  . aspirin 81 MG tablet Take 81 mg by mouth every other day.      [name] BIOTIN PO Take 1 tablet by mouth daily.     . bisoprolol (ZEBETA) 5 MG tablet Take 1 tablet (5 mg total) by mouth daily. 90 tablet 2  . calcium-vitamin D (CALCIUM 500+D) 500-200 MG-UNIT per tablet Take 1 tablet by mouth daily with breakfast.    . Cholecalciferol (VITAMIN D3) 10000 UNITS capsule Take 10,000 Units by mouth daily. Takes 1000 units daily (not 10,000 units)    . Coenzyme Q10 (COQ10) 100 MG capsule Take 100 mg by mouth daily.      . Cyanocobalamin (B-12 PO) Take 1 tablet by mouth daily.     [name] ibuprofen (ADVIL) 200 MG tablet Take 400 mg by mouth every 6 (six) hours as needed. For pain    . loratadine (CLARITIN) 10 MG tablet Take 10 mg by mouth daily as needed. For allergies    . multivitamin (THERAGRAN) per tablet Take 1 tablet by mouth daily.      . nitroGLYCERIN (NITROSTAT) 0.4 MG SL tablet Place 1 tablet (0.4 mg total) under the tongue every 5 (five) minutes as needed. For chest pain. Do not exceed 3 tablets in 15 minutes 25  tablet prn  . Omega-3 Fatty Acids (SALMON OIL-1000 PO) Take 1 capsule by mouth daily.     . potassium chloride SA (K-DUR,KLOR-CON) 20 MEQ tablet TAKE 1 TABLET ONCE DAILY. 30 tablet 3  . rosuvastatin (CRESTOR) 10 MG tablet Take 10 mg by mouth daily.    [name] triamterene-hydrochlorothiazide (MAXZIDE-25) 37.5-25 MG per tablet Take 0.5 tablets by mouth daily.     . valsartan (DIOVAN) 80 MG tablet Take 40 mg by mouth daily.     . verapamil (CALAN-SR) 120 MG CR tablet Take 1 tablet (120 mg total) by mouth daily. 90 tablet 3  . zolpidem (AMBIEN) 10 MG tablet Take 5 mg by mouth at bedtime as needed. For insomnia     No current facility-administered medications for this visit.     Allergies  Allergen Reactions  . Augmentin [Amoxicillin-Pot Clavulanate]   . Azithromycin     Or any drug that ends in "mycin"  . Bextra [Valdecoxib]     Unknown  . Cefuroxime Other (See Comments)    dizziness  . Cefuroxime Axetil     Severe dizziness  . Humibid La [Guaifenesin] Other (See Comments)    Mouth sores  . Lipitor [Atorvastatin Calcium]     MYALGIAS  . Ramipril Cough  . Risedronate Sodium      GI ISSUES  . Tramadol Other (See Comments)    Vomiting and diarrhea  .  Zocor [Simvastatin]     MYALGIAS  . Niaspan [Niacin Er] Rash    Social History   Social History  . Marital status: Widowed    Spouse name: N/A  . Number of children: N/A  . Years of education: N/A   Occupational History  . Not on file.   Social History Main Topics  . Smoking status: Former Smoker    Quit date: 06/17/1972  . Smokeless tobacco: Never Used     Comment: quit June 17, 1972  . Alcohol use 0.0 oz/week     Comment: 2 drinks per month  . Drug use: No  . Sexual activity: No   Other Topics Concern  . Not on file   Social History Narrative  . No narrative on file     Review of Systems: General: negative for chills, fever, night sweats or weight changes.  Cardiovascular: negative for chest pain, dyspnea on  exertion, edema, orthopnea, palpitations, paroxysmal nocturnal dyspnea or shortness of breath Dermatological: negative for rash Respiratory: negative for cough or wheezing Urologic: negative for hematuria Abdominal: negative for nausea, vomiting, diarrhea, bright red blood per rectum, melena, or hematemesis Neurologic: negative for visual changes, syncope, or dizziness All other systems reviewed and are otherwise negative except as noted above.    Blood pressure (!) 82/44, pulse (!) 54, height 5' 4.5" (1.638 m), weight 175 lb 2 oz (79.4 kg), last menstrual period 11/19/1983.  B/P by me 112/50 Rt, 108 /50 on Lt General appearance: alert, cooperative and no distress Neck: no JVD and transmitted murmur Lungs: few crackles Rt base Heart: regular rate and rhythm and 2/6 sytolic murmur AOV, preserved S2 Extremities: no edema Skin: Skin color, texture, turgor normal. No rashes or lesions Neurologic: Grossly normal  EKG NSR, SB-51, 1st degree AVB  ASSESSMENT AND PLAN:   SVT (supraventricular tachycardia) ([facility]) History of PSVT  Hypertensive cardiovascular disease Grade 2 DD on echo 2013, EF 55-60% with mild AS June 2017  Aortic stenosis, mild Echo June 2017  Hypercholesteremia LDL 67 on recent lipid panel  Dizziness This sounds like an episode of orthostatic dizziness or possibly transient bradycardia 3 weeks ago.    PLAN  Stop Verapamil. Re check in 2 weeks.   [name] PA-C 06/26/2016 3:11 PM

## 2016-06-26 NOTE — Patient Instructions (Addendum)
Medication Instructions:  STOP Verapamil    Labwork: None ordered  Testing/Procedures: None ordered  Follow-Up: Your physician recommends that you schedule a follow-up appointment in: [place].   Any Other Special Instructions Will Be Listed Below (If Applicable).     If you need a refill on your cardiac medications before your next appointment, please call your pharmacy.

## 2016-07-05 ENCOUNTER — Ambulatory Visit
Admission: RE | Admit: 2016-07-05 | Discharge: 2016-07-05 | Disposition: A | Discharge status: Home / Self Care | Payer: Medicare Other | Source: Ambulatory Visit | Attending: Obstetrics and Gynecology | Admitting: Obstetrics and Gynecology

## 2016-07-05 DIAGNOSIS — Z1231 Encounter for screening mammogram for malignant neoplasm of breast: Secondary | ICD-10-CM

## 2016-07-18 ENCOUNTER — Encounter: Payer: Self-pay | Admitting: Cardiology

## 2016-07-18 ENCOUNTER — Ambulatory Visit (INDEPENDENT_AMBULATORY_CARE_PROVIDER_SITE_OTHER): Payer: Medicare Other | Admitting: Cardiology

## 2016-07-18 DIAGNOSIS — I471 Supraventricular tachycardia: Secondary | ICD-10-CM | POA: Diagnosis not present

## 2016-07-18 DIAGNOSIS — R42 Dizziness and giddiness: Secondary | ICD-10-CM | POA: Diagnosis not present

## 2016-07-18 DIAGNOSIS — I119 Hypertensive heart disease without heart failure: Secondary | ICD-10-CM

## 2016-07-18 NOTE — Assessment & Plan Note (Signed)
History of PSVT, continue Bisoprol

## 2016-07-18 NOTE — Assessment & Plan Note (Signed)
Grade 2 DD on echo 2013, EF 55-60% with mild AS June 2017

## 2016-07-18 NOTE — Assessment & Plan Note (Signed)
Probably secondary to orthostatic B/P and bradycardia. She is improved off Verapamil.

## 2016-07-18 NOTE — Progress Notes (Signed)
07/18/2016 Kim Ward   11/04/1931  [id number]  Primary Physician Horatio Pel, MD Primary Cardiologist: Dr Stanford Breed  HPI:    82 y/o female, lives alone, previously followed by Dr Mare Ferrari, now Dr Stanford Breed. She has a history of past PSVT, hypercholesterolemia, and benign hypertensive heart disease. Echocardiogram 10/29/12 showed an ejection fraction of 55-65% with normal systolic function and grade 2 diastolic dysfunction; mild aortic stenosis and mild mitral regurgitation.Nuclear study December 2013 showed ejection fraction 73%And normal perfusion. She was seen in the office 06/26/16 with complaints of dizziness and orthostatic symptoms. EKG showed a HR of 50 with 1 st degree AVB. I stopped her Verapamil and she is in the clinic today for follow up. She feesl much better, HR is 68, BP controlled.    Current Outpatient Prescriptions  Medication Sig Dispense Refill  . aspirin 81 MG tablet Take 81 mg by mouth every other day.      [name] BIOTIN PO Take 1 tablet by mouth daily.     . bisoprolol (ZEBETA) 5 MG tablet Take 1 tablet (5 mg total) by mouth daily. 90 tablet 2  . calcium-vitamin D (CALCIUM 500+D) 500-200 MG-UNIT per tablet Take 1 tablet by mouth daily with breakfast.    . Cholecalciferol (VITAMIN D3) 10000 UNITS capsule Take 10,000 Units by mouth daily. Takes 1000 units daily (not 10,000 units)    . Coenzyme Q10 (COQ10) 100 MG capsule Take 100 mg by mouth daily.      . Cyanocobalamin (B-12 PO) Take 1 tablet by mouth daily.     [name] ibuprofen (ADVIL) 200 MG tablet Take 400 mg by mouth every 6 (six) hours as needed. For pain    . loratadine (CLARITIN) 10 MG tablet Take 10 mg by mouth daily as needed. For allergies    . multivitamin (THERAGRAN) per tablet Take 1 tablet by mouth daily.      . nitroGLYCERIN (NITROSTAT) 0.4 MG SL tablet Place 1 tablet (0.4 mg total) under the tongue every 5 (five) minutes as needed. For chest pain. Do not exceed 3 tablets in 15 minutes 25 tablet prn  .  Omega-3 Fatty Acids (SALMON OIL-1000 PO) Take 1 capsule by mouth daily.     . potassium chloride SA (K-DUR,KLOR-CON) 20 MEQ tablet TAKE 1 TABLET ONCE DAILY. 30 tablet 3  . rosuvastatin (CRESTOR) 10 MG tablet Take 10 mg by mouth daily.    [name] triamterene-hydrochlorothiazide (MAXZIDE-25) 37.5-25 MG per tablet Take 0.5 tablets by mouth daily.     . valsartan (DIOVAN) 80 MG tablet Take 40 mg by mouth daily.     [name] zolpidem (AMBIEN) 10 MG tablet Take 5 mg by mouth at bedtime as needed. For insomnia     No current facility-administered medications for this visit.     Allergies  Allergen Reactions  . Augmentin [Amoxicillin-Pot Clavulanate]   . Azithromycin     Or any drug that ends in "mycin"  . Bextra [Valdecoxib]     Unknown  . Cefuroxime Other (See Comments)    dizziness  . Cefuroxime Axetil     Severe dizziness  . Humibid La [Guaifenesin] Other (See Comments)    Mouth sores  . Lipitor [Atorvastatin Calcium]     MYALGIAS  . Ramipril Cough  . Risedronate Sodium      GI ISSUES  . Tramadol Other (See Comments)    Vomiting and diarrhea  . Zocor [Simvastatin]     MYALGIAS  . Niaspan [Niacin Er] Rash  Social History   Social History  . Marital status: Widowed    Spouse name: N/A  . Number of children: N/A  . Years of education: N/A   Occupational History  . Not on file.   Social History Main Topics  . Smoking status: Former Smoker    Quit date: 06/17/1972  . Smokeless tobacco: Never Used     Comment: quit June 17, 1972  . Alcohol use 0.0 oz/week     Comment: 2 drinks per month  . Drug use: No  . Sexual activity: No   Other Topics Concern  . Not on file   Social History Narrative  . No narrative on file     Review of Systems: General: negative for chills, fever, night sweats or weight changes.  Cardiovascular: negative for chest pain, dyspnea on exertion, edema, orthopnea, palpitations, paroxysmal nocturnal dyspnea or shortness of breath Dermatological: negative  for rash Respiratory: negative for cough or wheezing Urologic: negative for hematuria Abdominal: negative for nausea, vomiting, diarrhea, bright red blood per rectum, melena, or hematemesis Neurologic: negative for visual changes, syncope, or dizziness All other systems reviewed and are otherwise negative except as noted above.    Blood pressure (!) 121/57, pulse 69, height '5\' 4"'$  (1.626 m), weight 173 lb 9.6 oz (78.7 kg), last menstrual period 11/19/1983.  General appearance: alert, cooperative and no distress Neck: no carotid bruit and no JVD Lungs: clear to auscultation bilaterally Heart: regular rate and rhythm and 2/6 systolic murmur  AOV Extremities: extremities normal, atraumatic, no cyanosis or edema Neurologic: Grossly normal   ASSESSMENT AND PLAN:   Dizziness Probably secondary to orthostatic B/P and bradycardia. She is improved off Verapamil.   SVT (supraventricular tachycardia) (HCC) History of PSVT, continue Bisoprol  Hypertensive cardiovascular disease Grade 2 DD on echo 2013, EF 55-60% with mild AS June 2017   PLAN  I reviewed each of her medications with her in detail. She apparently had a rash from B12 and stopped this. F/U Dr Stanford Breed in Dec as scheduled.   [name] PA-C 07/18/2016 9:22 AM

## 2016-07-18 NOTE — Patient Instructions (Addendum)
Keep your follow-up appointment with Dr. Stanford Breed on 10/31/16 @ 9:30 am.  If you need a refill on your cardiac medications before your next appointment, please call your pharmacy.

## 2016-07-26 ENCOUNTER — Encounter: Payer: Self-pay | Admitting: Obstetrics and Gynecology

## 2016-07-26 ENCOUNTER — Ambulatory Visit (INDEPENDENT_AMBULATORY_CARE_PROVIDER_SITE_OTHER): Payer: Medicare Other | Admitting: Obstetrics and Gynecology

## 2016-07-26 VITALS — BP 114/66 | HR 64 | Resp 16 | Ht 64.5 in | Wt 174.6 lb

## 2016-07-26 DIAGNOSIS — Z01419 Encounter for gynecological examination (general) (routine) without abnormal findings: Secondary | ICD-10-CM

## 2016-07-26 NOTE — Progress Notes (Signed)
82 y.o. G57P3 Widowed Caucasian female here for annual exam.    Having hair loss.  Normal TSH one month ago.  Has dermatologist - Dr. Ubaldo Glassing and will see her in December.   Taking calcium 600 mg daily with vit D.   Trying to do weight loss over the last 3 weeks.  Joined a Tai Chi class.   Had a pelvic ultrasound last Sept due to limited pelvic exam due to abdominal wall tightening during exam.   PCP:   Deland Pretty, MD  Patient's last menstrual period was 11/19/1983.           Sexually active: No.  The current method of family planning is post menopausal status.    Exercising: No.   Smoker:  no  Health Maintenance: Pap:  07-21-15 Neg History of abnormal Pap:  Yes, 1996 LEEP CIN I MMG:  07-09-16 Density C/Neg/BiRads1:[facility] Colonoscopy:  04/2012 normal with Dr. Bertram Gala out) BMD:   08/2013  Result  Osteopenia Dr. Shelia Media.   TDaP:  2014 Gardasil:   N/A   Screening Labs:  Hb today: PCP, Urine today: PCP   reports that she quit smoking about 44 years ago. She has never used smokeless tobacco. She reports that she drinks alcohol. She reports that she does not use drugs.  Past Medical History:  Diagnosis Date  . Adenomatous colon polyp   . Aortic stenosis, mild   . Depression    Tx'd 19 yrs ago when husband passed away  . Diverticulosis   . Heart murmur   . Hyperlipidemia   . Hypertension   . Hypothyroidism    Dr. Dwyane Dee follows pt.--had small nodules but had decreased in size and stopped Synthroid 06/2013  . Osteopenia   . Osteoporosis   . Pure hypercholesterolemia   . SVT (supraventricular tachycardia) (HCC)     Past Surgical History:  Procedure Laterality Date  . CERVICAL BIOPSY  W/ LOOP ELECTRODE EXCISION  1996   CIN I  . COLONOSCOPY  03/2001, 01/2007   2008 - no polyps  . DILATION AND CURETTAGE OF UTERUS    . TONSILLECTOMY      Current Outpatient Prescriptions  Medication Sig Dispense Refill  . aspirin 81 MG tablet Take 81 mg by mouth every  other day.      [name] BIOTIN PO Take 1 tablet by mouth daily.     . bisoprolol (ZEBETA) 5 MG tablet Take 1 tablet (5 mg total) by mouth daily. 90 tablet 2  . calcium-vitamin D (CALCIUM 500+D) 500-200 MG-UNIT per tablet Take 1 tablet by mouth daily with breakfast.    . Cholecalciferol (VITAMIN D3) 10000 UNITS capsule Take 10,000 Units by mouth daily. Takes 1000 units daily (not 10,000 units)    . Coenzyme Q10 (COQ10) 100 MG capsule Take 100 mg by mouth daily.      . Cyanocobalamin (B-12 PO) Take 1 tablet by mouth daily.     [name] ibuprofen (ADVIL) 200 MG tablet Take 400 mg by mouth every 6 (six) hours as needed. For pain    . nitroGLYCERIN (NITROSTAT) 0.4 MG SL tablet Place 1 tablet (0.4 mg total) under the tongue every 5 (five) minutes as needed. For chest pain. Do not exceed 3 tablets in 15 minutes 25 tablet prn  . Omega-3 Fatty Acids (SALMON OIL-1000 PO) Take 1 capsule by mouth daily.     . potassium chloride SA (K-DUR,KLOR-CON) 20 MEQ tablet TAKE 1 TABLET ONCE DAILY. 30 tablet 3  . rosuvastatin (CRESTOR)  10 MG tablet Take 10 mg by mouth daily.    [name] triamterene-hydrochlorothiazide (MAXZIDE-25) 37.5-25 MG per tablet Take 0.5 tablets by mouth daily.     . valsartan (DIOVAN) 80 MG tablet Take 40 mg by mouth daily.     [name] zolpidem (AMBIEN) 10 MG tablet Take 5 mg by mouth at bedtime as needed. For insomnia     No current facility-administered medications for this visit.     Family History  Problem Relation Age of Onset  . Stroke Sister   . Breast cancer Daughter 6    metastatic breast ca    ROS:  Pertinent items are noted in HPI.  Otherwise, a comprehensive ROS was negative.  Exam:   BP 114/66 (BP Location: Right Arm, Patient Position: Sitting, Cuff Size: Normal)   Pulse 64   Resp 16   Ht 5' 4.5" (1.638 m)   Wt 174 lb 9.6 oz (79.2 kg)   LMP 11/19/1983   BMI 29.51 kg/m     General appearance: alert, cooperative and appears stated age Head: Normocephalic, without obvious abnormality,  atraumatic Neck: no adenopathy, supple, symmetrical, trachea midline and thyroid normal to inspection and palpation Lungs: clear to auscultation bilaterally Breasts: normal appearance, no masses or tenderness, No nipple retraction or dimpling, No nipple discharge or bleeding, No axillary or supraclavicular adenopathy Heart: II/VI systolic murmur at left sternal border.  Regular rate and rhythm Abdomen: soft, non-tender; no masses, no organomegaly Extremities: extremities normal, atraumatic, no cyanosis or edema Skin: Skin color, texture, turgor normal. No rashes or lesions Lymph nodes: Cervical, supraclavicular, and axillary nodes normal. No abnormal inguinal nodes palpated Neurologic: Grossly normal  Pelvic: External genitalia:  no lesions              Urethra:  normal appearing urethra with no masses, tenderness or lesions              Bartholins and Skenes: normal                 Vagina: normal appearing vagina with normal color and discharge, no lesions              Cervix: no lesions              Pap taken: No. Bimanual Exam:  Uterus:  normal size, contour, position, consistency, mobility, non-tender              Adnexa: no mass, fullness, tenderness. Bimanual exam limited by patient involuntary abdominal wall tightening.               Rectal exam: Yes.  .  Confirms.              Anus:  normal sphincter tone, no lesions  Chaperone was present for exam.  Assessment:   Well woman visit with normal exam. History of LEEP for CIN I in 1996. Osteopenia.  Family history of daughter with breast cancer who is deceased.  Alopecia.   Plan: Yearly mammogram recommended after age 59.  I recommend 3D mammogram.  Recommended self breast exam.  Pap next year.  Discussed Calcium, Vitamin D, regular exercise program including cardiovascular and weight bearing exercise. BMD with PCP this year.  Will follow up with Dr. Ubaldo Glassing regarding alopecia. Follow up annually and prn.     After visit  summary provided.

## 2016-07-26 NOTE — Patient Instructions (Signed)

## 2016-08-01 ENCOUNTER — Other Ambulatory Visit: Payer: Self-pay | Admitting: Cardiology

## 2016-08-02 NOTE — Telephone Encounter (Signed)
Rx request sent to pharmacy.  

## 2016-10-25 NOTE — Progress Notes (Signed)
HPI: FU paroxysmal supraventricular tachycardia, hypercholesterolemia, and benign hypertensive heart disease. Nuclear study December 2013 showed ejection fraction 73% and normal perfusion. Echocardiogram June 2017 showed normal LV systolic function, mild aortic stenosis (mean gradient 18 mmHg) and mild left atrial enlargement. Verapamil discontinued August 2017 because of orthostatic symptoms and dizziness. Since last seen, there is no dyspnea, chest pain, palpitations, syncope or pedal edema.  Current Outpatient Prescriptions  Medication Sig Dispense Refill  . aspirin 81 MG tablet Take 81 mg by mouth every other day.      [name] BIOTIN PO Take 1 tablet by mouth daily.     . bisoprolol (ZEBETA) 5 MG tablet Take 1 tablet (5 mg total) by mouth daily. 90 tablet 2  . calcium-vitamin D (CALCIUM 500+D) 500-200 MG-UNIT per tablet Take 1 tablet by mouth daily with breakfast.    . Cholecalciferol (VITAMIN D3) 10000 UNITS capsule Take 10,000 Units by mouth daily. Takes 1000 units daily (not 10,000 units)    . ibuprofen (ADVIL) 200 MG tablet Take 400 mg by mouth every 6 (six) hours as needed. For pain    . nitroGLYCERIN (NITROSTAT) 0.4 MG SL tablet Place 1 tablet (0.4 mg total) under the tongue every 5 (five) minutes as needed. For chest pain. Do not exceed 3 tablets in 15 minutes 25 tablet prn  . Omega-3 Fatty Acids (SALMON OIL-1000 PO) Take 1 capsule by mouth daily.     . potassium chloride SA (K-DUR,KLOR-CON) 20 MEQ tablet TAKE 1 TABLET ONCE DAILY. 30 tablet 3  . rosuvastatin (CRESTOR) 10 MG tablet Take 10 mg by mouth daily.    [name] triamterene-hydrochlorothiazide (MAXZIDE-25) 37.5-25 MG per tablet Take 0.5 tablets by mouth daily.     . valsartan (DIOVAN) 80 MG tablet Take 40 mg by mouth daily.     [name] zolpidem (AMBIEN) 10 MG tablet Take 5 mg by mouth at bedtime as needed. For insomnia    . Cyanocobalamin (B-12 PO) Take 1 tablet by mouth daily.      No current facility-administered medications for this  visit.      Past Medical History:  Diagnosis Date  . Adenomatous colon polyp   . Aortic stenosis, mild   . Depression    Tx'd 19 yrs ago when husband passed away  . Diverticulosis   . Heart murmur   . Hyperlipidemia   . Hypertension   . Hypothyroidism    Dr. Dwyane Dee follows pt.--had small nodules but had decreased in size and stopped Synthroid 06/2013  . Osteopenia   . Osteoporosis   . Pure hypercholesterolemia   . SVT (supraventricular tachycardia) (HCC)     Past Surgical History:  Procedure Laterality Date  . CERVICAL BIOPSY  W/ LOOP ELECTRODE EXCISION  1996   CIN I  . COLONOSCOPY  03/2001, 01/2007   2008 - no polyps  . DILATION AND CURETTAGE OF UTERUS    . TONSILLECTOMY      Social History   Social History  . Marital status: Widowed    Spouse name: N/A  . Number of children: N/A  . Years of education: N/A   Occupational History  . Not on file.   Social History Main Topics  . Smoking status: Former Smoker    Quit date: 06/17/1972  . Smokeless tobacco: Never Used     Comment: quit June 17, 1972  . Alcohol use 0.0 oz/week     Comment: 2 drinks per month  . Drug use: No  . Sexual  activity: No   Other Topics Concern  . Not on file   Social History Narrative  . No narrative on file    Family History  Problem Relation Age of Onset  . Stroke Sister   . Breast cancer Daughter 48    metastatic breast ca    ROS: no fevers or chills, productive cough, hemoptysis, dysphasia, odynophagia, melena, hematochezia, dysuria, hematuria, rash, seizure activity, orthopnea, PND, pedal edema, claudication. Remaining systems are negative.  Physical Exam: Well-developed well-nourished in no acute distress.  Skin is warm and dry.  HEENT is normal.  Neck is supple.  Chest is clear to auscultation with normal expansion.  Cardiovascular exam is regular rate and rhythm. 2/6 systolic murmur left sternal border. S2 is not diminished. Abdominal exam nontender or distended. No  masses palpated. Extremities show no edema. neuro grossly intact  A/P  1 Aortic stenosis-she will need follow-up echocardiograms in the future.  2 Supraventricular tachycardia-continue beta blocker. If more frequent episodes in the future we could refer for ablation. Verapamil discontinued because of orthostasis and bradycardia.   3 hypertension-Blood pressure controlled. Continue present medications.  4 hyperlipidemia-continue statin.  Kirk Ruths, MD

## 2016-10-31 ENCOUNTER — Ambulatory Visit (INDEPENDENT_AMBULATORY_CARE_PROVIDER_SITE_OTHER): Payer: Medicare Other | Admitting: Cardiology

## 2016-10-31 ENCOUNTER — Encounter: Payer: Self-pay | Admitting: Cardiology

## 2016-10-31 VITALS — BP 110/58 | HR 66 | Ht 65.0 in | Wt 174.0 lb

## 2016-10-31 DIAGNOSIS — E7800 Pure hypercholesterolemia, unspecified: Secondary | ICD-10-CM | POA: Diagnosis not present

## 2016-10-31 DIAGNOSIS — I471 Supraventricular tachycardia: Secondary | ICD-10-CM | POA: Diagnosis not present

## 2016-10-31 DIAGNOSIS — I350 Nonrheumatic aortic (valve) stenosis: Secondary | ICD-10-CM

## 2016-10-31 NOTE — Patient Instructions (Signed)
Your physician wants you to follow-up in: 6 MONTHS WITH DR CRENSHAW You will receive a reminder letter in the mail two months in advance. If you don't receive a letter, please call our office to schedule the follow-up appointment.   If you need a refill on your cardiac medications before your next appointment, please call your pharmacy.  

## 2016-12-08 ENCOUNTER — Other Ambulatory Visit: Payer: Self-pay | Admitting: Cardiology

## 2016-12-09 NOTE — Telephone Encounter (Signed)
Rx(s) sent to pharmacy electronically.  

## 2017-05-15 DIAGNOSIS — E785 Hyperlipidemia, unspecified: Secondary | ICD-10-CM | POA: Insufficient documentation

## 2017-05-15 DIAGNOSIS — E559 Vitamin D deficiency, unspecified: Secondary | ICD-10-CM | POA: Insufficient documentation

## 2017-05-15 DIAGNOSIS — M810 Age-related osteoporosis without current pathological fracture: Secondary | ICD-10-CM | POA: Insufficient documentation

## 2017-05-19 NOTE — Progress Notes (Signed)
HPI: FU paroxysmal supraventricular tachycardia, hypercholesterolemia, and benign hypertensive heart disease. Nuclear study December 2013 showed ejection fraction 73% and normal perfusion. Echocardiogram June 2017 showed normal LV systolic function, mild aortic stenosis (mean gradient 18 mmHg) and mild left atrial enlargement. Verapamil discontinued August 2017 because of orthostatic symptoms and dizziness. Since last seen, the patient denies any dyspnea on exertion, orthopnea, PND, pedal edema, palpitations, syncope or chest pain.   Current Outpatient Prescriptions  Medication Sig Dispense Refill  . aspirin 81 MG tablet Take 81 mg by mouth every other day.      [name] BIOTIN PO Take 1 tablet by mouth daily.     . bisoprolol (ZEBETA) 5 MG tablet TAKE 1 TABLET ONCE DAILY. 90 tablet 2  . calcium-vitamin D (CALCIUM 500+D) 500-200 MG-UNIT per tablet Take 1 tablet by mouth daily with breakfast.    . Cholecalciferol (VITAMIN D3) 10000 UNITS capsule Take 10,000 Units by mouth daily. Takes 1000 units daily (not 10,000 units)    . Cyanocobalamin (B-12 PO) Take 1 tablet by mouth daily.     [name] ibuprofen (ADVIL) 200 MG tablet Take 400 mg by mouth every 6 (six) hours as needed. For pain    . nitroGLYCERIN (NITROSTAT) 0.4 MG SL tablet Place 1 tablet (0.4 mg total) under the tongue every 5 (five) minutes as needed. For chest pain. Do not exceed 3 tablets in 15 minutes 25 tablet prn  . Omega-3 Fatty Acids (SALMON OIL-1000 PO) Take 1 capsule by mouth daily.     . potassium chloride SA (K-DUR,KLOR-CON) 20 MEQ tablet TAKE 1 TABLET ONCE DAILY. 30 tablet 10  . rosuvastatin (CRESTOR) 10 MG tablet Take 10 mg by mouth daily.    [name] triamterene-hydrochlorothiazide (MAXZIDE-25) 37.5-25 MG per tablet Take 0.5 tablets by mouth daily.     . valsartan (DIOVAN) 80 MG tablet Take 40 mg by mouth daily.     [name] zolpidem (AMBIEN) 10 MG tablet Take 5 mg by mouth at bedtime as needed. For insomnia     No current  facility-administered medications for this visit.      Past Medical History:  Diagnosis Date  . Adenomatous colon polyp   . Aortic stenosis, mild   . Depression    Tx'd 19 yrs ago when husband passed away  . Diverticulosis   . Heart murmur   . Hyperlipidemia   . Hypertension   . Hypothyroidism    Dr. Dwyane Dee follows pt.--had small nodules but had decreased in size and stopped Synthroid 06/2013  . Osteopenia   . Osteoporosis   . Pure hypercholesterolemia   . SVT (supraventricular tachycardia) (HCC)     Past Surgical History:  Procedure Laterality Date  . CERVICAL BIOPSY  W/ LOOP ELECTRODE EXCISION  1996   CIN I  . COLONOSCOPY  03/2001, 01/2007   2008 - no polyps  . DILATION AND CURETTAGE OF UTERUS    . TONSILLECTOMY      Social History   Social History  . Marital status: Widowed    Spouse name: N/A  . Number of children: N/A  . Years of education: N/A   Occupational History  . Not on file.   Social History Main Topics  . Smoking status: Former Smoker    Quit date: 06/17/1972  . Smokeless tobacco: Never Used     Comment: quit June 17, 1972  . Alcohol use 0.0 oz/week     Comment: 2 drinks per month  . Drug use: No  .  Sexual activity: No   Other Topics Concern  . Not on file   Social History Narrative  . No narrative on file    Family History  Problem Relation Age of Onset  . Stroke Sister   . Breast cancer Daughter 34       metastatic breast ca    ROS: no fevers or chills, productive cough, hemoptysis, dysphasia, odynophagia, melena, hematochezia, dysuria, hematuria, rash, seizure activity, orthopnea, PND, pedal edema, claudication. Remaining systems are negative.  Physical Exam: Well-developed well-nourished in no acute distress.  Skin is warm and dry.  HEENT is normal.  Neck is supple.  Chest is clear to auscultation with normal expansion.  Cardiovascular exam is regular rate and [id number]/9 systolic murmur LSB Abdominal exam nontender or  distended. No masses palpated. Extremities show no edema. neuro grossly intact  ECG- Sinus rhythm at a rate of 64. Left ventricular hypertrophy. personally reviewed  A/P  1 Aortic stenosis-patient will require follow-up echocardiograms in the future.   2 hypertension-blood pressure is controlled. Continue present medications.  3 supraventricular tachycardia-verapamil was discontinued previously because of low blood pressure and bradycardia. Continue beta blocker. We can consider referral for ablation in the future if episodes become more frequent.   4 hyperlipidemia-continue statin.    Kirk Ruths, MD

## 2017-05-26 ENCOUNTER — Telehealth: Payer: Self-pay | Admitting: Obstetrics and Gynecology

## 2017-05-26 NOTE — Telephone Encounter (Signed)
Left patient a message to call back to reschedule a future appointment that was cancelled by the provider for AEX on 07/30/17.

## 2017-05-30 ENCOUNTER — Ambulatory Visit: Payer: Medicare Other | Admitting: Cardiology

## 2017-06-02 ENCOUNTER — Ambulatory Visit (INDEPENDENT_AMBULATORY_CARE_PROVIDER_SITE_OTHER): Payer: Medicare Other | Admitting: Cardiology

## 2017-06-02 ENCOUNTER — Encounter: Payer: Self-pay | Admitting: Cardiology

## 2017-06-02 ENCOUNTER — Other Ambulatory Visit: Payer: Self-pay | Admitting: Obstetrics and Gynecology

## 2017-06-02 VITALS — BP 120/60 | HR 64 | Ht 65.0 in | Wt 167.0 lb

## 2017-06-02 DIAGNOSIS — Z1231 Encounter for screening mammogram for malignant neoplasm of breast: Secondary | ICD-10-CM

## 2017-06-02 DIAGNOSIS — I350 Nonrheumatic aortic (valve) stenosis: Secondary | ICD-10-CM

## 2017-06-02 DIAGNOSIS — I10 Essential (primary) hypertension: Secondary | ICD-10-CM | POA: Diagnosis not present

## 2017-06-02 DIAGNOSIS — Z8679 Personal history of other diseases of the circulatory system: Secondary | ICD-10-CM | POA: Diagnosis not present

## 2017-06-02 NOTE — Patient Instructions (Signed)
Your physician wants you to follow-up in: 6 MONTHS WITH DR CRENSHAW You will receive a reminder letter in the mail two months in advance. If you don't receive a letter, please call our office to schedule the follow-up appointment.   If you need a refill on your cardiac medications before your next appointment, please call your pharmacy.  

## 2017-07-07 ENCOUNTER — Ambulatory Visit: Payer: Medicare Other

## 2017-07-16 ENCOUNTER — Ambulatory Visit
Admission: RE | Admit: 2017-07-16 | Discharge: 2017-07-16 | Disposition: A | Discharge status: Home / Self Care | Payer: Medicare Other | Source: Ambulatory Visit | Attending: Obstetrics and Gynecology | Admitting: Obstetrics and Gynecology

## 2017-07-16 DIAGNOSIS — Z1231 Encounter for screening mammogram for malignant neoplasm of breast: Secondary | ICD-10-CM

## 2017-07-30 ENCOUNTER — Ambulatory Visit: Payer: Medicare Other | Admitting: Obstetrics and Gynecology

## 2017-09-16 ENCOUNTER — Other Ambulatory Visit: Payer: Self-pay | Admitting: Cardiology

## 2017-09-16 NOTE — Telephone Encounter (Signed)
Rx(s) sent to pharmacy electronically.  

## 2017-09-17 ENCOUNTER — Telehealth: Payer: Self-pay | Admitting: Obstetrics and Gynecology

## 2017-09-17 ENCOUNTER — Ambulatory Visit: Payer: Medicare Other | Admitting: Obstetrics and Gynecology

## 2017-09-17 NOTE — Telephone Encounter (Signed)
Patient cancelled aex appointment today because she is sick. Will call back to reschedule. No dnka letter mailed.

## 2017-09-17 NOTE — Progress Notes (Deleted)
83 y.o. G71P3 Widowed Caucasian female here for annual exam.    PCP:     Patient's last menstrual period was 11/19/1983.           Sexually active: {yes no:314532}  The current method of family planning is post menopausal status.    Exercising: {yes no:314532}  {types:19826} Smoker:  no  Health Maintenance: Pap: 07-21-15 Neg, 03-09-14 Neg History of abnormal Pap:  Yes, 1996 LEEP CIN I MMG: 07-16-17 Density C/Neg/BiRads1:TBC Colonoscopy:  04/2012 normal with Dr. Bertram Gala out) BMD: 08/2013  Result :Osteopenia Dr.Pharr TDaP:  2014 Gardasil:   no HIV: *** Hep C:*** Screening Labs:  Hb today: ***, Urine today: ***   reports that she quit smoking about 45 years ago. She has never used smokeless tobacco. She reports that she drinks alcohol. She reports that she does not use drugs.  Past Medical History:  Diagnosis Date  . Adenomatous colon polyp   . Aortic stenosis, mild   . Depression    Tx'd 19 yrs ago when husband passed away  . Diverticulosis   . Heart murmur   . Hyperlipidemia   . Hypertension   . Hypothyroidism    Dr. Dwyane Dee follows pt.--had small nodules but had decreased in size and stopped Synthroid 06/2013  . Osteopenia   . Osteoporosis   . Pure hypercholesterolemia   . SVT (supraventricular tachycardia) (HCC)     Past Surgical History:  Procedure Laterality Date  . CERVICAL BIOPSY  W/ LOOP ELECTRODE EXCISION  1996   CIN I  . COLONOSCOPY  03/2001, 01/2007   2008 - no polyps  . DILATION AND CURETTAGE OF UTERUS    . TONSILLECTOMY      Current Outpatient Prescriptions  Medication Sig Dispense Refill  . aspirin 81 MG tablet Take 81 mg by mouth every other day.      [name] BIOTIN PO Take 1 tablet by mouth daily.     . bisoprolol (ZEBETA) 5 MG tablet TAKE 1 TABLET ONCE DAILY. 90 tablet 2  . calcium-vitamin D (CALCIUM 500+D) 500-200 MG-UNIT per tablet Take 1 tablet by mouth daily with breakfast.    . Cholecalciferol (VITAMIN D3) 10000 UNITS capsule Take 10,000 Units  by mouth daily. Takes 1000 units daily (not 10,000 units)    . Cyanocobalamin (B-12 PO) Take 1 tablet by mouth daily.     [name] ibuprofen (ADVIL) 200 MG tablet Take 400 mg by mouth every 6 (six) hours as needed. For pain    . nitroGLYCERIN (NITROSTAT) 0.4 MG SL tablet Place 1 tablet (0.4 mg total) under the tongue every 5 (five) minutes as needed. For chest pain. Do not exceed 3 tablets in 15 minutes 25 tablet prn  . Omega-3 Fatty Acids (SALMON OIL-1000 PO) Take 1 capsule by mouth daily.     . potassium chloride SA (K-DUR,KLOR-CON) 20 MEQ tablet TAKE 1 TABLET ONCE DAILY. 30 tablet 10  . rosuvastatin (CRESTOR) 10 MG tablet Take 10 mg by mouth daily.    [name] triamterene-hydrochlorothiazide (MAXZIDE-25) 37.5-25 MG per tablet Take 0.5 tablets by mouth daily.     . valsartan (DIOVAN) 80 MG tablet Take 40 mg by mouth daily.     [name] zolpidem (AMBIEN) 10 MG tablet Take 5 mg by mouth at bedtime as needed. For insomnia     No current facility-administered medications for this visit.     Family History  Problem Relation Age of Onset  . Stroke Sister   . Breast cancer Sister   . Breast  cancer Daughter 76       metastatic breast ca    ROS:  Pertinent items are noted in HPI.  Otherwise, a comprehensive ROS was negative.  Exam:   LMP 11/19/1983     General appearance: alert, cooperative and appears stated age Head: Normocephalic, without obvious abnormality, atraumatic Neck: no adenopathy, supple, symmetrical, trachea midline and thyroid normal to inspection and palpation Lungs: clear to auscultation bilaterally Breasts: normal appearance, no masses or tenderness, No nipple retraction or dimpling, No nipple discharge or bleeding, No axillary or supraclavicular adenopathy Heart: regular rate and rhythm Abdomen: soft, non-tender; no masses, no organomegaly Extremities: extremities normal, atraumatic, no cyanosis or edema Skin: Skin color, texture, turgor normal. No rashes or lesions Lymph nodes: Cervical,  supraclavicular, and axillary nodes normal. No abnormal inguinal nodes palpated Neurologic: Grossly normal  Pelvic: External genitalia:  no lesions              Urethra:  normal appearing urethra with no masses, tenderness or lesions              Bartholins and Skenes: normal                 Vagina: normal appearing vagina with normal color and discharge, no lesions              Cervix: no lesions              Pap taken: {yes no:314532} Bimanual Exam:  Uterus:  normal size, contour, position, consistency, mobility, non-tender              Adnexa: no mass, fullness, tenderness              Rectal exam: {yes no:314532}.  Confirms.              Anus:  normal sphincter tone, no lesions  Chaperone was present for exam.  Assessment:   Well woman visit with normal exam.   Plan: Mammogram screening discussed. Recommended self breast awareness. Pap and HR HPV as above. Guidelines for Calcium, Vitamin D, regular exercise program including cardiovascular and weight bearing exercise.   Follow up annually and prn.   Additional counseling given.  {yes [id number]. _______ minutes face to face time of which over 50% was spent in counseling.    After visit summary provided.

## 2017-09-17 NOTE — Telephone Encounter (Signed)
Thank you for the update.  Encounter closed. 

## 2017-12-02 ENCOUNTER — Ambulatory Visit: Payer: Medicare Other | Admitting: Cardiology

## 2017-12-02 ENCOUNTER — Encounter: Payer: Self-pay | Admitting: Cardiology

## 2017-12-02 VITALS — BP 118/58 | HR 64 | Ht 65.0 in

## 2017-12-02 DIAGNOSIS — I471 Supraventricular tachycardia: Secondary | ICD-10-CM

## 2017-12-02 DIAGNOSIS — I350 Nonrheumatic aortic (valve) stenosis: Secondary | ICD-10-CM | POA: Diagnosis not present

## 2017-12-02 DIAGNOSIS — I119 Hypertensive heart disease without heart failure: Secondary | ICD-10-CM

## 2017-12-02 NOTE — Patient Instructions (Signed)

## 2017-12-02 NOTE — Progress Notes (Signed)
HPI: FU paroxysmal supraventricular tachycardia, hypercholesterolemia, and benign hypertensive heart disease.Nuclear study December 2013 showed ejection fraction 73% and normal perfusion. Echocardiogram June 2017 showed normal LV systolic function, mild aortic stenosis (mean gradient 18 mmHg)and mild left atrial enlargement. Verapamil discontinued August 2017 because of orthostatic symptoms and dizziness. Since last seen, the patient denies any dyspnea on exertion, orthopnea, PND, pedal edema, palpitations, syncope or chest pain.   Current Outpatient Medications  Medication Sig Dispense Refill  . aspirin 81 MG tablet Take 81 mg by mouth every other day.      [name] BIOTIN PO Take 1 tablet by mouth daily.     . bisoprolol (ZEBETA) 5 MG tablet TAKE 1 TABLET ONCE DAILY. 90 tablet 2  . calcium-vitamin D (CALCIUM 500+D) 500-200 MG-UNIT per tablet Take 1 tablet by mouth daily with breakfast.    . Cholecalciferol (VITAMIN D3) 10000 UNITS capsule Take 10,000 Units by mouth daily. Takes 1000 units daily (not 10,000 units)    . Cyanocobalamin (B-12 PO) Take 1 tablet by mouth daily.     [name] ibuprofen (ADVIL) 200 MG tablet Take 400 mg by mouth every 6 (six) hours as needed. For pain    . nitroGLYCERIN (NITROSTAT) 0.4 MG SL tablet Place 1 tablet (0.4 mg total) under the tongue every 5 (five) minutes as needed. For chest pain. Do not exceed 3 tablets in 15 minutes 25 tablet prn  . Omega-3 Fatty Acids (SALMON OIL-1000 PO) Take 1 capsule by mouth daily.     . potassium chloride SA (K-DUR,KLOR-CON) 20 MEQ tablet TAKE 1 TABLET ONCE DAILY. 30 tablet 10  . rosuvastatin (CRESTOR) 10 MG tablet Take 10 mg by mouth daily.    [name] triamterene-hydrochlorothiazide (MAXZIDE-25) 37.5-25 MG per tablet Take 0.5 tablets by mouth daily.     . valsartan (DIOVAN) 80 MG tablet Take 40 mg by mouth daily.     [name] zolpidem (AMBIEN) 10 MG tablet Take 5 mg by mouth at bedtime as needed. For insomnia     No current facility-administered  medications for this visit.      Past Medical History:  Diagnosis Date  . Adenomatous colon polyp   . Aortic stenosis, mild   . Depression    Tx'd 19 yrs ago when husband passed away  . Diverticulosis   . Heart murmur   . Hyperlipidemia   . Hypertension   . Hypothyroidism    Dr. Dwyane Dee follows pt.--had small nodules but had decreased in size and stopped Synthroid 06/2013  . Osteopenia   . Osteoporosis   . Pure hypercholesterolemia   . SVT (supraventricular tachycardia) (HCC)     Past Surgical History:  Procedure Laterality Date  . CERVICAL BIOPSY  W/ LOOP ELECTRODE EXCISION  1996   CIN I  . COLONOSCOPY  03/2001, 01/2007   2008 - no polyps  . DILATION AND CURETTAGE OF UTERUS    . TONSILLECTOMY      Social History   Socioeconomic History  . Marital status: Widowed    Spouse name: Not on file  . Number of children: Not on file  . Years of education: Not on file  . Highest education level: Not on file  Social Needs  . Financial resource strain: Not on file  . Food insecurity - worry: Not on file  . Food insecurity - inability: Not on file  . Transportation needs - medical: Not on file  . Transportation needs - non-medical: Not on file  Occupational History  .  Not on file  Tobacco Use  . Smoking status: Former Smoker    Last attempt to quit: 06/17/1972    Years since quitting: 45.4  . Smokeless tobacco: Never Used  . Tobacco comment: quit June 17, 1972  Substance and Sexual Activity  . Alcohol use: Yes    Alcohol/week: 0.0 oz    Comment: 2 drinks per month  . Drug use: No  . Sexual activity: No    Partners: Male    Birth control/protection: Post-menopausal  Other Topics Concern  . Not on file  Social History Narrative  . Not on file    Family History  Problem Relation Age of Onset  . Stroke Sister   . Breast cancer Sister   . Breast cancer Daughter 73       metastatic breast ca    ROS: no fevers or chills, productive cough, hemoptysis, dysphasia,  odynophagia, melena, hematochezia, dysuria, hematuria, rash, seizure activity, orthopnea, PND, pedal edema, claudication. Remaining systems are negative.  Physical Exam: Well-developed well-nourished in no acute distress.  Skin is warm and dry.  HEENT is normal.  Neck is supple.  Chest is clear to auscultation with normal expansion.  Cardiovascular exam is regular rate and rhythm. 2/6 systolic murmur left sternal border. S2 is not diminished. Abdominal exam nontender or distended. No masses palpated. Extremities show no edema. neuro grossly intact  A/P  1 supraventricular tachycardia-patient has not had any recent episodes. We will continue with beta blockade. We can consider referral for ablation in the future if she has more frequent episodes.  2 aortic stenosis-plan follow-up echocardiogram.  3 hypertension-blood pressure is controlled. Continue present medications. Potassium and renal function monitored by primary care.  4 hyperlipidemia-continue statin. Lipids and liver monitored by primary care.  Kirk Ruths, MD

## 2017-12-09 ENCOUNTER — Ambulatory Visit (HOSPITAL_COMMUNITY): Payer: Medicare Other | Attending: Cardiology

## 2017-12-09 ENCOUNTER — Other Ambulatory Visit: Payer: Self-pay

## 2017-12-09 DIAGNOSIS — I10 Essential (primary) hypertension: Secondary | ICD-10-CM | POA: Diagnosis not present

## 2017-12-09 DIAGNOSIS — E785 Hyperlipidemia, unspecified: Secondary | ICD-10-CM | POA: Diagnosis not present

## 2017-12-09 DIAGNOSIS — I350 Nonrheumatic aortic (valve) stenosis: Secondary | ICD-10-CM | POA: Diagnosis not present

## 2017-12-11 ENCOUNTER — Other Ambulatory Visit: Payer: Self-pay | Admitting: *Deleted

## 2017-12-11 DIAGNOSIS — I350 Nonrheumatic aortic (valve) stenosis: Secondary | ICD-10-CM

## 2018-01-06 ENCOUNTER — Ambulatory Visit: Payer: Medicare Other | Admitting: Cardiology

## 2018-06-24 ENCOUNTER — Other Ambulatory Visit: Payer: Self-pay | Admitting: Obstetrics and Gynecology

## 2018-06-24 DIAGNOSIS — Z1231 Encounter for screening mammogram for malignant neoplasm of breast: Secondary | ICD-10-CM

## 2018-06-29 ENCOUNTER — Other Ambulatory Visit: Payer: Self-pay | Admitting: Cardiology

## 2018-07-21 ENCOUNTER — Ambulatory Visit
Admission: RE | Admit: 2018-07-21 | Discharge: 2018-07-21 | Disposition: A | Discharge status: Home / Self Care | Payer: Medicare Other | Source: Ambulatory Visit | Attending: Obstetrics and Gynecology | Admitting: Obstetrics and Gynecology

## 2018-07-21 DIAGNOSIS — Z1231 Encounter for screening mammogram for malignant neoplasm of breast: Secondary | ICD-10-CM

## 2018-07-28 NOTE — Progress Notes (Signed)
HPI: FU paroxysmal supraventricular tachycardia, AS, hypercholesterolemia, and benign hypertensive heart disease.Nuclear study December 2013 showed ejection fraction 73% and normal perfusion. Echo 1/19 showed normal LV function, mild diastolic dysfunction, moderate AS with mean gradient 36 mmHg. Since last seen,patient has dyspnea with more vigorous activities but not routine activities.  No orthopnea, PND, pedal edema, chest pain or syncope.  Current Outpatient Medications  Medication Sig Dispense Refill  . amoxicillin (AMOXIL) 500 MG capsule Take 2,000 mg by mouth daily. TAKE 4 CAPSULES PRIOR TO DENTAL PROCEDURES.    [name] BIOTIN PO Take 1 tablet by mouth daily.     . bisoprolol (ZEBETA) 5 MG tablet TAKE 1 TABLET ONCE DAILY. 90 tablet 2  . calcium-vitamin D (CALCIUM 500+D) 500-200 MG-UNIT per tablet Take 1 tablet by mouth daily with breakfast.    . Cholecalciferol (VITAMIN D3) 10000 UNITS capsule Take 10,000 Units by mouth daily. Takes 1000 units daily (not 10,000 units)    . Cyanocobalamin (B-12 PO) Take 1 tablet by mouth daily.     [name] ibuprofen (ADVIL) 200 MG tablet Take 400 mg by mouth every 6 (six) hours as needed. For pain    . nitroGLYCERIN (NITROSTAT) 0.4 MG SL tablet Place 1 tablet (0.4 mg total) under the tongue every 5 (five) minutes as needed. For chest pain. Do not exceed 3 tablets in 15 minutes 25 tablet prn  . Omega-3 Fatty Acids (SALMON OIL-1000 PO) Take 1 capsule by mouth daily.     . potassium chloride SA (K-DUR,KLOR-CON) 20 MEQ tablet TAKE 1 TABLET ONCE DAILY. 30 tablet 10  . triamterene-hydrochlorothiazide (MAXZIDE-25) 37.5-25 MG per tablet Take 0.5 tablets by mouth daily.     . valsartan (DIOVAN) 80 MG tablet Take 40 mg by mouth daily.     [name] zolpidem (AMBIEN) 10 MG tablet Take 5 mg by mouth at bedtime as needed. For insomnia     No current facility-administered medications for this visit.      Past Medical History:  Diagnosis Date  . Adenomatous colon polyp   .  Aortic stenosis, mild   . Depression    Tx'd 19 yrs ago when husband passed away  . Diverticulosis   . Heart murmur   . Hyperlipidemia   . Hypertension   . Hypothyroidism    Dr. Dwyane Dee follows pt.--had small nodules but had decreased in size and stopped Synthroid 06/2013  . Osteopenia   . Osteoporosis   . Pure hypercholesterolemia   . SVT (supraventricular tachycardia) (HCC)     Past Surgical History:  Procedure Laterality Date  . CERVICAL BIOPSY  W/ LOOP ELECTRODE EXCISION  1996   CIN I  . COLONOSCOPY  03/2001, 01/2007   2008 - no polyps  . DILATION AND CURETTAGE OF UTERUS    . TONSILLECTOMY      Social History   Socioeconomic History  . Marital status: Widowed    Spouse name: Not on file  . Number of children: Not on file  . Years of education: Not on file  . Highest education level: Not on file  Occupational History  . Not on file  Social Needs  . Financial resource strain: Not on file  . Food insecurity:    Worry: Not on file    Inability: Not on file  . Transportation needs:    Medical: Not on file    Non-medical: Not on file  Tobacco Use  . Smoking status: Former Smoker    Last attempt to quit:  06/17/1972    Years since quitting: 46.1  . Smokeless tobacco: Never Used  . Tobacco comment: quit June 17, 1972  Substance and Sexual Activity  . Alcohol use: Yes    Alcohol/week: 0.0 standard drinks    Comment: 2 drinks per month  . Drug use: No  . Sexual activity: Never    Partners: Male    Birth control/protection: Post-menopausal  Lifestyle  . Physical activity:    Days per week: Not on file    Minutes per session: Not on file  . Stress: Not on file  Relationships  . Social connections:    Talks on phone: Not on file    Gets together: Not on file    Attends religious service: Not on file    Active member of club or organization: Not on file    Attends meetings of clubs or organizations: Not on file    Relationship status: Not on file  . Intimate  partner violence:    Fear of current or ex partner: Not on file    Emotionally abused: Not on file    Physically abused: Not on file    Forced sexual activity: Not on file  Other Topics Concern  . Not on file  Social History Narrative  . Not on file    Family History  Problem Relation Age of Onset  . Stroke Sister   . Breast cancer Sister   . Breast cancer Daughter 62       metastatic breast ca    ROS: no fevers or chills, productive cough, hemoptysis, dysphasia, odynophagia, melena, hematochezia, dysuria, hematuria, rash, seizure activity, orthopnea, PND, pedal edema, claudication. Remaining systems are negative.  Physical Exam: Well-developed well-nourished in no acute distress.  Skin is warm and dry.  HEENT is normal.  Neck is supple.  Chest is clear to auscultation with normal expansion.  Cardiovascular exam is regular rate and rhythm.  3-5/5 systolic murmur left sternal border. Abdominal exam nontender or distended. No masses palpated. Extremities show no edema. neuro grossly intact  ECG-sinus rhythm at a rate of 62.  First-degree AV block.  Left ventricular hypertrophy.  No ST changes.  Personally reviewed  A/P  1 aortic stenosis-patient denies symptoms.  We will repeat echocardiogram January 2020.  She understands she may require TAVR in the future.  We discussed symptoms to be aware of including worsening dyspnea, chest pain or syncope.  2 supraventricular tachycardia-we will continue with present dose of beta-blockade.  No recent episodes.  Can refer for ablation in the future if symptoms worsen.  3 hypertension-blood pressure is controlled.  Continue present medications.  4 hyperlipidemia-Management per primary care.  Kirk Ruths, MD

## 2018-07-30 ENCOUNTER — Encounter: Payer: Self-pay | Admitting: Cardiology

## 2018-07-30 ENCOUNTER — Ambulatory Visit: Payer: Medicare Other | Admitting: Cardiology

## 2018-07-30 VITALS — BP 120/60 | HR 62 | Ht 65.0 in | Wt 163.0 lb

## 2018-07-30 DIAGNOSIS — I350 Nonrheumatic aortic (valve) stenosis: Secondary | ICD-10-CM

## 2018-07-30 DIAGNOSIS — I471 Supraventricular tachycardia: Secondary | ICD-10-CM | POA: Diagnosis not present

## 2018-07-30 DIAGNOSIS — I10 Essential (primary) hypertension: Secondary | ICD-10-CM | POA: Diagnosis not present

## 2018-07-30 NOTE — Patient Instructions (Signed)
Medication Instructions: Your physician recommends that you continue on your current medications as directed.    If you need a refill on your cardiac medications before your next appointment, please call your pharmacy.   Labwork: None  Procedures/Testing: Your physician has requested that you have an echocardiogram. Echocardiography is a painless test that uses sound waves to create images of your heart. It provides your doctor with information about the size and shape of your heart and how well your heart's chambers and valves are working. This procedure takes approximately one hour. There are no restrictions for this procedure. [place] [address]   Follow-Up: Your physician wants you to follow-up with Dr. Stanford Breed after ECHO. Special Instructions:    Thank you for choosing [facility] at [facility]!!

## 2018-08-10 ENCOUNTER — Other Ambulatory Visit (HOSPITAL_COMMUNITY): Payer: Medicare Other

## 2018-11-25 ENCOUNTER — Ambulatory Visit (HOSPITAL_COMMUNITY): Payer: Medicare Other | Attending: Cardiology

## 2018-11-25 ENCOUNTER — Encounter (INDEPENDENT_AMBULATORY_CARE_PROVIDER_SITE_OTHER): Payer: Self-pay

## 2018-11-25 ENCOUNTER — Other Ambulatory Visit: Payer: Self-pay

## 2018-11-25 DIAGNOSIS — I350 Nonrheumatic aortic (valve) stenosis: Secondary | ICD-10-CM | POA: Diagnosis not present

## 2019-01-01 NOTE — Progress Notes (Signed)
HPI: FU paroxysmal supraventricular tachycardia, AS, hypercholesterolemia, and benign hypertensive heart disease.Nuclear study December 2013 showed ejection fraction 73% and normal perfusion. Echocardiogram repeated January 2020 and showed vigorous LV function and moderate aortic stenosis with mean gradient 34 mmHg.  Since last seen, the patient has dyspnea with more extreme activities but not with routine activities. It is relieved with rest. It is not associated with chest pain. There is no orthopnea, PND or pedal edema. There is no syncope or palpitations. There is no exertional chest pain.   Current Outpatient Medications  Medication Sig Dispense Refill  . amoxicillin (AMOXIL) 500 MG capsule Take 2,000 mg by mouth daily. TAKE 4 CAPSULES PRIOR TO DENTAL PROCEDURES.    [name] BIOTIN PO Take 1 tablet by mouth daily.     . bisoprolol (ZEBETA) 5 MG tablet TAKE 1 TABLET ONCE DAILY. 90 tablet 2  . calcium-vitamin D (CALCIUM 500+D) 500-200 MG-UNIT per tablet Take 1 tablet by mouth daily with breakfast.    . Cholecalciferol (VITAMIN D3) 10000 UNITS capsule Take 10,000 Units by mouth daily. Takes 1000 units daily (not 10,000 units)    . ibuprofen (ADVIL) 200 MG tablet Take 400 mg by mouth every 6 (six) hours as needed. For pain    . potassium chloride SA (K-DUR,KLOR-CON) 20 MEQ tablet TAKE 1 TABLET ONCE DAILY. 30 tablet 10  . triamterene-hydrochlorothiazide (MAXZIDE-25) 37.5-25 MG per tablet Take 0.5 tablets by mouth daily.     . valsartan (DIOVAN) 80 MG tablet Take 40 mg by mouth daily.     [name] zolpidem (AMBIEN) 10 MG tablet Take 5 mg by mouth at bedtime as needed. For insomnia     No current facility-administered medications for this visit.      Past Medical History:  Diagnosis Date  . Adenomatous colon polyp   . Aortic stenosis, mild   . Depression    Tx'd 19 yrs ago when husband passed away  . Diverticulosis   . Heart murmur   . Hyperlipidemia   . Hypertension   . Hypothyroidism    Dr. Dwyane Dee follows pt.--had small nodules but had decreased in size and stopped Synthroid 06/2013  . Osteopenia   . Osteoporosis   . Pure hypercholesterolemia   . SVT (supraventricular tachycardia) (HCC)     Past Surgical History:  Procedure Laterality Date  . CERVICAL BIOPSY  W/ LOOP ELECTRODE EXCISION  1996   CIN I  . COLONOSCOPY  03/2001, 01/2007   2008 - no polyps  . DILATION AND CURETTAGE OF UTERUS    . TONSILLECTOMY      Social History   Socioeconomic History  . Marital status: Widowed    Spouse name: Not on file  . Number of children: Not on file  . Years of education: Not on file  . Highest education level: Not on file  Occupational History  . Not on file  Social Needs  . Financial resource strain: Not on file  . Food insecurity:    Worry: Not on file    Inability: Not on file  . Transportation needs:    Medical: Not on file    Non-medical: Not on file  Tobacco Use  . Smoking status: Former Smoker    Last attempt to quit: 06/17/1972    Years since quitting: 46.6  . Smokeless tobacco: Never Used  . Tobacco comment: quit June 17, 1972  Substance and Sexual Activity  . Alcohol use: Yes    Alcohol/week: 0.0 standard drinks  Comment: 2 drinks per month  . Drug use: No  . Sexual activity: Never    Partners: Male    Birth control/protection: Post-menopausal  Lifestyle  . Physical activity:    Days per week: Not on file    Minutes per session: Not on file  . Stress: Not on file  Relationships  . Social connections:    Talks on phone: Not on file    Gets together: Not on file    Attends religious service: Not on file    Active member of club or organization: Not on file    Attends meetings of clubs or organizations: Not on file    Relationship status: Not on file  . Intimate partner violence:    Fear of current or ex partner: Not on file    Emotionally abused: Not on file    Physically abused: Not on file    Forced sexual activity: Not on file  Other  Topics Concern  . Not on file  Social History Narrative  . Not on file    Family History  Problem Relation Age of Onset  . Stroke Sister   . Breast cancer Sister   . Breast cancer Daughter 6       metastatic breast ca    ROS: no fevers or chills, productive cough, hemoptysis, dysphasia, odynophagia, melena, hematochezia, dysuria, hematuria, rash, seizure activity, orthopnea, PND, pedal edema, claudication. Remaining systems are negative.  Physical Exam: Well-developed well-nourished in no acute distress.  Skin is warm and dry.  HEENT is normal.  Neck is supple.  Chest is clear to auscultation with normal expansion.  Cardiovascular exam is regular rate and rhythm. 2/6 systolic  murmur Abdominal exam nontender or distended. No masses palpated. Extremities show no edema. neuro grossly intact  A/P  1 aortic stenosis-moderate on most recent echocardiogram.  She remains asymptomatic with no dyspnea, chest pain or syncope.  We will plan to repeat her echocardiogram January 2021 or sooner if she develops symptoms.  She understands that she may require TAVR in the future.  2 supraventricular tachycardia-patient has had no recent episodes of SVT.  Continue beta-blocker.  Can consider referral to electrophysiology for consideration of ablation if she has more frequent episodes in the future.  3 hypertension-patient's blood pressure is controlled today.  Continue present medications and follow.  4 hyperlipidemia-followed by primary care.  Kirk Ruths, MD

## 2019-01-15 ENCOUNTER — Encounter (INDEPENDENT_AMBULATORY_CARE_PROVIDER_SITE_OTHER): Payer: Self-pay

## 2019-01-15 ENCOUNTER — Encounter: Payer: Self-pay | Admitting: Cardiology

## 2019-01-15 ENCOUNTER — Ambulatory Visit: Payer: Medicare Other | Admitting: Cardiology

## 2019-01-15 VITALS — BP 136/62 | HR 64 | Ht 65.0 in

## 2019-01-15 DIAGNOSIS — I10 Essential (primary) hypertension: Secondary | ICD-10-CM | POA: Diagnosis not present

## 2019-01-15 DIAGNOSIS — I350 Nonrheumatic aortic (valve) stenosis: Secondary | ICD-10-CM | POA: Diagnosis not present

## 2019-01-15 DIAGNOSIS — E7800 Pure hypercholesterolemia, unspecified: Secondary | ICD-10-CM | POA: Diagnosis not present

## 2019-01-15 DIAGNOSIS — I471 Supraventricular tachycardia: Secondary | ICD-10-CM

## 2019-01-15 NOTE — Patient Instructions (Signed)
Medication Instructions:  NO CHANGE If you need a refill on your cardiac medications before your next appointment, please call your pharmacy.   Lab work: If you have labs (blood work) drawn today and your tests are completely normal, you will receive your results only by: [name] MyChart Message (if you have MyChart) OR . A paper copy in the mail If you have any lab test that is abnormal or we need to change your treatment, we will call you to review the results.  Follow-Up: At [facility], you and your health needs are our priority.  As part of our continuing mission to provide you with exceptional heart care, we have created designated Provider Care Teams.  These Care Teams include your primary Cardiologist (physician) and Advanced Practice Providers (APPs -  Physician Assistants and Nurse Practitioners) who all work together to provide you with the care you need, when you need it. You will need a follow up appointment in 6 months.  Please call our office 2 months in advance to schedule this appointment.  You may see Kirk Ruths MD or one of the following Advanced Practice Providers on your designated Care Team:   [name], PA-C Roby Lofts, [place] . Sande Rives, PA-C  CALL IN June TO SCHEDULE APPOINTMENT IN [place]

## 2019-03-27 ENCOUNTER — Other Ambulatory Visit: Payer: Self-pay | Admitting: Cardiology

## 2019-03-29 NOTE — Telephone Encounter (Signed)
Bisoprolol 5 mg refilled.

## 2019-04-19 ENCOUNTER — Telehealth: Payer: Self-pay | Admitting: Cardiology

## 2019-04-19 NOTE — Telephone Encounter (Signed)
Pt states she no longer sees Dr. Shelia Media as her PCP. She uses the in-house Doctor at her [facility] facility

## 2019-04-19 NOTE — Telephone Encounter (Signed)
LMTCB... LM for pt to give [place] the name and their contact info for our records.

## 2019-04-23 NOTE — Telephone Encounter (Signed)
Follow up   Patient is returning call to provide the name of the person that she sees at [facility].  The NP is Billie Ruddy The MD is Merilynn Finland  Please call to discuss.

## 2019-04-23 NOTE — Telephone Encounter (Signed)
Chart updated with correct PCP.

## 2019-04-23 NOTE — Telephone Encounter (Signed)
Spoke with pt, she will find the information regarding the primary care physician at [facility] and call us back with that information.

## 2019-05-31 ENCOUNTER — Ambulatory Visit: Payer: Medicare Other | Admitting: Obstetrics and Gynecology

## 2019-07-22 ENCOUNTER — Ambulatory Visit: Payer: Medicare Other | Admitting: Cardiology

## 2019-07-30 ENCOUNTER — Other Ambulatory Visit: Payer: Self-pay

## 2019-08-02 NOTE — Progress Notes (Deleted)
85 y.o. G68P3 Widowed Caucasian female here for annual exam.    PCP:     Patient's last menstrual period was 11/19/1983.           Sexually active: {yes no:314532}  The current method of family planning is post menopausal status.    Exercising: {yes no:314532}  {types:19826} Smoker:  no  Health Maintenance: Pap:  07-21-15 Neg History of abnormal Pap:  Yes, 1996 hx of LEEP CIN I MMG:  07-21-18 3D Neg/density C/BiRads1 Colonoscopy: 04/2012 normal -- aged out BMD: ***08/2013  Result :Osteopenia with PCP TDaP:  2014 Gardasil:   n/a HIV:*** Hep C: *** Screening Labs:  Hb today: ***, Urine today: ***   reports that she quit smoking about 47 years ago. She has never used smokeless tobacco. She reports current alcohol use. She reports that she does not use drugs.  Past Medical History:  Diagnosis Date  . Adenomatous colon polyp   . Aortic stenosis, mild   . Depression    Tx'd 19 yrs ago when husband passed away  . Diverticulosis   . Heart murmur   . Hyperlipidemia   . Hypertension   . Hypothyroidism    Dr. Dwyane Dee follows pt.--had small nodules but had decreased in size and stopped Synthroid 06/2013  . Osteopenia   . Osteoporosis   . Pure hypercholesterolemia   . SVT (supraventricular tachycardia) (HCC)     Past Surgical History:  Procedure Laterality Date  . CERVICAL BIOPSY  W/ LOOP ELECTRODE EXCISION  1996   CIN I  . COLONOSCOPY  03/2001, 01/2007   2008 - no polyps  . DILATION AND CURETTAGE OF UTERUS    . TONSILLECTOMY      Current Outpatient Medications  Medication Sig Dispense Refill  . amoxicillin (AMOXIL) 500 MG capsule Take 2,000 mg by mouth daily. TAKE 4 CAPSULES PRIOR TO DENTAL PROCEDURES.    [name] BIOTIN PO Take 1 tablet by mouth daily.     . bisoprolol (ZEBETA) 5 MG tablet TAKE 1 TABLET ONCE DAILY. 90 tablet 3  . calcium-vitamin D (CALCIUM 500+D) 500-200 MG-UNIT per tablet Take 1 tablet by mouth daily with breakfast.    . Cholecalciferol (VITAMIN D3) 10000 UNITS  capsule Take 10,000 Units by mouth daily. Takes 1000 units daily (not 10,000 units)    . ibuprofen (ADVIL) 200 MG tablet Take 400 mg by mouth every 6 (six) hours as needed. For pain    . potassium chloride SA (K-DUR,KLOR-CON) 20 MEQ tablet TAKE 1 TABLET ONCE DAILY. 30 tablet 10  . triamterene-hydrochlorothiazide (MAXZIDE-25) 37.5-25 MG per tablet Take 0.5 tablets by mouth daily.     . valsartan (DIOVAN) 80 MG tablet Take 40 mg by mouth daily.     [name] zolpidem (AMBIEN) 10 MG tablet Take 5 mg by mouth at bedtime as needed. For insomnia     No current facility-administered medications for this visit.     Family History  Problem Relation Age of Onset  . Stroke Sister   . Breast cancer Sister   . Breast cancer Daughter 73       metastatic breast ca    Review of Systems  Exam:   LMP 11/19/1983     General appearance: alert, cooperative and appears stated age Head: normocephalic, without obvious abnormality, atraumatic Neck: no adenopathy, supple, symmetrical, trachea midline and thyroid normal to inspection and palpation Lungs: clear to auscultation bilaterally Breasts: normal appearance, no masses or tenderness, No nipple retraction or dimpling, No nipple discharge or bleeding,  No axillary adenopathy Heart: regular rate and rhythm Abdomen: soft, non-tender; no masses, no organomegaly Extremities: extremities normal, atraumatic, no cyanosis or edema Skin: skin color, texture, turgor normal. No rashes or lesions Lymph nodes: cervical, supraclavicular, and axillary nodes normal. Neurologic: grossly normal  Pelvic: External genitalia:  no lesions              No abnormal inguinal nodes palpated.              Urethra:  normal appearing urethra with no masses, tenderness or lesions              Bartholins and Skenes: normal                 Vagina: normal appearing vagina with normal color and discharge, no lesions              Cervix: no lesions              Pap taken: {yes  no:314532} Bimanual Exam:  Uterus:  normal size, contour, position, consistency, mobility, non-tender              Adnexa: no mass, fullness, tenderness              Rectal exam: {yes no:314532}.  Confirms.              Anus:  normal sphincter tone, no lesions  Chaperone was present for exam.  Assessment:   Well woman visit with normal exam.   Plan: Mammogram screening discussed. Self breast awareness reviewed. Pap and HR HPV as above. Guidelines for Calcium, Vitamin D, regular exercise program including cardiovascular and weight bearing exercise.   Follow up annually and prn.   Additional counseling given.  {yes [id number]. _______ minutes face to face time of which over 50% was spent in counseling.    After visit summary provided.

## 2019-08-03 ENCOUNTER — Ambulatory Visit: Payer: Medicare Other | Admitting: Obstetrics and Gynecology

## 2019-10-08 NOTE — Progress Notes (Deleted)
HPI: FU paroxysmal supraventricular tachycardia, AS,hypercholesterolemia, and benign hypertensive heart disease.Nuclear study December 2013 showed ejection fraction 73% and normal perfusion. Echocardiogram repeated January 2020 and showed vigorous LV function and moderate aortic stenosis with mean gradient 34 mmHg.  Since last seen,   Current Outpatient Medications  Medication Sig Dispense Refill  . amoxicillin (AMOXIL) 500 MG capsule Take 2,000 mg by mouth daily. TAKE 4 CAPSULES PRIOR TO DENTAL PROCEDURES.    [name] BIOTIN PO Take 1 tablet by mouth daily.     . bisoprolol (ZEBETA) 5 MG tablet TAKE 1 TABLET ONCE DAILY. 90 tablet 3  . calcium-vitamin D (CALCIUM 500+D) 500-200 MG-UNIT per tablet Take 1 tablet by mouth daily with breakfast.    . Cholecalciferol (VITAMIN D3) 10000 UNITS capsule Take 10,000 Units by mouth daily. Takes 1000 units daily (not 10,000 units)    . ibuprofen (ADVIL) 200 MG tablet Take 400 mg by mouth every 6 (six) hours as needed. For pain    . potassium chloride SA (K-DUR,KLOR-CON) 20 MEQ tablet TAKE 1 TABLET ONCE DAILY. 30 tablet 10  . triamterene-hydrochlorothiazide (MAXZIDE-25) 37.5-25 MG per tablet Take 0.5 tablets by mouth daily.     . valsartan (DIOVAN) 80 MG tablet Take 40 mg by mouth daily.     [name] zolpidem (AMBIEN) 10 MG tablet Take 5 mg by mouth at bedtime as needed. For insomnia     No current facility-administered medications for this visit.      Past Medical History:  Diagnosis Date  . Adenomatous colon polyp   . Aortic stenosis, mild   . Depression    Tx'd 19 yrs ago when husband passed away  . Diverticulosis   . Heart murmur   . Hyperlipidemia   . Hypertension   . Hypothyroidism    Dr. Dwyane Dee follows pt.--had small nodules but had decreased in size and stopped Synthroid 06/2013  . Osteopenia   . Osteoporosis   . Pure hypercholesterolemia   . SVT (supraventricular tachycardia) (HCC)     Past Surgical History:  Procedure Laterality Date  .  CERVICAL BIOPSY  W/ LOOP ELECTRODE EXCISION  1996   CIN I  . COLONOSCOPY  03/2001, 01/2007   2008 - no polyps  . DILATION AND CURETTAGE OF UTERUS    . TONSILLECTOMY      Social History   Socioeconomic History  . Marital status: Widowed    Spouse name: Not on file  . Number of children: Not on file  . Years of education: Not on file  . Highest education level: Not on file  Occupational History  . Not on file  Social Needs  . Financial resource strain: Not on file  . Food insecurity    Worry: Not on file    Inability: Not on file  . Transportation needs    Medical: Not on file    Non-medical: Not on file  Tobacco Use  . Smoking status: Former Smoker    Quit date: 06/17/1972    Years since quitting: 47.3  . Smokeless tobacco: Never Used  . Tobacco comment: quit June 17, 1972  Substance and Sexual Activity  . Alcohol use: Yes    Alcohol/week: 0.0 standard drinks    Comment: 2 drinks per month  . Drug use: No  . Sexual activity: Never    Partners: Male    Birth control/protection: Post-menopausal  Lifestyle  . Physical activity    Days per week: Not on file    Minutes  per session: Not on file  . Stress: Not on file  Relationships  . Social Herbalist on phone: Not on file    Gets together: Not on file    Attends religious service: Not on file    Active member of club or organization: Not on file    Attends meetings of clubs or organizations: Not on file    Relationship status: Not on file  . Intimate partner violence    Fear of current or ex partner: Not on file    Emotionally abused: Not on file    Physically abused: Not on file    Forced sexual activity: Not on file  Other Topics Concern  . Not on file  Social History Narrative  . Not on file    Family History  Problem Relation Age of Onset  . Stroke Sister   . Breast cancer Sister   . Breast cancer Daughter 60       metastatic breast ca    ROS: no fevers or chills, productive cough,  hemoptysis, dysphasia, odynophagia, melena, hematochezia, dysuria, hematuria, rash, seizure activity, orthopnea, PND, pedal edema, claudication. Remaining systems are negative.  Physical Exam: Well-developed well-nourished in no acute distress.  Skin is warm and dry.  HEENT is normal.  Neck is supple.  Chest is clear to auscultation with normal expansion.  Cardiovascular exam is regular rate and rhythm.  Abdominal exam nontender or distended. No masses palpated. Extremities show no edema. neuro grossly intact  ECG- personally reviewed  A/P  1 aortic stenosis-patient denies dyspnea, chest pain or syncope.  Plan repeat echocardiogram January 2021.  I have explained that she may require TAVR in the future.  2 hypertension-blood pressure controlled.  Continue present medical regimen.  3 history of SVT-no recurrent episodes.  Continue beta-blocker.  Can consider referral to electrophysiology for ablation in the future if she has more frequent episodes.  4 hyperlipidemia-managed by primary care.  Kirk Ruths, MD

## 2019-10-19 ENCOUNTER — Ambulatory Visit: Payer: Medicare Other | Admitting: Cardiology

## 2019-12-13 NOTE — Progress Notes (Deleted)
HPI: FU paroxysmal supraventricular tachycardia, AS,hypercholesterolemia, and benign hypertensive heart disease.Nuclear study December 2013 showed ejection fraction 73% and normal perfusion. Echocardiogram repeated January 2020 and showed vigorous LV function and moderate aortic stenosis with mean gradient 34 mmHg.  Since last seen,   Current Outpatient Medications  Medication Sig Dispense Refill  . amoxicillin (AMOXIL) 500 MG capsule Take 2,000 mg by mouth daily. TAKE 4 CAPSULES PRIOR TO DENTAL PROCEDURES.    [name] BIOTIN PO Take 1 tablet by mouth daily.     . bisoprolol (ZEBETA) 5 MG tablet TAKE 1 TABLET ONCE DAILY. 90 tablet 3  . calcium-vitamin D (CALCIUM 500+D) 500-200 MG-UNIT per tablet Take 1 tablet by mouth daily with breakfast.    . Cholecalciferol (VITAMIN D3) 10000 UNITS capsule Take 10,000 Units by mouth daily. Takes 1000 units daily (not 10,000 units)    . ibuprofen (ADVIL) 200 MG tablet Take 400 mg by mouth every 6 (six) hours as needed. For pain    . potassium chloride SA (K-DUR,KLOR-CON) 20 MEQ tablet TAKE 1 TABLET ONCE DAILY. 30 tablet 10  . triamterene-hydrochlorothiazide (MAXZIDE-25) 37.5-25 MG per tablet Take 0.5 tablets by mouth daily.     . valsartan (DIOVAN) 80 MG tablet Take 40 mg by mouth daily.     [name] zolpidem (AMBIEN) 10 MG tablet Take 5 mg by mouth at bedtime as needed. For insomnia     No current facility-administered medications for this visit.     Past Medical History:  Diagnosis Date  . Adenomatous colon polyp   . Aortic stenosis, mild   . Depression    Tx'd 19 yrs ago when husband passed away  . Diverticulosis   . Heart murmur   . Hyperlipidemia   . Hypertension   . Hypothyroidism    Dr. Dwyane Dee follows pt.--had small nodules but had decreased in size and stopped Synthroid 06/2013  . Osteopenia   . Osteoporosis   . Pure hypercholesterolemia   . SVT (supraventricular tachycardia) (HCC)     Past Surgical History:  Procedure Laterality Date  .  CERVICAL BIOPSY  W/ LOOP ELECTRODE EXCISION  1996   CIN I  . COLONOSCOPY  03/2001, 01/2007   2008 - no polyps  . DILATION AND CURETTAGE OF UTERUS    . TONSILLECTOMY      Social History   Socioeconomic History  . Marital status: Widowed    Spouse name: Not on file  . Number of children: Not on file  . Years of education: Not on file  . Highest education level: Not on file  Occupational History  . Not on file  Tobacco Use  . Smoking status: Former Smoker    Quit date: 06/17/1972    Years since quitting: 47.5  . Smokeless tobacco: Never Used  . Tobacco comment: quit June 17, 1972  Substance and Sexual Activity  . Alcohol use: Yes    Alcohol/week: 0.0 standard drinks    Comment: 2 drinks per month  . Drug use: No  . Sexual activity: Never    Partners: Male    Birth control/protection: Post-menopausal  Other Topics Concern  . Not on file  Social History Narrative  . Not on file   Social Determinants of Health   Financial Resource Strain:   . Difficulty of Paying Living Expenses: Not on file  Food Insecurity:   . Worried About Charity fundraiser in the Last Year: Not on file  . Ran Out of Food in the  Last Year: Not on file  Transportation Needs:   . Lack of Transportation (Medical): Not on file  . Lack of Transportation (Non-Medical): Not on file  Physical Activity:   . Days of Exercise per Week: Not on file  . Minutes of Exercise per Session: Not on file  Stress:   . Feeling of Stress : Not on file  Social Connections:   . Frequency of Communication with Friends and Family: Not on file  . Frequency of Social Gatherings with Friends and Family: Not on file  . Attends Religious Services: Not on file  . Active Member of Clubs or Organizations: Not on file  . Attends Archivist Meetings: Not on file  . Marital Status: Not on file  Intimate Partner Violence:   . Fear of Current or Ex-Partner: Not on file  . Emotionally Abused: Not on file  . Physically  Abused: Not on file  . Sexually Abused: Not on file    Family History  Problem Relation Age of Onset  . Stroke Sister   . Breast cancer Sister   . Breast cancer Daughter 36       metastatic breast ca    ROS: no fevers or chills, productive cough, hemoptysis, dysphasia, odynophagia, melena, hematochezia, dysuria, hematuria, rash, seizure activity, orthopnea, PND, pedal edema, claudication. Remaining systems are negative.  Physical Exam: Well-developed well-nourished in no acute distress.  Skin is warm and dry.  HEENT is normal.  Neck is supple.  Chest is clear to auscultation with normal expansion.  Cardiovascular exam is regular rate and rhythm.  Abdominal exam nontender or distended. No masses palpated. Extremities show no edema. neuro grossly intact  ECG- personally reviewed  A/P  1 aortic stenosis-patient continues to do well with no dyspnea, chest pain or syncope.  We will arrange follow-up echocardiogram.  She understands she will likely require TAVR in the future.  2 history of supraventricular tachycardia-no recent episodes.  Continue beta-blocker.  We will consider electrophysiology evaluation for ablation if she has more frequent episodes in the future.  3 hypertension-blood pressure controlled.  Continue present medical regimen.  4 hyperlipidemia- follow-up with primary care.  Kirk Ruths, MD

## 2019-12-16 ENCOUNTER — Ambulatory Visit: Payer: Medicare Other | Admitting: Cardiology

## 2019-12-21 ENCOUNTER — Ambulatory Visit: Payer: Medicare PPO | Admitting: Cardiology

## 2019-12-21 ENCOUNTER — Encounter: Payer: Self-pay | Admitting: Cardiology

## 2019-12-21 ENCOUNTER — Other Ambulatory Visit: Payer: Self-pay

## 2019-12-21 VITALS — BP 136/60 | HR 60 | Temp 96.8°F | Ht 65.0 in | Wt 166.0 lb

## 2019-12-21 DIAGNOSIS — E7800 Pure hypercholesterolemia, unspecified: Secondary | ICD-10-CM | POA: Diagnosis not present

## 2019-12-21 DIAGNOSIS — I471 Supraventricular tachycardia: Secondary | ICD-10-CM | POA: Diagnosis not present

## 2019-12-21 DIAGNOSIS — I350 Nonrheumatic aortic (valve) stenosis: Secondary | ICD-10-CM | POA: Diagnosis not present

## 2019-12-21 DIAGNOSIS — I10 Essential (primary) hypertension: Secondary | ICD-10-CM

## 2019-12-21 NOTE — Progress Notes (Signed)
HPI: FU paroxysmal supraventricular tachycardia, AS,hypercholesterolemia, and benign hypertensive heart disease.Nuclear study December 2013 showed ejection fraction 73% and normal perfusion. Echocardiogram repeated January 2020 and showed vigorous LV function and moderate aortic stenosis with mean gradient 34 mmHg.  Since last seen,  she denies increased dyspnea, chest pain or syncope.  Current Outpatient Medications  Medication Sig Dispense Refill  . amoxicillin (AMOXIL) 500 MG capsule Take 2,000 mg by mouth daily. TAKE 4 CAPSULES PRIOR TO DENTAL PROCEDURES.    [name] BIOTIN PO Take 1 tablet by mouth daily.     . bisoprolol (ZEBETA) 5 MG tablet TAKE 1 TABLET ONCE DAILY. 90 tablet 3  . calcium-vitamin D (CALCIUM 500+D) 500-200 MG-UNIT per tablet Take 1 tablet by mouth daily with breakfast.    . Cholecalciferol (VITAMIN D3) 10000 UNITS capsule Take 10,000 Units by mouth daily. Takes 1000 units daily (not 10,000 units)    . ibuprofen (ADVIL) 200 MG tablet Take 400 mg by mouth every 6 (six) hours as needed. For pain    . potassium chloride SA (K-DUR,KLOR-CON) 20 MEQ tablet TAKE 1 TABLET ONCE DAILY. 30 tablet 10  . triamterene-hydrochlorothiazide (MAXZIDE-25) 37.5-25 MG per tablet Take 0.5 tablets by mouth daily.     . valsartan (DIOVAN) 80 MG tablet Take 40 mg by mouth daily.     [name] zolpidem (AMBIEN) 10 MG tablet Take 5 mg by mouth at bedtime as needed. For insomnia     No current facility-administered medications for this visit.     Past Medical History:  Diagnosis Date  . Adenomatous colon polyp   . Aortic stenosis, mild   . Depression    Tx'd 19 yrs ago when husband passed away  . Diverticulosis   . Heart murmur   . Hyperlipidemia   . Hypertension   . Hypothyroidism    Dr. Dwyane Dee follows pt.--had small nodules but had decreased in size and stopped Synthroid 06/2013  . Osteopenia   . Osteoporosis   . Pure hypercholesterolemia   . SVT (supraventricular tachycardia) (HCC)      Past Surgical History:  Procedure Laterality Date  . CERVICAL BIOPSY  W/ LOOP ELECTRODE EXCISION  1996   CIN I  . COLONOSCOPY  03/2001, 01/2007   2008 - no polyps  . DILATION AND CURETTAGE OF UTERUS    . TONSILLECTOMY      Social History   Socioeconomic History  . Marital status: Widowed    Spouse name: Not on file  . Number of children: Not on file  . Years of education: Not on file  . Highest education level: Not on file  Occupational History  . Not on file  Tobacco Use  . Smoking status: Former Smoker    Quit date: 06/17/1972    Years since quitting: 47.5  . Smokeless tobacco: Never Used  . Tobacco comment: quit June 17, 1972  Substance and Sexual Activity  . Alcohol use: Yes    Alcohol/week: 0.0 standard drinks    Comment: 2 drinks per month  . Drug use: No  . Sexual activity: Never    Partners: Male    Birth control/protection: Post-menopausal  Other Topics Concern  . Not on file  Social History Narrative  . Not on file   Social Determinants of Health   Financial Resource Strain:   . Difficulty of Paying Living Expenses: Not on file  Food Insecurity:   . Worried About Charity fundraiser in the Last Year: Not on file  .  Ran Out of Food in the Last Year: Not on file  Transportation Needs:   . Lack of Transportation (Medical): Not on file  . Lack of Transportation (Non-Medical): Not on file  Physical Activity:   . Days of Exercise per Week: Not on file  . Minutes of Exercise per Session: Not on file  Stress:   . Feeling of Stress : Not on file  Social Connections:   . Frequency of Communication with Friends and Family: Not on file  . Frequency of Social Gatherings with Friends and Family: Not on file  . Attends Religious Services: Not on file  . Active Member of Clubs or Organizations: Not on file  . Attends Archivist Meetings: Not on file  . Marital Status: Not on file  Intimate Partner Violence:   . Fear of Current or Ex-Partner: Not  on file  . Emotionally Abused: Not on file  . Physically Abused: Not on file  . Sexually Abused: Not on file    Family History  Problem Relation Age of Onset  . Stroke Sister   . Breast cancer Sister   . Breast cancer Daughter 95       metastatic breast ca    ROS: no fevers or chills, productive cough, hemoptysis, dysphasia, odynophagia, melena, hematochezia, dysuria, hematuria, rash, seizure activity, orthopnea, PND, pedal edema, claudication. Remaining systems are negative.  Physical Exam: Well-developed well-nourished in no acute distress.  Skin is warm and dry.  HEENT is normal.  Neck is supple.  Chest is clear to auscultation with normal expansion.  Cardiovascular exam is regular rate and rhythm.  3/6 systolic murmur left sternal border.  S2 does not appear to be diminished. Abdominal exam nontender or distended. No masses palpated. Extremities show no edema. neuro grossly intact  ECG-sinus rhythm with first-degree AV block, left axis deviation, left ventricular hypertrophy.  Personally reviewed  A/P  1 aortic stenosis-patient continues to do well with no dyspnea, chest pain or syncope.  We will arrange follow-up echocardiogram.  She understands she will likely require TAVR in the future.  2 history of supraventricular tachycardia-no recent episodes.  Continue beta-blocker.  We will consider electrophysiology evaluation for ablation if she has more frequent episodes in the future.  3 hypertension-blood pressure controlled.  Continue present medical regimen.  4 hyperlipidemia- follow-up with primary care.  Kirk Ruths, MD

## 2019-12-21 NOTE — Patient Instructions (Signed)
Medication Instructions:  NO CHANGE *If you need a refill on your cardiac medications before your next appointment, please call your pharmacy*  Lab Work: If you have labs (blood work) drawn today and your tests are completely normal, you will receive your results only by: [name] MyChart Message (if you have MyChart) OR . A paper copy in the mail If you have any lab test that is abnormal or we need to change your treatment, we will call you to review the results.  Testing/Procedures: Your physician has requested that you have an echocardiogram. Echocardiography is a painless test that uses sound waves to create images of your heart. It provides your doctor with information about the size and shape of your heart and how well your heart's chambers and valves are working. This procedure takes approximately one hour. There are no restrictions for this procedure.[place]    Follow-Up: At [facility], you and your health needs are our priority.  As part of our continuing mission to provide you with exceptional heart care, we have created designated Provider Care Teams.  These Care Teams include your primary Cardiologist (physician) and Advanced Practice Providers (APPs -  Physician Assistants and Nurse Practitioners) who all work together to provide you with the care you need, when you need it.  Your next appointment:   6 month(s)  The format for your next appointment:   Either In Person or Virtual  Provider:   You may see Kirk Ruths MD or one of the following Advanced Practice Providers on your designated Care Team:    [name], PA-C  [place], [place]  Coletta Memos, [place]

## 2019-12-30 ENCOUNTER — Ambulatory Visit (HOSPITAL_COMMUNITY): Payer: Medicare PPO | Attending: Cardiology

## 2019-12-30 ENCOUNTER — Other Ambulatory Visit: Payer: Self-pay

## 2019-12-30 DIAGNOSIS — I350 Nonrheumatic aortic (valve) stenosis: Secondary | ICD-10-CM | POA: Insufficient documentation

## 2020-02-07 ENCOUNTER — Emergency Department (HOSPITAL_COMMUNITY): Payer: Medicare PPO

## 2020-02-07 ENCOUNTER — Other Ambulatory Visit: Payer: Self-pay

## 2020-02-07 ENCOUNTER — Emergency Department (HOSPITAL_COMMUNITY)
Admission: EM | Admit: 2020-02-07 | Discharge: 2020-02-08 | Disposition: A | Discharge status: Home / Self Care | Payer: Medicare PPO | Attending: Emergency Medicine | Admitting: Emergency Medicine

## 2020-02-07 ENCOUNTER — Encounter (HOSPITAL_COMMUNITY): Payer: Self-pay

## 2020-02-07 DIAGNOSIS — I10 Essential (primary) hypertension: Secondary | ICD-10-CM | POA: Insufficient documentation

## 2020-02-07 DIAGNOSIS — R0789 Other chest pain: Secondary | ICD-10-CM | POA: Insufficient documentation

## 2020-02-07 DIAGNOSIS — R0602 Shortness of breath: Secondary | ICD-10-CM | POA: Insufficient documentation

## 2020-02-07 DIAGNOSIS — E039 Hypothyroidism, unspecified: Secondary | ICD-10-CM | POA: Diagnosis not present

## 2020-02-07 DIAGNOSIS — Z87891 Personal history of nicotine dependence: Secondary | ICD-10-CM | POA: Insufficient documentation

## 2020-02-07 DIAGNOSIS — Z79899 Other long term (current) drug therapy: Secondary | ICD-10-CM | POA: Diagnosis not present

## 2020-02-07 DIAGNOSIS — R079 Chest pain, unspecified: Secondary | ICD-10-CM

## 2020-02-07 LAB — TROPONIN I (HIGH SENSITIVITY): Troponin I (High Sensitivity): 14 ng/L (ref <18)

## 2020-02-07 LAB — CBC
HCT: 36 % (ref 36.0–46.0)
Hemoglobin: 11.4 g/dL — ABNORMAL LOW (ref 12.0–15.0)
MCH: 29.4 pg (ref 26.0–34.0)
MCHC: 31.7 g/dL (ref 30.0–36.0)
MCV: 92.8 fL (ref 80.0–100.0)
Platelets: 232 10*3/uL (ref 150–400)
RBC: 3.88 MIL/uL (ref 3.87–5.11)
RDW: 14.1 % (ref 11.5–15.5)
WBC: 6.6 10*3/uL (ref 4.0–10.5)
nRBC: 0 % (ref 0.0–0.2)

## 2020-02-07 LAB — COMPREHENSIVE METABOLIC PANEL
ALT: 14 U/L (ref 0–44)
AST: 21 U/L (ref 15–41)
Albumin: 3.3 g/dL — ABNORMAL LOW (ref 3.5–5.0)
Alkaline Phosphatase: 54 U/L (ref 38–126)
Anion gap: 10 (ref 5–15)
BUN: 21 mg/dL (ref 8–23)
CO2: 20 mmol/L — ABNORMAL LOW (ref 22–32)
Calcium: 8.2 mg/dL — ABNORMAL LOW (ref 8.9–10.3)
Chloride: 110 mmol/L (ref 98–111)
Creatinine, Ser: 0.83 mg/dL (ref 0.44–1.00)
GFR calc Af Amer: >60 mL/min (ref >60)
GFR calc non Af Amer: >60 mL/min (ref >60)
Glucose, Bld: 109 mg/dL — ABNORMAL HIGH (ref 70–99)
Potassium: 4 mmol/L (ref 3.5–5.1)
Sodium: 140 mmol/L (ref 135–145)
Total Bilirubin: 0.7 mg/dL (ref 0.3–1.2)
Total Protein: 5.5 g/dL — ABNORMAL LOW (ref 6.5–8.1)

## 2020-02-07 NOTE — ED Provider Notes (Signed)
[name]   CSN: [id number] Arrival date & time: 02/07/20  1954     History Chief Complaint  Patient presents with  . Chest Pain    Kim Ward is a 86 y.o. female.  HPI Patient is an 86 year old female with a PMH of hyperlipidemia, hypothyroidism, hypertension and aortic stenosis presenting to the ED today due to chest pain.  Patient reports that she had gone to dinner with her friends and on her way back home, she developed chest pressure under her left breast.  She experienced mild associated shortness of breath.  The pressure was nonradiating, nonexertional and nonpleuritic.  Her friends called EMS who provided 325 mg aspirin and transported her to the ED for evaluation.  Since that time, patient says that her chest pressure has improved.  She denies any associated nausea, vomiting, diaphoresis, fever, chills, diarrhea, cough or congestion.  Patient does not smoke.  She reports that her only heart problems are aortic stenosis for which she sees Dr. Stanford Breed.    Past Medical History:  Diagnosis Date  . Adenomatous colon polyp   . Aortic stenosis, mild   . Depression    Tx'd 19 yrs ago when husband passed away  . Diverticulosis   . Heart murmur   . Hyperlipidemia   . Hypertension   . Hypothyroidism    Dr. Dwyane Dee follows pt.--had small nodules but had decreased in size and stopped Synthroid 06/2013  . Osteopenia   . Osteoporosis   . Pure hypercholesterolemia   . SVT (supraventricular tachycardia) [facility])     Patient Active Problem List   Diagnosis Date Noted  . Hypertensive cardiovascular disease 06/26/2016  . Dizziness 06/26/2016  . Low back pain 07/26/2015  . Constipation 03/01/2014  . Alopecia 09/29/2013  . Multinodular goiter 07/22/2013  . Benign hypertensive heart disease without heart failure 03/08/2013  . Elevated troponin 10/10/2012  . SVT (supraventricular tachycardia) ([place]) 12/31/2011  . Hypercholesteremia  02/11/2011  . Hypothyroid 02/11/2011  . Aortic stenosis, mild 02/11/2011    Past Surgical History:  Procedure Laterality Date  . CERVICAL BIOPSY  W/ LOOP ELECTRODE EXCISION  1996   CIN I  . COLONOSCOPY  03/2001, 01/2007   2008 - no polyps  . DILATION AND CURETTAGE OF UTERUS    . TONSILLECTOMY       OB History    Gravida  5   Para  3   Term      Preterm      AB      Living  3     SAB      TAB      Ectopic      Multiple      Live Births              Family History  Problem Relation Age of Onset  . Stroke Sister   . Breast cancer Sister   . Breast cancer Daughter 28       metastatic breast ca    Social History   Tobacco Use  . Smoking status: Former Smoker    Quit date: 06/17/1972    Years since quitting: 47.6  . Smokeless tobacco: Never Used  . Tobacco comment: quit June 17, 1972  Substance Use Topics  . Alcohol use: Yes    Alcohol/week: 0.0 standard drinks    Comment: 2 drinks per month  . Drug use: No    Home Medications Prior to Admission medications  Medication Sig Start Date End Date Taking? Authorizing Provider  amoxicillin (AMOXIL) 500 MG capsule Take 2,000 mg by mouth daily. TAKE 4 CAPSULES PRIOR TO DENTAL PROCEDURES.    [provider]  BIOTIN PO Take 1 tablet by mouth daily.     [provider]  bisoprolol (ZEBETA) 5 MG tablet TAKE 1 TABLET ONCE DAILY. 03/29/19   Lelon Perla, MD  calcium-vitamin D (CALCIUM 500+D) 500-200 MG-UNIT per tablet Take 1 tablet by mouth daily with breakfast.    [provider]  Cholecalciferol (VITAMIN D3) 10000 UNITS capsule Take 10,000 Units by mouth daily. Takes 1000 units daily (not 10,000 units)    [provider]  ibuprofen (ADVIL) 200 MG tablet Take 400 mg by mouth every 6 (six) hours as needed. For pain    [provider]  potassium chloride SA (K-DUR,KLOR-CON) 20 MEQ tablet TAKE 1 TABLET ONCE DAILY. 12/09/16   Lelon Perla, MD   triamterene-hydrochlorothiazide (MAXZIDE-25) 37.5-25 MG per tablet Take 0.5 tablets by mouth daily.     [provider]  valsartan (DIOVAN) 80 MG tablet Take 40 mg by mouth daily.     [provider]  zolpidem (AMBIEN) 10 MG tablet Take 5 mg by mouth at bedtime as needed. For insomnia    [provider]    Allergies    Augmentin [amoxicillin-pot clavulanate], Azithromycin, Bextra [valdecoxib], Cefuroxime, Cefuroxime axetil, Humibid la [guaifenesin], Lipitor [atorvastatin calcium], Ramipril, Risedronate sodium, Tramadol, Zocor [simvastatin], and Niaspan [niacin er]  Review of Systems   Review of Systems  Constitutional: Negative for chills and fever.  HENT: Negative for ear pain and sore throat.   Eyes: Negative for pain and visual disturbance.  Respiratory: Positive for shortness of breath. Negative for cough.   Cardiovascular: Positive for chest pain. Negative for palpitations and leg swelling.  Gastrointestinal: Negative for abdominal pain, diarrhea, nausea and vomiting.  Genitourinary: Negative for dysuria and hematuria.  Musculoskeletal: Negative for arthralgias, back pain and neck pain.  Skin: Negative for color change and rash.  Neurological: Negative for seizures, syncope, weakness, light-headedness and headaches.  Psychiatric/Behavioral: Negative for agitation.  All other systems reviewed and are negative.   Physical Exam Updated Vital Signs BP 113/65 (BP Location: Right Arm)   Pulse 84   Temp 98 F (36.7 C) (Oral)   Resp 17   Ht 5\' 4"  (1.626 m)   Wt 72.6 kg   LMP 11/19/1983   SpO2 97%   BMI 27.46 kg/m   Physical Exam Vitals and nursing note reviewed.  Constitutional:      General: She is not in acute distress.    Appearance: Normal appearance. She is well-developed and normal weight. She is not ill-appearing or diaphoretic.  HENT:     Head: Normocephalic and atraumatic.     Right Ear: External ear normal.     Left Ear: External ear  normal.     Nose: Nose normal. No congestion or rhinorrhea.     Mouth/Throat:     Mouth: Mucous membranes are moist.     Pharynx: Oropharynx is clear.  Eyes:     Extraocular Movements: Extraocular movements intact.     Pupils: Pupils are equal, round, and reactive to light.  Cardiovascular:     Rate and Rhythm: Normal rate and regular rhythm.     Pulses: Normal pulses.     Heart sounds: Normal heart sounds. No murmur.  Pulmonary:     Effort: Pulmonary effort is normal. No respiratory distress.  Breath sounds: Normal breath sounds. No stridor. No wheezing, rhonchi or rales.  Chest:     Chest wall: No tenderness.  Abdominal:     General: There is no distension.     Palpations: Abdomen is soft.     Tenderness: There is no abdominal tenderness.  Musculoskeletal:        General: Normal range of motion.     Cervical back: Normal range of motion and neck supple. No rigidity.     Right lower leg: No edema.     Left lower leg: No edema.  Skin:    General: Skin is warm and dry.     Capillary Refill: Capillary refill takes less than 2 seconds.  Neurological:     General: No focal deficit present.     Mental Status: She is alert and oriented to person, place, and time. Mental status is at baseline.  Psychiatric:        Mood and Affect: Mood normal.     ED Results / Procedures / Treatments   Labs (all labs ordered are listed, but only abnormal results are displayed) Labs Reviewed  CBC - Abnormal; Notable for the following components:      Result Value   Hemoglobin 11.4 (*)    All other components within normal limits  COMPREHENSIVE METABOLIC PANEL - Abnormal; Notable for the following components:   CO2 20 (*)    Glucose, Bld 109 (*)    Calcium 8.2 (*)    Total Protein 5.5 (*)    Albumin 3.3 (*)    All other components within normal limits  TROPONIN I (HIGH SENSITIVITY)  TROPONIN I (HIGH SENSITIVITY)    EKG EKG Interpretation  Date/Time:  Monday February 07 2020 19:56:13  EDT Ventricular Rate:  85 PR Interval:    QRS Duration: 89 QT Interval:  358 QTC Calculation: 426 R Axis:   24 Text Interpretation: Sinus rhythm Prolonged PR interval Nonspecific T abnormalities, lateral leads Confirmed by Lennice Sites [phone]) on 02/07/2020 8:18:20 PM   Radiology DG Chest Portable 1 View  Result Date: 02/07/2020 CLINICAL DATA:  Central chest pressure x1 day. EXAM: PORTABLE CHEST 1 VIEW COMPARISON:  October 20, 2012 FINDINGS: The heart size and mediastinal contours are within normal limits. Both lungs are clear. There is stable scoliosis of the thoracic spine with multilevel degenerative changes. IMPRESSION: No active disease. Electronically Signed   By: Virgina Norfolk M.D.   On: 02/07/2020 21:03    Procedures Procedures (including critical care time)  Medications Ordered in ED Medications - No data to display  ED Course  I have reviewed the triage vital signs and the nursing notes.  Pertinent labs & imaging results that were available during my care of the patient were reviewed by me and considered in my medical decision making (see chart for details).    MDM Rules/Calculators/A&P                     Patient is an 86 year old female with a PMH of hyperlipidemia, hypothyroidism, hypertension and aortic stenosis presenting to the ED today due to chest pain.  Physical exam unremarkable.  Vital signs stable.  Afebrile.  On arrival, patient appears generally well and is displaying no signs of acute distress.  Her biggest concern is chest pressure under her left breast that has mostly resolved.  She has received 325 mg aspirin with EMS.  EKG shows normal sinus rhythm with no signs of acute ischemia.  Chest x-ray shows  no cardiac or pulmonary abnormalities.  CBC and CMP unremarkable.  Calcium 8.2, total protein 5.5, albumin 3.3.  Serial troponins are 14 and 16, consecutively.  Based off of work-up, low suspicion for ACS, especially as her chest pressure seems atypical.   Chest x-ray not concerning for pneumonia or pneumothorax.  Presentation not concerning for aortic dissection or PE.  Patient's pressure may be related to reflux; however, patient denies any previous history of such. HEAR 3.  Patient has had no return of previous symptoms.  At this time, we feel as though patient is stable for discharge with close outpatient follow-up with her cardiologist.  Patient says that she will call to schedule this appointment tomorrow.  No further work-up or intervention provided while in the ED.  Patient to be discharged back to living facility at this time.  She agrees with plan and expresses understanding.  Provided strict return precautions.  Patient stable at time of discharge.  Patient assessed and evaluated with Dr. Ronnald Nian.  Nadeen Landau, MD   Final Clinical Impression(s) / ED Diagnoses Final diagnoses:  Chest pain, unspecified type    Rx / DC Orders ED Discharge Orders    None       Nadeen Landau, MD 02/08/20 0931    Lennice Sites, DO 02/08/20 1223

## 2020-02-07 NOTE — ED Triage Notes (Signed)
Patient from [facility] comes in [place] for chest tightness and SOB. 324 aspirin EMS gave. VS 140/80, HR 86, 97% RA, CBG 186

## 2020-02-07 NOTE — ED Provider Notes (Signed)
I have personally seen and examined the patient. I have reviewed the documentation on PMH/FH/Soc Hx. I have discussed the plan of care with the resident and patient.  I have reviewed and agree with the resident's documentation. Please see associated encounter note.  Briefly, the patient is a 86 y.o. female here with hypertension, SVT, high cholesterol who presents to the ED after episode of chest pain.  Patient with unremarkable vitals.  No fever.  Patient states left-sided pain in her rib below her breast for a few minutes after eating dinner.  No diaphoresis, no radiation of pain.  Overall atypical story for ACS.  EKG shows sinus rhythm.  Troponin negative x2.  Patient has not had any other episodes of chest pain before today or after that event.  Possibly could be due to reflux.  Patient with no significant anemia, electrolyte abnormality, kidney injury.  This x-ray without signs of infection.  Heart score was 3 and overall believe low risk for ACS at this time.  We will have her follow-up with her cardiologist.  Patient with no PE risk factors.  No shortness of breath, no hypoxia and doubt PE.  Overall asymptomatic under my care and discharged in good condition.  Understands return precautions and follow-up plan.  This chart was dictated using voice recognition software.  Despite best efforts to proofread,  errors can occur which can change the documentation meaning.     EKG Interpretation  Date/Time:  Monday February 07 2020 19:56:13 EDT Ventricular Rate:  85 PR Interval:    QRS Duration: 89 QT Interval:  358 QTC Calculation: 426 R Axis:   24 Text Interpretation: Sinus rhythm Prolonged PR interval Nonspecific T abnormalities, lateral leads Confirmed by Lennice Sites [phone]) on 02/07/2020 8:18:20 PM         Lennice Sites, DO 02/08/20 [id number]

## 2020-02-08 LAB — TROPONIN I (HIGH SENSITIVITY): Troponin I (High Sensitivity): 16 ng/L (ref <18)

## 2020-02-23 ENCOUNTER — Encounter: Payer: Self-pay | Admitting: Physician Assistant

## 2020-02-23 ENCOUNTER — Other Ambulatory Visit: Payer: Self-pay

## 2020-02-23 ENCOUNTER — Ambulatory Visit: Payer: Medicare PPO | Admitting: Physician Assistant

## 2020-02-23 VITALS — BP 132/62 | HR 60 | Temp 96.8°F | Ht 66.0 in | Wt 163.4 lb

## 2020-02-23 DIAGNOSIS — R072 Precordial pain: Secondary | ICD-10-CM | POA: Diagnosis not present

## 2020-02-23 DIAGNOSIS — I350 Nonrheumatic aortic (valve) stenosis: Secondary | ICD-10-CM

## 2020-02-23 DIAGNOSIS — Z01810 Encounter for preprocedural cardiovascular examination: Secondary | ICD-10-CM

## 2020-02-23 DIAGNOSIS — I10 Essential (primary) hypertension: Secondary | ICD-10-CM | POA: Diagnosis not present

## 2020-02-23 DIAGNOSIS — E785 Hyperlipidemia, unspecified: Secondary | ICD-10-CM

## 2020-02-23 NOTE — Patient Instructions (Addendum)
Medication Instructions:   DO NOT take your Losartan on the morning of the Coronary CT  TAKE 7.5 mg (1.5 tablets) of Bisoprolol (Zebeta) the morning of the Coronary CT Your physician recommends that you continue on your current medications as directed. Please refer to the Current Medication list given to you today.  *If you need a refill on your cardiac medications before your next appointment, please call your pharmacy*  Lab Work: Your physician recommends that you return for lab work 1 month prior to Coronary CT:  BMET  If you have labs (blood work) drawn today and your tests are completely normal, you will receive your results only by: [name] MyChart Message (if you have MyChart) OR . A paper copy in the mail If you have any lab test that is abnormal or we need to change your treatment, we will call you to review the results.  Testing/Procedures: Your physician has requested that you have cardiac CT.   Follow-Up: At [facility], you and your health needs are our priority.  As part of our continuing mission to provide you with exceptional heart care, we have created designated Provider Care Teams.  These Care Teams include your primary Cardiologist (physician) and Advanced Practice Providers (APPs -  Physician Assistants and Nurse Practitioners) who all work together to provide you with the care you need, when you need it.  We recommend signing up for the patient portal called "MyChart".  Sign up information is provided on this After Visit Summary.  MyChart is used to connect with patients for Virtual Visits (Telemedicine).  Patients are able to view lab/test results, encounter notes, upcoming appointments, etc.  Non-urgent messages can be sent to your provider as well.   To learn more about what you can do with MyChart, go to [email or web address].    Your next appointment:   4 month(s)  The format for your next appointment:   In Person  Provider:   Kirk Ruths, MD  Other  Instructions  Your cardiac CT will be scheduled at one of the below locations:   [facility] [address] [place], [place] [address] [phone]  [place] [address] [place], [place] [address] [phone]  If scheduled at [facility], please arrive at the [facility] main entrance of [facility] 30 minutes prior to test start time. Proceed to the [facility] Radiology Department (first floor) to check-in and test prep.  If scheduled at [facility], please arrive 15 mins early for check-in and test prep.  Please follow these instructions carefully (unless otherwise directed):    On the Night Before the Test: . Be sure to Drink plenty of water. . Do not consume any caffeinated/decaffeinated beverages or chocolate 12 hours prior to your test. . Do not take any antihistamines 12 hours prior to your test. . If you take Metformin do not take 24 hours prior to test. . If the patient has contrast allergy: ? Patient will need a prescription for Prednisone and very clear instructions (as follows): 1. Prednisone 50 mg - take 13 hours prior to test 2. Take another Prednisone 50 mg 7 hours prior to test 3. Take another Prednisone 50 mg 1 hour prior to test 4. Take Benadryl 50 mg 1 hour prior to test . Patient must complete all four doses of above prophylactic medications. . Patient will need a ride after test due to Benadryl.  On the Day  of the Test: . Drink plenty of water. Do not drink any water within one hour of the test. . Do not eat any food 4 hours prior to the test. . You may take your regular medications prior to the test.  . Take metoprolol (Lopressor) two hours prior to test. . HOLD Furosemide/Hydrochlorothiazide morning of the test. . FEMALES- please wear underwire-free bra if available   *For Clinical Staff only. Please instruct patient the following:*         -Drink plenty of water       -Hold Furosemide/hydrochlorothiazide morning of the test       -Take metoprolol (Lopressor) 2 hours prior to test (if applicable).                  -If HR is less than 55 BPM- No Lopressor                -IF HR is greater than 55 BPM and patient is less than or equal to 75 yrs old Lopressor 100mg  x1.                -If HR is greater than 55 BPM and patient is greater than 28 yrs old Lopressor 50 mg x1.     Do not give Lopressor to patients with an allergy to lopressor or anyone with asthma or active COPD symptoms (currently taking steroids).       After the Test: . Drink plenty of water. . After receiving IV contrast, you may experience a mild flushed feeling. This is normal. . On occasion, you may experience a mild rash up to 24 hours after the test. This is not dangerous. If this occurs, you can take Benadryl 25 mg and increase your fluid intake. . If you experience trouble breathing, this can be serious. If it is severe call 911 IMMEDIATELY. If it is mild, please call our office. . If you take any of these medications: Glipizide/Metformin, Avandament, Glucavance, please do not take 48 hours after completing test unless otherwise instructed.   Once we have confirmed authorization from your insurance company, we will call you to set up a date and time for your test.   For non-scheduling related questions, please contact the cardiac imaging nurse navigator should you have any questions/concerns: Marchia Bond, RN Navigator Cardiac Imaging [facility] [phone] office  For scheduling needs, including cancellations and rescheduling, please call [phone].       Cardiac CT Angiogram A cardiac CT angiogram is a procedure to look at the heart and the area around the heart. It may be done to help find the cause of chest pains or other symptoms of heart disease. During this procedure, a substance called contrast dye is  injected into the blood vessels in the area to be checked. A large X-ray machine, called a CT scanner, then takes detailed pictures of the heart and the surrounding area. The procedure is also sometimes called a coronary CT angiogram, coronary artery scanning, or CTA. A cardiac CT angiogram allows the health care provider to see how well blood is flowing to and from the heart. The health care provider will be able to see if there are any problems, such as:  Blockage or narrowing of the coronary arteries in the heart.  Fluid around the heart.  Signs of weakness or disease in the muscles, valves, and tissues of the heart. Tell a health care provider about:  Any allergies you have. This is  especially important if you have had a previous allergic reaction to contrast dye.  All medicines you are taking, including vitamins, herbs, eye drops, creams, and over-the-counter medicines.  Any blood disorders you have.  Any surgeries you have had.  Any medical conditions you have.  Whether you are pregnant or may be pregnant.  Any anxiety disorders, chronic pain, or other conditions you have that may increase your stress or prevent you from lying still. What are the risks? Generally, this is a safe procedure. However, problems may occur, including:  Bleeding.  Infection.  Allergic reactions to medicines or dyes.  Damage to other structures or organs.  Kidney damage from the contrast dye that is used.  Increased risk of cancer from radiation exposure. This risk is low. Talk with your health care provider about: ? The risks and benefits of testing. ? How you can receive the lowest dose of radiation. What happens before the procedure?  Wear comfortable clothing and remove any jewelry, glasses, dentures, and hearing aids.  Follow instructions from your health care provider about eating and drinking. This may include: ? For 12 hours before the procedure -- avoid caffeine. This includes tea,  coffee, soda, energy drinks, and diet pills. Drink plenty of water or other fluids that do not have caffeine in them. Being well hydrated can prevent complications. ? For 4-6 hours before the procedure -- stop eating and drinking. The contrast dye can cause nausea, but this is less likely if your stomach is empty.  Ask your health care provider about changing or stopping your regular medicines. This is especially important if you are taking diabetes medicines, blood thinners, or medicines to treat problems with erections (erectile dysfunction). What happens during the procedure?   Hair on your chest may need to be removed so that small sticky patches called electrodes can be placed on your chest. These will transmit information that helps to monitor your heart during the procedure.  An IV will be inserted into one of your veins.  You might be given a medicine to control your heart rate during the procedure. This will help to ensure that good images are obtained.  You will be asked to lie on an exam table. This table will slide in and out of the CT machine during the procedure.  Contrast dye will be injected into the IV. You might feel warm, or you may get a metallic taste in your mouth.  You will be given a medicine called nitroglycerin. This will relax or dilate the arteries in your heart.  The table that you are lying on will move into the CT machine tunnel for the scan.  The person running the machine will give you instructions while the scans are being done. You may be asked to: ? Keep your arms above your head. ? Hold your breath. ? Stay very still, even if the table is moving.  When the scanning is complete, you will be moved out of the machine.  The IV will be removed. The procedure may vary among health care providers and hospitals. What can I expect after the procedure? After your procedure, it is common to have:  A metallic taste in your mouth from the contrast dye.  A  feeling of warmth.  A headache from the nitroglycerin. Follow these instructions at home:  Take over-the-counter and prescription medicines only as told by your health care provider.  If you are told, drink enough fluid to keep your urine pale yellow. This will  help to flush the contrast dye out of your body.  Most people can return to their normal activities right after the procedure. Ask your health care provider what activities are safe for you.  It is up to you to get the results of your procedure. Ask your health care provider, or the department that is doing the procedure, when your results will be ready.  Keep all follow-up visits as told by your health care provider. This is important. Contact a health care provider if:  You have any symptoms of allergy to the contrast dye. These include: ? Shortness of breath. ? Rash or hives. ? A racing heartbeat. Summary  A cardiac CT angiogram is a procedure to look at the heart and the area around the heart. It may be done to help find the cause of chest pains or other symptoms of heart disease.  During this procedure, a large X-ray machine, called a CT scanner, takes detailed pictures of the heart and the surrounding area after a contrast dye has been injected into blood vessels in the area.  Ask your health care provider about changing or stopping your regular medicines before the procedure. This is especially important if you are taking diabetes medicines, blood thinners, or medicines to treat erectile dysfunction.  If you are told, drink enough fluid to keep your urine pale yellow. This will help to flush the contrast dye out of your body. This information is not intended to replace advice given to you by your health care provider. Make sure you discuss any questions you have with your health care provider. Document Revised: 06/30/2019 Document Reviewed: 06/30/2019 Elsevier Patient Education  [place].

## 2020-02-23 NOTE — Progress Notes (Signed)
Cardiology Office Note:    Date:  02/25/2020   ID:  Kim Ward, DOB 01-27-1931, MRN [id number]  PCP:  No primary care provider on file.  Cardiologist:  Kirk Ruths, MD  Electrophysiologist:  None   Referring MD: Linus Mako, NP   Chief Complaint  Patient presents with  . Follow-up    seen for Dr. Stanford Breed    History of Present Illness:    Kim Ward is a 86 y.o. female with a hx of SVT, aortic stenosis, HTN and HLD.  Myoview in December 2013 showed EF 73%, normal perfusion.  Echocardiogram obtained in January 2020 showed vigorous LV function, moderate aortic stenosis and a mean gradient 34 mmHg.  Patient was last seen by Dr. Stanford Breed in February 2021 at which time she was doing well.  Repeat echocardiogram obtained on 12/30/2019 showed normal LV EF 65 to 70%, mild LVH, grade 2 DD, severe aortic stenosis, trace AI and mild LAE.  Dr. Stanford Breed recommended follow-up in 6 months.  More recently, patient presented to the ED on 02/07/2020 for chest pain.  High-sensitivity troponin negative x2.   Patient presents today for evaluation of chest discomfort.  According to her, she was finishing lunch 1 day and was walking back to her apartment at the nursing home.  After she rolled the elevator up to the third floor and was walking to what her room when she started having chest discomfort.  She described it as a substernal chest discomfort pain.  EKG obtained in the emergency room was unchanged.  High-sensitivity troponin was negative.  Since then, she has been able to walk around without recurrent chest discomfort.  My suspicion that her symptom is related to severe aortic stenosis is fairly low.  I did recommend a noninvasive study.  Since patient may ended up needing a TAVR in the future, instead of Myoview, I recommended a coronary CT to make sure she does not have any severe left main disease.  The case has been discussed with DOD Dr. Ellyn Hack.  Otherwise on physical exam, she does not have  any lower extremity edema, orthopnea or PND.  Past Medical History:  Diagnosis Date  . Adenomatous colon polyp   . Aortic stenosis, mild   . Depression    Tx'd 19 yrs ago when husband passed away  . Diverticulosis   . Heart murmur   . Hyperlipidemia   . Hypertension   . Hypothyroidism    Dr. Dwyane Dee follows pt.--had small nodules but had decreased in size and stopped Synthroid 06/2013  . Osteopenia   . Osteoporosis   . Pure hypercholesterolemia   . SVT (supraventricular tachycardia) (HCC)     Past Surgical History:  Procedure Laterality Date  . CERVICAL BIOPSY  W/ LOOP ELECTRODE EXCISION  1996   CIN I  . COLONOSCOPY  03/2001, 01/2007   2008 - no polyps  . DILATION AND CURETTAGE OF UTERUS    . TONSILLECTOMY      Current Medications: Current Meds  Medication Sig  . BIOTIN PO Take 1 tablet by mouth daily.   . bisoprolol (ZEBETA) 5 MG tablet TAKE 1 TABLET ONCE DAILY. (Patient taking differently: Take 5 mg by mouth daily. )  . calcium-vitamin D (CALCIUM 500+D) 500-200 MG-UNIT per tablet Take 1 tablet by mouth daily with breakfast.  . ibuprofen (ADVIL) 200 MG tablet Take 400 mg by mouth every 6 (six) hours as needed. For pain  . loratadine (CLARITIN) 10 MG tablet Take 10 mg by  mouth daily.  [name] losartan (COZAAR) 50 MG tablet Take 50 mg by mouth daily.  . rosuvastatin (CRESTOR) 5 MG tablet Take 5 mg by mouth 3 (three) times a week.     Allergies:   Augmentin [amoxicillin-pot clavulanate], Azithromycin, Bextra [valdecoxib], Cefuroxime, Cefuroxime axetil, Humibid la [guaifenesin], Lipitor [atorvastatin calcium], Ramipril, Risedronate sodium, Tramadol, Zocor [simvastatin], and Niaspan [niacin er]   Social History   Socioeconomic History  . Marital status: Widowed    Spouse name: Not on file  . Number of children: Not on file  . Years of education: Not on file  . Highest education level: Not on file  Occupational History  . Not on file  Tobacco Use  . Smoking status: Former  Smoker    Quit date: 06/17/1972    Years since quitting: 47.7  . Smokeless tobacco: Never Used  . Tobacco comment: quit June 17, 1972  Substance and Sexual Activity  . Alcohol use: Yes    Alcohol/week: 0.0 standard drinks    Comment: 2 drinks per month  . Drug use: No  . Sexual activity: Never    Partners: Male    Birth control/protection: Post-menopausal  Other Topics Concern  . Not on file  Social History Narrative  . Not on file   Social Determinants of Health   Financial Resource Strain:   . Difficulty of Paying Living Expenses:   Food Insecurity:   . Worried About Charity fundraiser in the Last Year:   . Arboriculturist in the Last Year:   Transportation Needs:   . Film/video editor (Medical):   [name] Lack of Transportation (Non-Medical):   Physical Activity:   . Days of Exercise per Week:   . Minutes of Exercise per Session:   Stress:   . Feeling of Stress :   Social Connections:   . Frequency of Communication with Friends and Family:   . Frequency of Social Gatherings with Friends and Family:   . Attends Religious Services:   . Active Member of Clubs or Organizations:   . Attends Archivist Meetings:   [name] Marital Status:      Family History: The patient's family history includes Breast cancer in her sister; Breast cancer (age of onset: 77) in her daughter; Stroke in her sister.  ROS:   Please see the history of present illness.     All other systems reviewed and are negative.  EKGs/Labs/Other Studies Reviewed:    The following studies were reviewed today:  Echo 12/30/2019 1. Normal LV systolic function; mild LVH; grade 2 diastolic dysfunction;  severe AS (mean gradient 45 mmHg) and trace AI; mild LAE.  2. Left ventricular ejection fraction, by estimation, is 65 to 70%. The  left ventricle has normal function. The left ventrical has no regional  wall motion abnormalities. There is mildly increased left ventricular  hypertrophy. Left  ventricular diastolic  parameters are consistent with Grade II diastolic dysfunction  (pseudonormalization).  3. Right ventricular systolic function is normal. The right ventricular  size is normal. There is mildly elevated pulmonary artery systolic  pressure.  4. Left atrial size was mildly dilated.  5. The mitral valve is normal in structure and function. trivial mitral  valve regurgitation. No evidence of mitral stenosis.  6. The aortic valve has an indeterminant number of cusps. Aortic valve  regurgitation is trivial . Severe aortic valve stenosis.  7. The inferior vena cava is normal in size with greater than 50%  respiratory  variability, suggesting right atrial pressure of 3 mmHg.   EKG:  EKG is not ordered today.    Recent Labs: 02/07/2020: ALT 14; BUN 21; Creatinine, Ser 0.83; Hemoglobin 11.4; Platelets 232; Potassium 4.0; Sodium 140  Recent Lipid Panel    Component Value Date/Time   CHOL 153 06/24/2016 1410   TRIG 156 (H) 06/24/2016 1410   HDL 55 06/24/2016 1410   CHOLHDL 2.8 06/24/2016 1410   VLDL 31 (H) 06/24/2016 1410   LDLCALC 67 06/24/2016 1410   LDLDIRECT 140.2 06/24/2014 0843    Physical Exam:    VS:  BP 132/62   Pulse 60   Temp (!) 96.8 F (36 C) Comment: Forehead  Ht 5\' 6"  (1.676 m)   Wt 163 lb 6.4 oz (74.1 kg)   LMP 11/19/1983   SpO2 98%   BMI 26.37 kg/m     Wt Readings from Last 3 Encounters:  02/23/20 163 lb 6.4 oz (74.1 kg)  02/07/20 160 lb (72.6 kg)  12/21/19 166 lb (75.3 kg)     GEN:  Well nourished, well developed in no acute distress HEENT: Normal NECK: No JVD; No carotid bruits LYMPHATICS: No lymphadenopathy CARDIAC: RRR, no rubs, gallops  3/6 systolic murmur RESPIRATORY:  Clear to auscultation without rales, wheezing or rhonchi  ABDOMEN: Soft, non-tender, non-distended MUSCULOSKELETAL:  No edema; No deformity  SKIN: Warm and dry NEUROLOGIC:  Alert and oriented x 3 PSYCHIATRIC:  Normal affect   ASSESSMENT:    1. Precordial  chest pain   2. Pre-procedural cardiovascular examination   3. Severe aortic stenosis   4. Essential hypertension   5. Hyperlipidemia LDL goal <70    PLAN:    In order of problems listed above:  1. Precordial chest pain: She was seen in the emergency room recently, troponin was negative.  EKG does not show any significant changes.  We discussed possibility of either Myoview or coronary CT, since she might have TAVR in the future, after discussing with DOD Dr. Ellyn Hack, we will proceed with coronary CT with FFR to make sure she at least does not have any severe left main disease  2. Severe aortic stenosis: Seen on recent echocardiogram in February 2021.  Plan for repeat echocardiogram in 6 months.  Since patient does not have any recurrent chest discomfort with ambulation since ED visit, suspicion that her symptom is related to severe aortic stenosis is fairly low.  3. Hypertension: Continue on current therapy  4. Hyperlipidemia: On Crestor.   Medication Adjustments/Labs and Tests Ordered: Current medicines are reviewed at length with the patient today.  Concerns regarding medicines are outlined above.  Orders Placed This Encounter  Procedures  . Basic metabolic panel   No orders of the defined types were placed in this encounter.   Patient Instructions  Medication Instructions:   DO NOT take your Losartan on the morning of the Coronary CT  TAKE 7.5 mg (1.5 tablets) of Bisoprolol (Zebeta) the morning of the Coronary CT Your physician recommends that you continue on your current medications as directed. Please refer to the Current Medication list given to you today.  *If you need a refill on your cardiac medications before your next appointment, please call your pharmacy*  Lab Work: Your physician recommends that you return for lab work 1 month prior to Coronary CT:  BMET  If you have labs (blood work) drawn today and your tests are completely normal, you will receive your  results only by: [name] MyChart Message (if you have  MyChart) OR . A paper copy in the mail If you have any lab test that is abnormal or we need to change your treatment, we will call you to review the results.  Testing/Procedures: Your physician has requested that you have cardiac CT.   Follow-Up: At [facility], you and your health needs are our priority.  As part of our continuing mission to provide you with exceptional heart care, we have created designated Provider Care Teams.  These Care Teams include your primary Cardiologist (physician) and Advanced Practice Providers (APPs -  Physician Assistants and Nurse Practitioners) who all work together to provide you with the care you need, when you need it.  We recommend signing up for the patient portal called "MyChart".  Sign up information is provided on this After Visit Summary.  MyChart is used to connect with patients for Virtual Visits (Telemedicine).  Patients are able to view lab/test results, encounter notes, upcoming appointments, etc.  Non-urgent messages can be sent to your provider as well.   To learn more about what you can do with MyChart, go to [email or web address].    Your next appointment:   4 month(s)  The format for your next appointment:   In Person  Provider:   Kirk Ruths, MD  Other Instructions  Your cardiac CT will be scheduled at one of the below locations:   [facility] [address] [place], [place] [address] [phone]  [place] [address] [place], [place] [address] [phone]  If scheduled at [facility], please arrive at the [facility] main entrance of [facility] 30 minutes prior to test start time. Proceed to the [facility] Radiology Department (first floor) to check-in and test prep.  If scheduled at [facility], please arrive 15 mins early for check-in and test  prep.  Please follow these instructions carefully (unless otherwise directed):    On the Night Before the Test: . Be sure to Drink plenty of water. . Do not consume any caffeinated/decaffeinated beverages or chocolate 12 hours prior to your test. . Do not take any antihistamines 12 hours prior to your test. . If you take Metformin do not take 24 hours prior to test. . If the patient has contrast allergy: ? Patient will need a prescription for Prednisone and very clear instructions (as follows): 1. Prednisone 50 mg - take 13 hours prior to test 2. Take another Prednisone 50 mg 7 hours prior to test 3. Take another Prednisone 50 mg 1 hour prior to test 4. Take Benadryl 50 mg 1 hour prior to test . Patient must complete all four doses of above prophylactic medications. . Patient will need a ride after test due to Benadryl.  On the Day of the Test: . Drink plenty of water. Do not drink any water within one hour of the test. . Do not eat any food 4 hours prior to the test. . You may take your regular medications prior to the test.  . Take metoprolol (Lopressor) two hours prior to test. . HOLD Furosemide/Hydrochlorothiazide morning of the test. . FEMALES- please wear underwire-free bra if available   *For Clinical Staff only. Please instruct patient the following:*        -Drink plenty of water       -Hold Furosemide/hydrochlorothiazide morning of the test       -Take metoprolol (Lopressor) 2 hours prior to test (if applicable).                  -  If HR is less than 55 BPM- No Lopressor                -IF HR is greater than 55 BPM and patient is less than or equal to 52 yrs old Lopressor 100mg  x1.                -If HR is greater than 55 BPM and patient is greater than 76 yrs old Lopressor 50 mg x1.     Do not give Lopressor to patients with an allergy to lopressor or anyone with asthma or active COPD symptoms (currently taking steroids).       After the Test: . Drink plenty of  water. . After receiving IV contrast, you may experience a mild flushed feeling. This is normal. . On occasion, you may experience a mild rash up to 24 hours after the test. This is not dangerous. If this occurs, you can take Benadryl 25 mg and increase your fluid intake. . If you experience trouble breathing, this can be serious. If it is severe call 911 IMMEDIATELY. If it is mild, please call our office. . If you take any of these medications: Glipizide/Metformin, Avandament, Glucavance, please do not take 48 hours after completing test unless otherwise instructed.   Once we have confirmed authorization from your insurance company, we will call you to set up a date and time for your test.   For non-scheduling related questions, please contact the cardiac imaging nurse navigator should you have any questions/concerns: Marchia Bond, RN Navigator Cardiac Imaging [facility] [phone] office  For scheduling needs, including cancellations and rescheduling, please call [phone].       Cardiac CT Angiogram A cardiac CT angiogram is a procedure to look at the heart and the area around the heart. It may be done to help find the cause of chest pains or other symptoms of heart disease. During this procedure, a substance called contrast dye is injected into the blood vessels in the area to be checked. A large X-ray machine, called a CT scanner, then takes detailed pictures of the heart and the surrounding area. The procedure is also sometimes called a coronary CT angiogram, coronary artery scanning, or CTA. A cardiac CT angiogram allows the health care provider to see how well blood is flowing to and from the heart. The health care provider will be able to see if there are any problems, such as:  Blockage or narrowing of the coronary arteries in the heart.  Fluid around the heart.  Signs of weakness or disease in the muscles, valves, and tissues of the heart. Tell  a health care provider about:  Any allergies you have. This is especially important if you have had a previous allergic reaction to contrast dye.  All medicines you are taking, including vitamins, herbs, eye drops, creams, and over-the-counter medicines.  Any blood disorders you have.  Any surgeries you have had.  Any medical conditions you have.  Whether you are pregnant or may be pregnant.  Any anxiety disorders, chronic pain, or other conditions you have that may increase your stress or prevent you from lying still. What are the risks? Generally, this is a safe procedure. However, problems may occur, including:  Bleeding.  Infection.  Allergic reactions to medicines or dyes.  Damage to other structures or organs.  Kidney damage from the contrast dye that is used.  Increased risk of cancer from radiation exposure. This risk is low. Talk with  your health care provider about: ? The risks and benefits of testing. ? How you can receive the lowest dose of radiation. What happens before the procedure?  Wear comfortable clothing and remove any jewelry, glasses, dentures, and hearing aids.  Follow instructions from your health care provider about eating and drinking. This may include: ? For 12 hours before the procedure -- avoid caffeine. This includes tea, coffee, soda, energy drinks, and diet pills. Drink plenty of water or other fluids that do not have caffeine in them. Being well hydrated can prevent complications. ? For 4-6 hours before the procedure -- stop eating and drinking. The contrast dye can cause nausea, but this is less likely if your stomach is empty.  Ask your health care provider about changing or stopping your regular medicines. This is especially important if you are taking diabetes medicines, blood thinners, or medicines to treat problems with erections (erectile dysfunction). What happens during the procedure?   Hair on your chest may need to be removed so  that small sticky patches called electrodes can be placed on your chest. These will transmit information that helps to monitor your heart during the procedure.  An IV will be inserted into one of your veins.  You might be given a medicine to control your heart rate during the procedure. This will help to ensure that good images are obtained.  You will be asked to lie on an exam table. This table will slide in and out of the CT machine during the procedure.  Contrast dye will be injected into the IV. You might feel warm, or you may get a metallic taste in your mouth.  You will be given a medicine called nitroglycerin. This will relax or dilate the arteries in your heart.  The table that you are lying on will move into the CT machine tunnel for the scan.  The person running the machine will give you instructions while the scans are being done. You may be asked to: ? Keep your arms above your head. ? Hold your breath. ? Stay very still, even if the table is moving.  When the scanning is complete, you will be moved out of the machine.  The IV will be removed. The procedure may vary among health care providers and hospitals. What can I expect after the procedure? After your procedure, it is common to have:  A metallic taste in your mouth from the contrast dye.  A feeling of warmth.  A headache from the nitroglycerin. Follow these instructions at home:  Take over-the-counter and prescription medicines only as told by your health care provider.  If you are told, drink enough fluid to keep your urine pale yellow. This will help to flush the contrast dye out of your body.  Most people can return to their normal activities right after the procedure. Ask your health care provider what activities are safe for you.  It is up to you to get the results of your procedure. Ask your health care provider, or the department that is doing the procedure, when your results will be ready.  Keep all  follow-up visits as told by your health care provider. This is important. Contact a health care provider if:  You have any symptoms of allergy to the contrast dye. These include: ? Shortness of breath. ? Rash or hives. ? A racing heartbeat. Summary  A cardiac CT angiogram is a procedure to look at the heart and the area around the heart. It may  be done to help find the cause of chest pains or other symptoms of heart disease.  During this procedure, a large X-ray machine, called a CT scanner, takes detailed pictures of the heart and the surrounding area after a contrast dye has been injected into blood vessels in the area.  Ask your health care provider about changing or stopping your regular medicines before the procedure. This is especially important if you are taking diabetes medicines, blood thinners, or medicines to treat erectile dysfunction.  If you are told, drink enough fluid to keep your urine pale yellow. This will help to flush the contrast dye out of your body. This information is not intended to replace advice given to you by your health care provider. Make sure you discuss any questions you have with your health care provider. Document Revised: 06/30/2019 Document Reviewed: 06/30/2019 Elsevier Patient Education  2020 [place], [place], [place]  02/25/2020 11:01 PM    [place]

## 2020-02-25 ENCOUNTER — Encounter: Payer: Self-pay | Admitting: Physician Assistant

## 2020-05-05 ENCOUNTER — Telehealth: Payer: Self-pay | Admitting: Cardiology

## 2020-05-05 NOTE — Telephone Encounter (Signed)
Patient calling to schedule appt with Dr. Stanford Breed in August. She is very adament on only seeing Dr. Stanford Breed. She does not want the 07/31/20 appointment because it is too late in the day nor the 08/15/20 because that is too far out. She does not want to do virtual. She is requesting to speak with someone else in regards to scheduling an appointment in August with him. Please advise if you can help.

## 2020-05-05 NOTE — Telephone Encounter (Signed)
Spoke with pt, Follow up scheduled in the [place] office.

## 2020-06-03 ENCOUNTER — Emergency Department (HOSPITAL_COMMUNITY)
Admission: EM | Admit: 2020-06-03 | Discharge: 2020-06-03 | Disposition: A | Discharge status: Home / Self Care | Payer: Medicare PPO | Attending: Emergency Medicine | Admitting: Emergency Medicine

## 2020-06-03 ENCOUNTER — Other Ambulatory Visit: Payer: Self-pay

## 2020-06-03 DIAGNOSIS — R55 Syncope and collapse: Secondary | ICD-10-CM | POA: Diagnosis not present

## 2020-06-03 DIAGNOSIS — R42 Dizziness and giddiness: Secondary | ICD-10-CM | POA: Diagnosis present

## 2020-06-03 DIAGNOSIS — E039 Hypothyroidism, unspecified: Secondary | ICD-10-CM | POA: Insufficient documentation

## 2020-06-03 DIAGNOSIS — I119 Hypertensive heart disease without heart failure: Secondary | ICD-10-CM | POA: Insufficient documentation

## 2020-06-03 DIAGNOSIS — Z79899 Other long term (current) drug therapy: Secondary | ICD-10-CM | POA: Diagnosis not present

## 2020-06-03 DIAGNOSIS — Z803 Family history of malignant neoplasm of breast: Secondary | ICD-10-CM | POA: Insufficient documentation

## 2020-06-03 LAB — URINALYSIS, ROUTINE W REFLEX MICROSCOPIC
Bilirubin Urine: NEGATIVE
Glucose, UA: NEGATIVE mg/dL
Hgb urine dipstick: NEGATIVE
Ketones, ur: NEGATIVE mg/dL
Leukocytes,Ua: NEGATIVE
Nitrite: NEGATIVE
Protein, ur: NEGATIVE mg/dL
Specific Gravity, Urine: 1.004 — ABNORMAL LOW (ref 1.005–1.030)
pH: 8 (ref 5.0–8.0)

## 2020-06-03 LAB — CBC WITH DIFFERENTIAL/PLATELET
Abs Immature Granulocytes: 0.01 10*3/uL (ref 0.00–0.07)
Basophils Absolute: 0.1 10*3/uL (ref 0.0–0.1)
Basophils Relative: 1 %
Eosinophils Absolute: 0.1 10*3/uL (ref 0.0–0.5)
Eosinophils Relative: 1 %
HCT: 41 % (ref 36.0–46.0)
Hemoglobin: 12.8 g/dL (ref 12.0–15.0)
Immature Granulocytes: 0 %
Lymphocytes Relative: 30 %
Lymphs Abs: 1.8 10*3/uL (ref 0.7–4.0)
MCH: 28.7 pg (ref 26.0–34.0)
MCHC: 31.2 g/dL (ref 30.0–36.0)
MCV: 91.9 fL (ref 80.0–100.0)
Monocytes Absolute: 0.5 10*3/uL (ref 0.1–1.0)
Monocytes Relative: 9 %
Neutro Abs: 3.5 10*3/uL (ref 1.7–7.7)
Neutrophils Relative %: 59 %
Platelets: 243 10*3/uL (ref 150–400)
RBC: 4.46 MIL/uL (ref 3.87–5.11)
RDW: 13 % (ref 11.5–15.5)
WBC: 5.9 10*3/uL (ref 4.0–10.5)
nRBC: 0 % (ref 0.0–0.2)

## 2020-06-03 LAB — BASIC METABOLIC PANEL
Anion gap: 10 (ref 5–15)
BUN: 13 mg/dL (ref 8–23)
CO2: 23 mmol/L (ref 22–32)
Calcium: 9.3 mg/dL (ref 8.9–10.3)
Chloride: 106 mmol/L (ref 98–111)
Creatinine, Ser: 0.79 mg/dL (ref 0.44–1.00)
GFR calc Af Amer: >60 mL/min (ref >60)
GFR calc non Af Amer: >60 mL/min (ref >60)
Glucose, Bld: 89 mg/dL (ref 70–99)
Potassium: 4.1 mmol/L (ref 3.5–5.1)
Sodium: 139 mmol/L (ref 135–145)

## 2020-06-03 LAB — CBG MONITORING, ED: Glucose-Capillary: 87 mg/dL (ref 70–99)

## 2020-06-03 MED ORDER — SODIUM CHLORIDE 0.9 % IV BOLUS
1000.0000 mL | Freq: Once | INTRAVENOUS | Status: AC
  Administered 2020-06-03: 1000 mL via INTRAVENOUS

## 2020-06-03 NOTE — ED Triage Notes (Signed)
Pt arrives EMS from [facility] independent living with complaints of being dizzy. Neg orthostatic with EMS.

## 2020-06-03 NOTE — Discharge Instructions (Addendum)
You have been evaluated for your heart palpitation.  Fortunately your EKG today appears normal.  Your labs are normal.  Please call and follow-up with your cardiologist for recheck.  Return promptly if you have any concern.  Stay hydrated.

## 2020-06-03 NOTE — ED Provider Notes (Signed)
[facility] Provider Note   CSN: [id number] Arrival date & time: 06/03/20  1229     History Chief Complaint  Patient presents with  . Dizziness    Kim Ward is a 86 y.o. female.  The history is provided by the patient and medical records. No language interpreter was used.     86 year old female with history of aortic stenosis, heart murmur, hypothyroidism, SVT presenting for evaluation of dizziness.  Patient report this morning she was starting the day as usual.  She had breakfast, she was in the process of getting ready to go grocery shopping when she noticed her heart racing really fast and beating abnormally.  Furthermore she also endorsed feeling dizziness in which she described as a lightheadedness as if she was going to pass out.  Symptom lasting for what seems to be 10 to 20 minutes which concerns her.  She called the staff who came to her aid and contact EMS to bring her here.  Symptom has mostly resolved.  She does not complain of any headache, diplopia, confusion, facial numbness, neck pain, chest pain, trouble breathing, abdominal pain, focal numbness or focal weakness.  She denies any change in her diet of medication.  She denies any increase in use of stimulant such as coffee.  No nausea vomiting or diarrhea.  No prior history of stroke.  Past Medical History:  Diagnosis Date  . Adenomatous colon polyp   . Aortic stenosis, mild   . Depression    Tx'd 19 yrs ago when husband passed away  . Diverticulosis   . Heart murmur   . Hyperlipidemia   . Hypertension   . Hypothyroidism    Dr. Dwyane Dee follows pt.--had small nodules but had decreased in size and stopped Synthroid 06/2013  . Osteopenia   . Osteoporosis   . Pure hypercholesterolemia   . SVT (supraventricular tachycardia) [facility])     Patient Active Problem List   Diagnosis Date Noted  . Hypertensive cardiovascular disease 06/26/2016  . Dizziness 06/26/2016  . Low back pain  07/26/2015  . Constipation 03/01/2014  . Alopecia 09/29/2013  . Multinodular goiter 07/22/2013  . Benign hypertensive heart disease without heart failure 03/08/2013  . Elevated troponin 10/10/2012  . SVT (supraventricular tachycardia) ([place]) 12/31/2011  . Hypercholesteremia 02/11/2011  . Hypothyroid 02/11/2011  . Aortic stenosis, mild 02/11/2011    Past Surgical History:  Procedure Laterality Date  . CERVICAL BIOPSY  W/ LOOP ELECTRODE EXCISION  1996   CIN I  . COLONOSCOPY  03/2001, 01/2007   2008 - no polyps  . DILATION AND CURETTAGE OF UTERUS    . TONSILLECTOMY       OB History    Gravida  5   Para  3   Term      Preterm      AB      Living  3     SAB      TAB      Ectopic      Multiple      Live Births              Family History  Problem Relation Age of Onset  . Stroke Sister   . Breast cancer Sister   . Breast cancer Daughter 8       metastatic breast ca    Social History   Tobacco Use  . Smoking status: Former Smoker    Quit date: 06/17/1972    Years since  quitting: 47.9  . Smokeless tobacco: Never Used  . Tobacco comment: quit June 17, 1972  Substance Use Topics  . Alcohol use: Yes    Alcohol/week: 0.0 standard drinks    Comment: 2 drinks per month  . Drug use: No    Home Medications Prior to Admission medications   Medication Sig Start Date End Date Taking? Authorizing Provider  BIOTIN PO Take 1 tablet by mouth daily.     [provider]  bisoprolol (ZEBETA) 5 MG tablet TAKE 1 TABLET ONCE DAILY. Patient taking differently: Take 5 mg by mouth daily.  03/29/19   Lelon Perla, MD  calcium-vitamin D (CALCIUM 500+D) 500-200 MG-UNIT per tablet Take 1 tablet by mouth daily with breakfast.    [provider]  Cholecalciferol (VITAMIN D3) 10000 UNITS capsule Take 10,000 Units by mouth daily. Takes 1000 units daily (not 10,000 units)    [provider]  ibuprofen (ADVIL) 200 MG tablet Take 400 mg by mouth  every 6 (six) hours as needed. For pain    [provider]  loratadine (CLARITIN) 10 MG tablet Take 10 mg by mouth daily.    [provider]  losartan (COZAAR) 50 MG tablet Take 50 mg by mouth daily.    [provider]  potassium chloride SA (K-DUR,KLOR-CON) 20 MEQ tablet TAKE 1 TABLET ONCE DAILY. Patient not taking: Reported on 02/07/2020 12/09/16   Lelon Perla, MD  rosuvastatin (CRESTOR) 5 MG tablet Take 5 mg by mouth 3 (three) times a week. 02/21/20   [provider]    Allergies    Augmentin [amoxicillin-pot clavulanate], Azithromycin, Bextra [valdecoxib], Cefuroxime, Cefuroxime axetil, Humibid la [guaifenesin], Lipitor [atorvastatin calcium], Ramipril, Risedronate sodium, Tramadol, Zocor [simvastatin], and Niaspan [niacin er]  Review of Systems   Review of Systems  All other systems reviewed and are negative.   Physical Exam Updated Vital Signs BP (!) 147/51 (BP Location: Right Arm)   Pulse 60   Temp (P) 98 F (36.7 C) (Oral)   Resp 16   Ht 5\' 7"  (1.702 m)   Wt 70.3 kg   LMP 11/19/1983   SpO2 97%   BMI 24.28 kg/m   Physical Exam Vitals and nursing note reviewed.  Constitutional:      General: She is not in acute distress.    Appearance: She is well-developed.  HENT:     Head: Atraumatic.     Right Ear: Tympanic membrane normal.     Left Ear: Tympanic membrane normal.     Nose: Nose normal.  Eyes:     Extraocular Movements: Extraocular movements intact.     Conjunctiva/sclera: Conjunctivae normal.     Pupils: Pupils are equal, round, and reactive to light.  Cardiovascular:     Rate and Rhythm: Normal rate and regular rhythm.     Pulses: Normal pulses.     Heart sounds: Normal heart sounds.  Pulmonary:     Effort: Pulmonary effort is normal.     Breath sounds: Normal breath sounds.  Abdominal:     Palpations: Abdomen is soft.     Tenderness: There is no abdominal tenderness.  Musculoskeletal:     Cervical back: Neck  supple.     Comments: 5 out of 5 strength to all 4 extremities.  Skin:    Findings: No rash.  Neurological:     Mental Status: She is alert and oriented to person, place, and time.     Comments: Neurologic exam:  Speech clear, pupils equal  round reactive to light, extraocular movements intact  Normal peripheral visual fields Cranial nerves III through XII normal including no facial droop Follows commands, moves all extremities x4, normal strength to bilateral upper and lower extremities at all major muscle groups including grip Sensation normal to light touch  Coordination intact, no limb ataxia, finger-nose-finger normal No pronator drift Gait normal   Psychiatric:        Mood and Affect: Mood normal.     ED Results / Procedures / Treatments   Labs (all labs ordered are listed, but only abnormal results are displayed) Labs Reviewed  URINALYSIS, ROUTINE W REFLEX MICROSCOPIC - Abnormal; Notable for the following components:      Result Value   Color, Urine STRAW (*)    Specific Gravity, Urine 1.004 (*)    All other components within normal limits  BASIC METABOLIC PANEL  CBC WITH DIFFERENTIAL/PLATELET  CBG MONITORING, ED    EKG None  ED ECG REPORT   Date: 06/03/2020  Rate: 68  Rhythm: normal sinus rhythm  QRS Axis: normal  Intervals: PR prolonged  ST/T Wave abnormalities: normal  Conduction Disutrbances:none  Narrative Interpretation:   Old EKG Reviewed: unchanged  I have personally reviewed the EKG tracing and agree with the computerized printout as noted.   Radiology No results found.  Procedures Procedures (including critical care time)  Medications Ordered in ED Medications - No data to display  ED Course  I have reviewed the triage vital signs and the nursing notes.  Pertinent labs & imaging results that were available during my care of the patient were reviewed by me and considered in my medical decision making (see chart for details).    MDM  Rules/Calculators/A&P                          BP (!) 147/51 (BP Location: Right Arm)   Pulse 60   Temp (P) 98 F (36.7 C) (Oral)   Resp 16   Ht 5\' 7"  (1.702 m)   Wt 70.3 kg   LMP 11/19/1983   SpO2 97%   BMI 24.28 kg/m   Final Clinical Impression(s) / ED Diagnoses Final diagnoses:  Near syncope    Rx / DC Orders ED Discharge Orders    None     1:15 PM Patient with history of SVT here with complaints of heart palpitation and lightheadedness/dizziness started earlier this morning and has since resolved.  She is currently in no acute discomfort, she is not tachycardic.  She does not have any vertiginous symptoms on exam.  No focal neuro deficit.  Work-up initiated.  Care discussed with Dr. Tamera Punt.   2:35 PM EKG without concerning arrhythmia or SVT.  Normal CBG.  UA without signs of urinary tract infection.  Electrolyte panels are reassuring, normal kidney function, normal WBC, normal H&H.  At this time patient is resting comfortably in no acute discomfort.  No hypotension.  She is stable for discharge.  Outpatient follow-up with PCP recommended, return precaution discussed.   Domenic Moras, PA-C 06/03/20 1504    Malvin Johns, MD 06/04/20 [phone]

## 2020-06-22 NOTE — Progress Notes (Signed)
HPI: FU paroxysmal supraventricular tachycardia, AS,hypercholesterolemia, and benign hypertensive heart disease.Nuclear study December 2013 showed ejection fraction 73% and normal perfusion.Echocardiogram February 2021 showed normal LV systolic function, mild left ventricular hypertrophy, grade 2 diastolic dysfunction, severe aortic stenosis with mean gradient 45 mmHg, trace aortic insufficiency and mild left atrial enlargement. Since last seen,patient notes worsening dyspnea on exertion.  She denies orthopnea, PND, pedal edema, chest pain or syncope.  She has had occasional "dizzy spells".  Current Outpatient Medications  Medication Sig Dispense Refill   ALPRAZolam (XANAX) 0.5 MG tablet Take 0.5 mg by mouth as directed.     amLODipine (NORVASC) 2.5 MG tablet Take 2.5 mg by mouth at bedtime.     amoxicillin (AMOXIL) 500 MG capsule Take 500 mg by mouth as directed. 4 capsules before dental procedures     BIOTIN PO Take 1 tablet by mouth daily.      bisoprolol (ZEBETA) 5 MG tablet TAKE 1 TABLET ONCE DAILY. (Patient taking differently: Take 5 mg by mouth daily. ) 90 tablet 3   calcium-vitamin D (CALCIUM 500+D) 500-200 MG-UNIT per tablet Take 1 tablet by mouth daily with breakfast.     ibuprofen (ADVIL) 200 MG tablet Take 400 mg by mouth every 6 (six) hours as needed. For pain     loratadine (CLARITIN) 10 MG tablet Take 10 mg by mouth daily.     losartan (COZAAR) 50 MG tablet Take 50 mg by mouth daily.     rosuvastatin (CRESTOR) 5 MG tablet Take 5 mg by mouth 3 (three) times a week.     No current facility-administered medications for this visit.     Past Medical History:  Diagnosis Date   Adenomatous colon polyp    Aortic stenosis, mild    Depression    Tx'd 19 yrs ago when husband passed away   Diverticulosis    Heart murmur    Hyperlipidemia    Hypertension    Hypothyroidism    Dr. Dwyane Dee follows pt.--had small nodules but had decreased in size and stopped  Synthroid 06/2013   Osteopenia    Osteoporosis    Pure hypercholesterolemia    SVT (supraventricular tachycardia) [facility])     Past Surgical History:  Procedure Laterality Date   CERVICAL BIOPSY  W/ LOOP ELECTRODE EXCISION  1996   CIN I   COLONOSCOPY  03/2001, 01/2007   2008 - no polyps   DILATION AND CURETTAGE OF UTERUS     TONSILLECTOMY      Social History   Socioeconomic History   Marital status: Widowed    Spouse name: Not on file   Number of children: Not on file   Years of education: Not on file   Highest education level: Not on file  Occupational History   Not on file  Tobacco Use   Smoking status: Former Smoker    Quit date: 06/17/1972    Years since quitting: 48.0   Smokeless tobacco: Never Used   Tobacco comment: quit June 17, 1972  Substance and Sexual Activity   Alcohol use: Yes    Alcohol/week: 0.0 standard drinks    Comment: 2 drinks per month   Drug use: No   Sexual activity: Never    Partners: Male    Birth control/protection: Post-menopausal  Other Topics Concern   Not on file  Social History Narrative   Not on file   Social Determinants of Health   Financial Resource Strain:    Difficulty of Paying Living  Expenses:   Food Insecurity:    Worried About Charity fundraiser in the Last Year:    Arboriculturist in the Last Year:   Transportation Needs:    Film/video editor (Medical):    Lack of Transportation (Non-Medical):   Physical Activity:    Days of Exercise per Week:    Minutes of Exercise per Session:   Stress:    Feeling of Stress :   Social Connections:    Frequency of Communication with Friends and Family:    Frequency of Social Gatherings with Friends and Family:    Attends Religious Services:    Active Member of Clubs or Organizations:    Attends Music therapist:    Marital Status:   Intimate Partner Violence:    Fear of Current or Ex-Partner:    Emotionally Abused:     Physically Abused:    Sexually Abused:     Family History  Problem Relation Age of Onset   Stroke Sister    Breast cancer Sister    Breast cancer Daughter 49       metastatic breast ca    ROS: no fevers or chills, productive cough, hemoptysis, dysphasia, odynophagia, melena, hematochezia, dysuria, hematuria, rash, seizure activity, orthopnea, PND, pedal edema, claudication. Remaining systems are negative.  Physical Exam: Well-developed well-nourished in no acute distress.  Skin is warm and dry.  HEENT is normal.  Neck is supple.  Chest is clear to auscultation with normal expansion.  Cardiovascular exam is regular rate and rhythm.  3/6 systolic murmur left sternal border. Abdominal exam nontender or distended. No masses palpated. Extremities show no edema. neuro grossly intact   A/P  1 severe aortic stenosis-Most recent echocardiogram showed severe stenosis with mean gradient 45 mmHg.  She is now describing worsening dyspnea on exertion likely related to her aortic stenosis.  We will plan to repeat echocardiogram.  I discussed TAVR today.  I think we should proceed at this point given that she is symptomatic.  She would like to discuss this with her daughter and then she will contact us concerning moving forward.  I did discuss the risks of cardiac catheterization including myocardial infarction, CVA and death.  If she is agreeable we will arrange the procedure with Dr. Angelena Form or Dr. Burt Knack followed by surgical consult for TAVR.  2 SVT-no recurrent episodes.  Continue beta-blocker.  3 hypertension-blood pressure controlled.  Continue present medications.  4 hyperlipidemia-Per primary care.  Kirk Ruths, MD

## 2020-07-05 ENCOUNTER — Other Ambulatory Visit: Payer: Self-pay

## 2020-07-05 ENCOUNTER — Encounter: Payer: Self-pay | Admitting: Cardiology

## 2020-07-05 ENCOUNTER — Ambulatory Visit: Payer: Medicare PPO | Admitting: Cardiology

## 2020-07-05 VITALS — BP 138/56 | HR 63 | Ht 67.0 in | Wt 162.4 lb

## 2020-07-05 DIAGNOSIS — E785 Hyperlipidemia, unspecified: Secondary | ICD-10-CM

## 2020-07-05 DIAGNOSIS — I10 Essential (primary) hypertension: Secondary | ICD-10-CM

## 2020-07-05 DIAGNOSIS — I350 Nonrheumatic aortic (valve) stenosis: Secondary | ICD-10-CM | POA: Diagnosis not present

## 2020-07-05 NOTE — Patient Instructions (Signed)
Medication Instructions:  NO CHANGE *If you need a refill on your cardiac medications before your next appointment, please call your pharmacy*   Lab Work: If you have labs (blood work) drawn today and your tests are completely normal, you will receive your results only by: [name] MyChart Message (if you have MyChart) OR . A paper copy in the mail If you have any lab test that is abnormal or we need to change your treatment, we will call you to review the results.   Testing/Procedures:  Your physician has requested that you have an echocardiogram. Echocardiography is a painless test that uses sound waves to create images of your heart. It provides your doctor with information about the size and shape of your heart and how well your heart's chambers and valves are working. This procedure takes approximately one hour. There are no restrictions for this procedure.[facility]   Follow-Up: At [facility], you and your health needs are our priority.  As part of our continuing mission to provide you with exceptional heart care, we have created designated Provider Care Teams.  These Care Teams include your primary Cardiologist (physician) and Advanced Practice Providers (APPs -  Physician Assistants and Nurse Practitioners) who all work together to provide you with the care you need, when you need it.  We recommend signing up for the patient portal called "MyChart".  Sign up information is provided on this After Visit Summary.  MyChart is used to connect with patients for Virtual Visits (Telemedicine).  Patients are able to view lab/test results, encounter notes, upcoming appointments, etc.  Non-urgent messages can be sent to your provider as well.   To learn more about what you can do with MyChart, go to [email or web address].    Your next appointment:   3 month(s)  The format for your next appointment:   In Person  Provider:   Kirk Ruths, MD    Transcatheter Aortic Valve  Replacement  Transcatheter aortic valve replacement (TAVR) is a procedure to place an artificial aortic valve in the heart. The aortic valve controls blood flow between the heart and the main blood vessel that supplies blood to the body (aorta). During TAVR, a flexible tube (catheter) is placed through an incision in the groin, chest, or neck and is guided to the aortic valve. Then, the artificial valve is moved through the catheter and into the aortic valve, where it is expanded. The new valve takes over the function of the old valve, and it improves blood flow through the heart. This procedure is also called transcatheter aortic valve implantation (TAVI). You may need TAVR if you have a stiff aortic valve (aortic stenosis) that is causing symptoms and open heart valve replacement surgery is not an option for you. Tell a health care provider about:  Any allergies you have.  All medicines you are taking, including vitamins, herbs, eye drops, creams, and over-the-counter medicines.  Any problems you or family members have had with anesthetic medicines.  Any blood disorders you have.  Any surgeries you have had.  Any medical conditions you have.  Whether you are pregnant or may be pregnant. What are the risks? Generally, this is a safe procedure. However, problems may occur, including:  Bleeding.  Damage to blood vessels or the heart.  Stroke.  Blood clots.  Abnormal heart rhythms (arrhythmia).  Heart attack.  Allergic reactions to medicines or dyes.  Infection.  Kidney failure.  Failure of the artificial valve. What happens before the procedure? Medicines  Ask your health care provider about: ? Changing or stopping your regular medicines. This is especially important if you are taking diabetes medicines or blood thinners. ? Taking medicines such as aspirin and ibuprofen. These medicines can thin your blood. Do not take these medicines before your procedure if your health  care provider instructs you not to.  You may be given antibiotic medicine to help prevent infection.  You may need to take blood thinners before surgery. If so, take these medicines as directed. Staying hydrated Follow instructions from your health care provider about hydration, which may include:  Up to 2 hours before the procedure - you may continue to drink clear liquids, such as water, clear fruit juice, black coffee, and plain tea. Eating and drinking restrictions Follow instructions from your health care provider about eating and drinking, which may include:  8 hours before the procedure - stop eating heavy meals or foods such as meat, fried foods, or fatty foods.  6 hours before the procedure - stop eating light meals or foods, such as toast or cereal.  6 hours before the procedure - stop drinking milk or drinks that contain milk.  2 hours before the procedure - stop drinking clear liquids. General instructions  Do not use any products that contain nicotine or tobacco for as long as possible before your procedure. These include cigarettes and e-cigarettes. If you need help quitting, ask your health care provider.  You may have a blood or urine sample taken.  You may have tests, such as: ? Echocardiogram. This is an ultrasound scan of the heart. ? CT scan. ? Cardiac catheterization. During this procedure, a catheter is inserted into a blood vessel and guided into your heart to check pressures and take images of the heart. ? Lung function tests.  Plan to have someone take you home from the hospital. What happens during the procedure?  To lower your risk of infection: ? Your health care team will wash or sanitize their hands. ? Your skin will be washed with soap.  IV tubes will be inserted into veins in your arms.  You will be given one or more of the following: ? A medicine to help you relax (sedative). ? A medicine to numb the area (local anesthetic). ? A medicine to  make you fall asleep (general anesthetic).  Small incisions will be made in one or more of the following places: ? Your groin. This is the most common. ? Your neck. ? In your chest, over your breastbone (sternum). ? The left side of your chest.  Catheters will be threaded into your incisions and into blood vessels that lead to your heart. If your incision is in the left side of your chest, a catheter will be placed directly into your heart. Catheters will be used to monitor your heartbeat and regulate it if necessary (pacemaker).  Ongoing X-ray images (fluoroscopy) will be used to guide the catheters to the right places in your heart.  A wire may be placed through the catheter inside your aortic valve. A balloon at the end of this catheter may be inflated to open your aortic valve. This wire will then be removed.  Another wire will be placed through the catheter inside your aortic valve. The wire will be used to help position and inflate your artificial valve. Then, the wire will be removed.  Tests will be done to make sure your new valve is working and your heart is functioning properly.  Your incisions will  be closed with stitches (sutures), skin glue, or adhesive strips.  If you had an incision in your chest wall, you may have a chest tube placed to keep your lung from collapsing.  Bandages (dressings) will be placed over your incisions. The procedure may vary among health care providers and hospitals. What happens after the procedure?  Your blood pressure, heart rate, breathing rate, and blood oxygen level will be monitored.  You may have to wear compression stockings. These stockings help to prevent blood clots and reduce swelling in your legs.  You may need to lie still in bed for several hours. Once you are allowed to get up, you will be encouraged to move around.  If you had a chest incision, you will have some chest pain. You will be given pain medicine as needed.  You may  continue to receive fluids and medicines through an IV tube.  You will be given blood thinners.  You will be shown how to do breathing exercises to prevent lung infection.  You may continue to have a chest tube draining fluid from the surgical area.  Do not drive for 24 hours if you were given a sedative. Summary  Transcatheter aortic valve replacement (TAVR) is a procedure to place an artificial aortic valve in the heart.  You may need TAVR if you have a stiff aortic valve (aortic stenosis) that is causing symptoms and open heart valve replacement surgery is not an option for you.  After the procedure, you may need to lie still in bed for several hours. Once you are allowed to get up, you will be encouraged to move around. This information is not intended to replace advice given to you by your health care provider. Make sure you discuss any questions you have with your health care provider. Document Revised: 02/24/2019 Document Reviewed: 09/23/2016 Elsevier Patient Education  [place].

## 2020-07-31 ENCOUNTER — Ambulatory Visit (HOSPITAL_BASED_OUTPATIENT_CLINIC_OR_DEPARTMENT_OTHER): Payer: Medicare PPO

## 2020-08-25 ENCOUNTER — Ambulatory Visit (HOSPITAL_BASED_OUTPATIENT_CLINIC_OR_DEPARTMENT_OTHER)
Admission: RE | Admit: 2020-08-25 | Discharge: 2020-08-25 | Disposition: A | Discharge status: Home / Self Care | Payer: Medicare PPO | Source: Ambulatory Visit | Attending: Cardiology | Admitting: Cardiology

## 2020-08-25 DIAGNOSIS — I350 Nonrheumatic aortic (valve) stenosis: Secondary | ICD-10-CM | POA: Insufficient documentation

## 2020-08-26 LAB — ECHOCARDIOGRAM COMPLETE
AR max vel: 0.69 cm2
AV Area VTI: 0.68 cm2
AV Area mean vel: 0.59 cm2
AV Mean grad: 30.7 mmHg
AV Peak grad: 53.5 mmHg
Ao pk vel: 3.66 m/s
Area-P 1/2: 3.99 cm2
P 1/2 time: 477 msec
S' Lateral: 2.74 cm

## 2020-09-06 ENCOUNTER — Telehealth: Payer: Self-pay | Admitting: Cardiology

## 2020-09-06 NOTE — Telephone Encounter (Signed)
Mary from pt PCP office [facility] called in and would like to see if Dr Stanford Breed could get pt in soon then her appt in nov?  She stated she has had some syptomatic episode of sct .    Best number [phone] [facility] They as if we could please call pt with any change with the appt

## 2020-09-06 NOTE — Telephone Encounter (Signed)
Left message for the pt to call back... will forward to the [facility] and Dr. Jacalyn Lefevre nurse... according to her PCP pt is having reoccurring SVT and needs follow up.... her next appt is 10/04/20 with Dr. Stanford Breed at the [facility] office.

## 2020-09-07 NOTE — Telephone Encounter (Signed)
Spoke with pt, follow up rescheduled for the [facility] location.

## 2020-09-13 NOTE — Progress Notes (Signed)
HPI: FU paroxysmal supraventricular tachycardia, AS,hypercholesterolemia, and benign hypertensive heart disease.Nuclear study December 2013 showed ejection fraction 73% and normal perfusion.Echocardiogram February 2021 showed normal LV systolic function, mild left ventricular hypertrophy, grade 2 diastolic dysfunction, severe aortic stenosis with mean gradient 45 mmHg, trace aortic insufficiency and mild left atrial enlargement. Last echocardiogram October 2021 showed normal LV function, mild left ventricular hypertrophy, grade 2 diastolic dysfunction, mild mitral regurgitation and severe aortic stenosis (mean gradient 31 mmHg but felt likely underestimated due to severity noted on previous echocardiogram), mild aortic insufficiency.  Since last seen, she denies dyspnea, chest pain, palpitations or syncope.  Current Outpatient Medications  Medication Sig Dispense Refill  . ALPRAZolam (XANAX) 0.5 MG tablet Take 0.5 mg by mouth as directed.    [name] amLODipine (NORVASC) 2.5 MG tablet Take 2.5 mg by mouth at bedtime.    [name] amoxicillin (AMOXIL) 500 MG capsule Take 500 mg by mouth as directed. 4 capsules before dental procedures    . BIOTIN PO Take 1 tablet by mouth daily.     . bisoprolol (ZEBETA) 5 MG tablet TAKE 1 TABLET ONCE DAILY. (Patient taking differently: Take 5 mg by mouth daily. ) 90 tablet 3  . calcium-vitamin D (CALCIUM 500+D) 500-200 MG-UNIT per tablet Take 1 tablet by mouth daily with breakfast.    . ibuprofen (ADVIL) 200 MG tablet Take 400 mg by mouth every 6 (six) hours as needed. For pain    . loratadine (CLARITIN) 10 MG tablet Take 10 mg by mouth daily.    [name] losartan (COZAAR) 50 MG tablet Take 50 mg by mouth daily.    . rosuvastatin (CRESTOR) 5 MG tablet Take 5 mg by mouth 3 (three) times a week.     No current facility-administered medications for this visit.     Past Medical History:  Diagnosis Date  . Adenomatous colon polyp   . Aortic stenosis, mild   . Depression     Tx'd 19 yrs ago when husband passed away  . Diverticulosis   . Heart murmur   . Hyperlipidemia   . Hypertension   . Hypothyroidism    Dr. Dwyane Dee follows pt.--had small nodules but had decreased in size and stopped Synthroid 06/2013  . Osteopenia   . Osteoporosis   . Pure hypercholesterolemia   . SVT (supraventricular tachycardia) (HCC)     Past Surgical History:  Procedure Laterality Date  . CERVICAL BIOPSY  W/ LOOP ELECTRODE EXCISION  1996   CIN I  . COLONOSCOPY  03/2001, 01/2007   2008 - no polyps  . DILATION AND CURETTAGE OF UTERUS    . TONSILLECTOMY      Social History   Socioeconomic History  . Marital status: Widowed    Spouse name: Not on file  . Number of children: Not on file  . Years of education: Not on file  . Highest education level: Not on file  Occupational History  . Not on file  Tobacco Use  . Smoking status: Former Smoker    Quit date: 06/17/1972    Years since quitting: 48.2  . Smokeless tobacco: Never Used  . Tobacco comment: quit June 17, 1972  Substance and Sexual Activity  . Alcohol use: Yes    Alcohol/week: 0.0 standard drinks    Comment: 2 drinks per month  . Drug use: No  . Sexual activity: Never    Partners: Male    Birth control/protection: Post-menopausal  Other Topics Concern  . Not on file  Social History Narrative  . Not on file   Social Determinants of Health   Financial Resource Strain:   . Difficulty of Paying Living Expenses: Not on file  Food Insecurity:   . Worried About Charity fundraiser in the Last Year: Not on file  . Ran Out of Food in the Last Year: Not on file  Transportation Needs:   . Lack of Transportation (Medical): Not on file  . Lack of Transportation (Non-Medical): Not on file  Physical Activity:   . Days of Exercise per Week: Not on file  . Minutes of Exercise per Session: Not on file  Stress:   . Feeling of Stress : Not on file  Social Connections:   . Frequency of Communication with Friends and  Family: Not on file  . Frequency of Social Gatherings with Friends and Family: Not on file  . Attends Religious Services: Not on file  . Active Member of Clubs or Organizations: Not on file  . Attends Archivist Meetings: Not on file  . Marital Status: Not on file  Intimate Partner Violence:   . Fear of Current or Ex-Partner: Not on file  . Emotionally Abused: Not on file  . Physically Abused: Not on file  . Sexually Abused: Not on file    Family History  Problem Relation Age of Onset  . Stroke Sister   . Breast cancer Sister   . Breast cancer Daughter 57       metastatic breast ca    ROS: Residual right knee pain from recent accident but no fevers or chills, productive cough, hemoptysis, dysphasia, odynophagia, melena, hematochezia, dysuria, hematuria, rash, seizure activity, orthopnea, PND, pedal edema, claudication. Remaining systems are negative.  Physical Exam: Well-developed well-nourished in no acute distress.  Skin is warm and dry.  HEENT is normal.  Neck is supple.  Chest is clear to auscultation with normal expansion.  Cardiovascular exam is regular rate and rhythm.  3/6 systolic murmur left sternal border. Abdominal exam nontender or distended. No masses palpated. Extremities show no edema. neuro grossly intact  ECG-sinus rhythm at a rate of 51, no ST changes.  Cannot rule out septal infarct.  First-degree AV block.  Personally reviewed  A/P  1 severe aortic stenosis-patient has severe aortic stenosis.  At last office visit she was noting increased dyspnea on exertion and I recommended proceeding with TAVR evaluation.  She was hesitant.  Today she feels as though her symptoms are unchanged.  She feels as though she can do all activities that she desires to do without having dyspnea or chest pain.  There is no history of syncope.  We will therefore continue to follow for now.  I will see her back in 3 months.  She understands that she will likely require TAVR  in the future.  2 supraventricular tachycardia-patient was seen by primary care for palpitations and beta-blocker increased.  3 hypertension-blood pressure controlled.  Continue present medical regimen.  4 hyperlipidemia-continue statin.  Kirk Ruths, MD

## 2020-09-19 ENCOUNTER — Encounter: Payer: Self-pay | Admitting: Cardiology

## 2020-09-19 ENCOUNTER — Ambulatory Visit (INDEPENDENT_AMBULATORY_CARE_PROVIDER_SITE_OTHER): Payer: Medicare PPO | Admitting: Cardiology

## 2020-09-19 ENCOUNTER — Other Ambulatory Visit: Payer: Self-pay

## 2020-09-19 VITALS — BP 132/48 | HR 51 | Temp 97.3°F | Ht 66.0 in | Wt 159.4 lb

## 2020-09-19 DIAGNOSIS — I10 Essential (primary) hypertension: Secondary | ICD-10-CM | POA: Diagnosis not present

## 2020-09-19 DIAGNOSIS — I350 Nonrheumatic aortic (valve) stenosis: Secondary | ICD-10-CM | POA: Diagnosis not present

## 2020-09-19 DIAGNOSIS — I471 Supraventricular tachycardia: Secondary | ICD-10-CM

## 2020-09-19 NOTE — Patient Instructions (Addendum)
  Follow-Up: At [facility], you and your health needs are our priority.  As part of our continuing mission to provide you with exceptional heart care, we have created designated Provider Care Teams.  These Care Teams include your primary Cardiologist (physician) and Advanced Practice Providers (APPs -  Physician Assistants and Nurse Practitioners) who all work together to provide you with the care you need, when you need it.  We recommend signing up for the patient portal called "MyChart".  Sign up information is provided on this After Visit Summary.  MyChart is used to connect with patients for Virtual Visits (Telemedicine).  Patients are able to view lab/test results, encounter notes, upcoming appointments, etc.  Non-urgent messages can be sent to your provider as well.   To learn more about what you can do with MyChart, go to [email or web address].    Your next appointment:   3 month(s)  The format for your next appointment:   In Person  Provider:   Brian Crenshaw, MD    

## 2020-10-04 ENCOUNTER — Ambulatory Visit: Payer: Medicare PPO | Admitting: Cardiology

## 2020-10-26 ENCOUNTER — Other Ambulatory Visit: Payer: Self-pay

## 2020-10-26 ENCOUNTER — Observation Stay (HOSPITAL_BASED_OUTPATIENT_CLINIC_OR_DEPARTMENT_OTHER)
Admission: EM | Admit: 2020-10-26 | Discharge: 2020-10-27 | Disposition: A | Discharge status: Home / Self Care | Payer: Medicare PPO | Source: Home / Self Care | Attending: Emergency Medicine | Admitting: Emergency Medicine

## 2020-10-26 ENCOUNTER — Encounter (HOSPITAL_COMMUNITY): Payer: Self-pay | Admitting: Emergency Medicine

## 2020-10-26 DIAGNOSIS — Z20822 Contact with and (suspected) exposure to covid-19: Secondary | ICD-10-CM | POA: Insufficient documentation

## 2020-10-26 DIAGNOSIS — I350 Nonrheumatic aortic (valve) stenosis: Secondary | ICD-10-CM

## 2020-10-26 DIAGNOSIS — E039 Hypothyroidism, unspecified: Secondary | ICD-10-CM | POA: Insufficient documentation

## 2020-10-26 DIAGNOSIS — I471 Supraventricular tachycardia, unspecified: Secondary | ICD-10-CM | POA: Diagnosis present

## 2020-10-26 DIAGNOSIS — I119 Hypertensive heart disease without heart failure: Secondary | ICD-10-CM | POA: Diagnosis present

## 2020-10-26 DIAGNOSIS — E7800 Pure hypercholesterolemia, unspecified: Secondary | ICD-10-CM | POA: Diagnosis present

## 2020-10-26 DIAGNOSIS — Z87891 Personal history of nicotine dependence: Secondary | ICD-10-CM | POA: Insufficient documentation

## 2020-10-26 DIAGNOSIS — R002 Palpitations: Secondary | ICD-10-CM | POA: Diagnosis not present

## 2020-10-26 DIAGNOSIS — Z79899 Other long term (current) drug therapy: Secondary | ICD-10-CM | POA: Insufficient documentation

## 2020-10-26 LAB — COMPREHENSIVE METABOLIC PANEL
ALT: 15 U/L (ref 0–44)
AST: 18 U/L (ref 15–41)
Albumin: 3.6 g/dL (ref 3.5–5.0)
Alkaline Phosphatase: 68 U/L (ref 38–126)
Anion gap: 10 (ref 5–15)
BUN: 13 mg/dL (ref 8–23)
CO2: 23 mmol/L (ref 22–32)
Calcium: 9.1 mg/dL (ref 8.9–10.3)
Chloride: 105 mmol/L (ref 98–111)
Creatinine, Ser: 0.87 mg/dL (ref 0.44–1.00)
GFR, Estimated: >60 mL/min (ref >60)
Glucose, Bld: 95 mg/dL (ref 70–99)
Potassium: 4.4 mmol/L (ref 3.5–5.1)
Sodium: 138 mmol/L (ref 135–145)
Total Bilirubin: 0.5 mg/dL (ref 0.3–1.2)
Total Protein: 6.2 g/dL — ABNORMAL LOW (ref 6.5–8.1)

## 2020-10-26 LAB — CBC WITH DIFFERENTIAL/PLATELET
Abs Immature Granulocytes: 0.02 10*3/uL (ref 0.00–0.07)
Basophils Absolute: 0.1 10*3/uL (ref 0.0–0.1)
Basophils Relative: 1 %
Eosinophils Absolute: 0.1 10*3/uL (ref 0.0–0.5)
Eosinophils Relative: 1 %
HCT: 44 % (ref 36.0–46.0)
Hemoglobin: 13.7 g/dL (ref 12.0–15.0)
Immature Granulocytes: 0 %
Lymphocytes Relative: 23 %
Lymphs Abs: 2 10*3/uL (ref 0.7–4.0)
MCH: 28.2 pg (ref 26.0–34.0)
MCHC: 31.1 g/dL (ref 30.0–36.0)
MCV: 90.7 fL (ref 80.0–100.0)
Monocytes Absolute: 0.8 10*3/uL (ref 0.1–1.0)
Monocytes Relative: 10 %
Neutro Abs: 5.5 10*3/uL (ref 1.7–7.7)
Neutrophils Relative %: 65 %
Platelets: 253 10*3/uL (ref 150–400)
RBC: 4.85 MIL/uL (ref 3.87–5.11)
RDW: 13.6 % (ref 11.5–15.5)
WBC: 8.5 10*3/uL (ref 4.0–10.5)
nRBC: 0 % (ref 0.0–0.2)

## 2020-10-26 LAB — RESP PANEL BY RT-PCR (FLU A&B, COVID) ARPGX2
Influenza A by PCR: NEGATIVE
Influenza B by PCR: NEGATIVE
SARS Coronavirus 2 by RT PCR: NEGATIVE

## 2020-10-26 LAB — TROPONIN I (HIGH SENSITIVITY): Troponin I (High Sensitivity): 184 ng/L (ref <18)

## 2020-10-26 MED ORDER — ROSUVASTATIN CALCIUM 5 MG PO TABS: 5.0000 mg | ORAL_TABLET | ORAL | Status: DC

## 2020-10-26 MED ORDER — CALCIUM CARBONATE-VITAMIN D 500-200 MG-UNIT PO TABS
1.0000 | ORAL_TABLET | Freq: Every day | ORAL | Status: DC
  Administered 2020-10-27: 08:00:00 1 via ORAL
  Filled 2020-10-26: qty 1

## 2020-10-26 MED ORDER — METOPROLOL TARTRATE 5 MG/5ML IV SOLN
5.0000 mg | Freq: Once | INTRAVENOUS | Status: AC
  Administered 2020-10-26: 5 mg via INTRAVENOUS
  Filled 2020-10-26: qty 5

## 2020-10-26 MED ORDER — BIOTIN 2.5 MG PO TABS: 1.0000 | ORAL_TABLET | Freq: Every day | ORAL | Status: DC

## 2020-10-26 MED ORDER — PINDOLOL 5 MG PO TABS
5.0000 mg | ORAL_TABLET | Freq: Two times a day (BID) | ORAL | Status: DC
  Administered 2020-10-26 – 2020-10-27 (×2): 5 mg via ORAL
  Filled 2020-10-26 (×4): qty 1

## 2020-10-26 MED ORDER — IBUPROFEN 400 MG PO TABS: 400.0000 mg | ORAL_TABLET | Freq: Four times a day (QID) | ORAL | Status: DC | PRN

## 2020-10-26 MED ORDER — LORATADINE 10 MG PO TABS
10.0000 mg | ORAL_TABLET | Freq: Every day | ORAL | Status: DC
  Administered 2020-10-27: 10 mg via ORAL
  Filled 2020-10-26: qty 1

## 2020-10-26 MED ORDER — PINDOLOL 5 MG PO TABS: 5.0000 mg | ORAL_TABLET | Freq: Two times a day (BID) | ORAL | Status: DC

## 2020-10-26 NOTE — ED Notes (Signed)
Son (brian) lives out of town would like an update [phone]

## 2020-10-26 NOTE — ED Notes (Signed)
Pt to bathroom via wheelchair without any problems 

## 2020-10-26 NOTE — H&P (Signed)
See consult note from today Kirk Ruths

## 2020-10-26 NOTE — ED Triage Notes (Signed)
Pt to ED via [facility] from [facility].  Pt st's she felt her heart racing earlier today and went to the clinic at [facility] where they called EMS.  EMS reports pt was in SVT they gave Adenosine 6mg  IV and pt converted but on the way to ED pt went back into SVT.  EMS gave Adenosine 12mg  IV and pt converted.  On arrival to ED pt denies any chest pain or discomfort  St's she just feels a little anxious that her heart may start racing again

## 2020-10-26 NOTE — ED Notes (Signed)
Dinner Ordered @ [phone].

## 2020-10-26 NOTE — ED Notes (Signed)
Pt sitting on side of stretcher eating dinner

## 2020-10-26 NOTE — ED Notes (Signed)
EMS gave pt 1042ml of NS enroute

## 2020-10-26 NOTE — ED Notes (Signed)
Dinner tray ordered.

## 2020-10-26 NOTE — ED Provider Notes (Signed)
[facility] EMERGENCY DEPARTMENT Provider Note   CSN: [id number] Arrival date & time: 10/26/20  1615     History Chief Complaint  Patient presents with  . SVT    Kim Ward is a 86 y.o. female.  HPI  Pleasant 86 year old female, history of SVT, followed by Dr. Stanford Breed with cardiology, she has had an echocardiogram in February 2021 which showed normal left ventricular systolic function, mild LVH and grade 2 diastolic dysfunction.  She had severe aortic stenosis.  She presents today stating that she did not feel right in her chest like she or had heart racing.  She was seen by her physician assistant at the clinic at the facility where she lives who found her to be in SVT and call for an ambulance.  The ambulance did confirm that the patient was in SVT and gave her 6 mg of adenosine with successful conversion to normal sinus rhythm.  Short time later while they were in the ambulance she had another episode of SVT up to 180 bpm and required 12 mg of adenosine.  At this time the patient is feeling well, she has no other complaints and had no complaints prior to this occurring.  She does not have any chest pain shortness of breath nausea vomiting diarrhea fevers chills weakness lightheadedness headache blurred vision numbness or weakness  I have reviewed the patient's medications, she takes amlodipine, bisoprolol, losartan and rosuvastatin.  She is not anticoagulated  I have viewed the paramedics prehospital cardiac tracings which showed SVT with some variable morphology followed by adenosine with long asystolic pauses followed by normal sinus rhythm.  There did not appear to be any underlying atrial fibrillation or flutter on these cardiac monitoring strips.  Past Medical History:  Diagnosis Date  . Adenomatous colon polyp   . Aortic stenosis, mild   . Depression    Tx'd 19 yrs ago when husband passed away  . Diverticulosis   . Heart murmur   . Hyperlipidemia   .  Hypertension   . Hypothyroidism    Dr. Dwyane Dee follows pt.--had small nodules but had decreased in size and stopped Synthroid 06/2013  . Osteopenia   . Osteoporosis   . Pure hypercholesterolemia   . SVT (supraventricular tachycardia) [facility])     Patient Active Problem List   Diagnosis Date Noted  . Hypertensive cardiovascular disease 06/26/2016  . Dizziness 06/26/2016  . Low back pain 07/26/2015  . Constipation 03/01/2014  . Alopecia 09/29/2013  . Multinodular goiter 07/22/2013  . Benign hypertensive heart disease without heart failure 03/08/2013  . Elevated troponin 10/10/2012  . SVT (supraventricular tachycardia) ([place]) 12/31/2011  . Hypercholesteremia 02/11/2011  . Hypothyroid 02/11/2011  . Aortic stenosis, mild 02/11/2011    Past Surgical History:  Procedure Laterality Date  . CERVICAL BIOPSY  W/ LOOP ELECTRODE EXCISION  1996   CIN I  . COLONOSCOPY  03/2001, 01/2007   2008 - no polyps  . DILATION AND CURETTAGE OF UTERUS    . TONSILLECTOMY       OB History    Gravida  5   Para  3   Term      Preterm      AB      Living  3     SAB      IAB      Ectopic      Multiple      Live Births  Family History  Problem Relation Age of Onset  . Stroke Sister   . Breast cancer Sister   . Breast cancer Daughter 21       metastatic breast ca    Social History   Tobacco Use  . Smoking status: Former Smoker    Quit date: 06/17/1972    Years since quitting: 48.3  . Smokeless tobacco: Never Used  . Tobacco comment: quit June 17, 1972  Substance Use Topics  . Alcohol use: Yes    Alcohol/week: 0.0 standard drinks    Comment: 2 drinks per month  . Drug use: No    Home Medications Prior to Admission medications   Medication Sig Start Date End Date Taking? Authorizing Provider  ALPRAZolam Duanne Moron) 0.5 MG tablet Take 0.5 mg by mouth as directed. 06/06/20   [provider]  amLODipine (NORVASC) 2.5 MG tablet Take 2.5 mg by mouth at  bedtime. 06/21/20   [provider]  amoxicillin (AMOXIL) 500 MG capsule Take 500 mg by mouth as directed. 4 capsules before dental procedures    [provider]  BIOTIN PO Take 1 tablet by mouth daily.     [provider]  bisoprolol (ZEBETA) 5 MG tablet TAKE 1 TABLET ONCE DAILY. Patient taking differently: Take 5 mg by mouth daily.  03/29/19   Lelon Perla, MD  calcium-vitamin D (CALCIUM 500+D) 500-200 MG-UNIT per tablet Take 1 tablet by mouth daily with breakfast.    [provider]  ibuprofen (ADVIL) 200 MG tablet Take 400 mg by mouth every 6 (six) hours as needed. For pain    [provider]  loratadine (CLARITIN) 10 MG tablet Take 10 mg by mouth daily.    [provider]  losartan (COZAAR) 50 MG tablet Take 50 mg by mouth daily.    [provider]  rosuvastatin (CRESTOR) 5 MG tablet Take 5 mg by mouth 3 (three) times a week. 02/21/20   [provider]    Allergies    Augmentin [amoxicillin-pot clavulanate], Azithromycin, Bextra [valdecoxib], Cefuroxime, Cefuroxime axetil, Humibid la [guaifenesin], Lipitor [atorvastatin calcium], Ramipril, Risedronate sodium, Tramadol, Zocor [simvastatin], and Niaspan [niacin er]  Review of Systems   Review of Systems  All other systems reviewed and are negative.   Physical Exam Updated Vital Signs LMP 11/19/1983   Physical Exam Vitals and nursing note reviewed.  Constitutional:      General: She is not in acute distress.    Appearance: She is well-developed and well-nourished.  HENT:     Head: Normocephalic and atraumatic.     Mouth/Throat:     Mouth: Oropharynx is clear and moist.     Pharynx: No oropharyngeal exudate.  Eyes:     General: No scleral icterus.       Right eye: No discharge.        Left eye: No discharge.     Extraocular Movements: EOM normal.     Conjunctiva/sclera: Conjunctivae normal.     Pupils: Pupils are equal, round, and reactive to light.   Neck:     Thyroid: No thyromegaly.     Vascular: No JVD.  Cardiovascular:     Rate and Rhythm: Normal rate and regular rhythm.     Pulses: Intact distal pulses.     Heart sounds: Murmur heard.  No friction rub. No gallop.   Pulmonary:     Effort: Pulmonary effort is normal. No respiratory distress.     Breath sounds: Normal breath sounds. No wheezing or  rales.  Abdominal:     General: Bowel sounds are normal. There is no distension.     Palpations: Abdomen is soft. There is no mass.     Tenderness: There is no abdominal tenderness.  Musculoskeletal:        General: No tenderness or edema. Normal range of motion.     Cervical back: Normal range of motion and neck supple.  Lymphadenopathy:     Cervical: No cervical adenopathy.  Skin:    General: Skin is warm and dry.     Findings: No erythema or rash.  Neurological:     Mental Status: She is alert.     Coordination: Coordination normal.  Psychiatric:        Mood and Affect: Mood and affect normal.        Behavior: Behavior normal.     ED Results / Procedures / Treatments   Labs (all labs ordered are listed, but only abnormal results are displayed) Labs Reviewed  CBC WITH DIFFERENTIAL/PLATELET  COMPREHENSIVE METABOLIC PANEL  TROPONIN I (HIGH SENSITIVITY)    EKG EKG Interpretation  Date/Time:  Thursday October 26 2020 16:41:14 EST Ventricular Rate:  90 PR Interval:    QRS Duration: 86 QT Interval:  332 QTC Calculation: 407 R Axis:   30 Text Interpretation: Sinus rhythm Prolonged PR interval since last tracing no significant change Confirmed by Noemi Chapel [phone]) on 10/26/2020 6:37:33 PM   EKG Interpretation  Date/Time:  Thursday October 26 2020 17:10:20 EST Ventricular Rate:  155 PR Interval:    QRS Duration: 123 QT Interval:  306 QTC Calculation: 492 R Axis:   67 Text Interpretation: Wide-QRS tachycardia Right bundle branch block SVT likely. Since last tracing rate faster Confirmed by Noemi Chapel  [phone]) on 10/26/2020 6:39:12 PM        Radiology No results found.  Procedures .Critical Care Performed by: Noemi Chapel, MD Authorized by: Noemi Chapel, MD   Critical care provider statement:    Critical care time (minutes):  35   Critical care time was exclusive of:  Separately billable procedures and treating other patients and teaching time   Critical care was necessary to treat or prevent imminent or life-threatening deterioration of the following conditions:  Cardiac failure   Critical care was time spent personally by me on the following activities:  Blood draw for specimens, development of treatment plan with patient or surrogate, discussions with consultants, evaluation of patient's response to treatment, examination of patient, obtaining history from patient or surrogate, ordering and performing treatments and interventions, ordering and review of laboratory studies, ordering and review of radiographic studies, pulse oximetry, re-evaluation of patient's condition and review of old charts   (including critical care time)  Medications Ordered in ED Medications  pindolol (VISKEN) tablet 5 mg (has no administration in time range)  metoprolol tartrate (LOPRESSOR) injection 5 mg (5 mg Intravenous Given 10/26/20 1722)    ED Course  I have reviewed the triage vital signs and the nursing notes.  Pertinent labs & imaging results that were available during my care of the patient were reviewed by me and considered in my medical decision making (see chart for details).  Clinical Course as of 10/26/20 1751  Thu Oct 26, 2020  1734 I discussed the care with cardiology who will come see the patient in consultation.  After giving 5 mg of Lopressor the patient converted back into normal sinus rhythm again.  Unfortunately her baseline EKG and heart rate is around 50 which may  limit the amount of beta-blocker she can have [BM]    Clinical Course User Index [BM] Noemi Chapel, MD   MDM  Rules/Calculators/A&P                          Other than having a heart murmur the patient's cardiac exam is unremarkable.  She does have a borderline tachycardia but strong pulses no edema, no JVD and clear lung sounds.  I will discussed the case with cardiology, she will need a small amount of beta-blocker if her blood pressure will allow, she is otherwise stable appearing at this time.  She has not had any provocative testing since 2013, I do not see a heart catheterization on the list of procedures  Pre hospital EKG with conversion with adenosine below:      I discussed the case with the cardiologist who has been kind enough to come see the patient in consultation and request admission overnight for observation.  I think this is reasonable given the patient's age, comorbidities, recurrent SVT and atrial arrhythmias.  She has required 2 doses of adenosine and a dose of IV beta-blocker to help keep her rate in a reasonable range.  Thankfully she does not appear to be in congestive heart failure  Final Clinical Impression(s) / ED Diagnoses Final diagnoses:  SVT (supraventricular tachycardia) (HCC)      Noemi Chapel, MD 10/26/20 1840

## 2020-10-26 NOTE — Consult Note (Signed)
Cardiology Consultation:   Patient ID: Kim Ward; [id number]; 1930-12-15   Admit date: 10/26/2020 Date of Consult: 10/26/2020  Primary Care Provider: Linus Mako, NP Primary Cardiologist: Kim Ruths, MD 09/19/2020 Primary Electrophysiologist:  None   Patient Profile:   Kim Ward is a 86 y.o. female with a hx of PSVT, HTN, HLD, hypothyroid, severe AS, who is being seen today for the evaluation of SVT at the request of Kim Ward.  History of Present Illness:   Kim Ward was in her USOH this am when she had sudden onset of heart racing. She did not get SOB with this. No chest pain. She was not light-headed or dizzy.   No unusual activity or stress. No OTC meds, drinks decaf coffee and tea at baseline.   Her neighbor came by and took her to the clinic >> ER by EMS.   EMS gave her adenosine 6 mg and 12 mg, but SVT recurred.   In the ER, she was given metoprolol 5 mg IV and the SVT has resolved.   She is currently resting comfortably, in SR.  This is the first time her heart has done this in years.   She has been doing well in general.   Past Medical History:  Diagnosis Date  . Adenomatous colon polyp   . Aortic stenosis   . Depression    Tx'd 19 yrs ago when husband passed away  . Diverticulosis   . Heart murmur   . Hyperlipidemia   . Hypertension   . Hypothyroidism    Kim Ward follows pt.--had small nodules but had decreased in size and stopped Synthroid 06/2013  . Osteopenia   . Osteoporosis   . Pure hypercholesterolemia   . SVT (supraventricular tachycardia) (HCC)     Past Surgical History:  Procedure Laterality Date  . CERVICAL BIOPSY  W/ LOOP ELECTRODE EXCISION  1996   CIN I  . COLONOSCOPY  03/2001, 01/2007   2008 - no polyps  . DILATION AND CURETTAGE OF UTERUS    . TONSILLECTOMY       Prior to Admission medications   Medication Sig Start Date End Date Taking? Authorizing Provider  ALPRAZolam Duanne Moron) 0.5 MG tablet Take 0.5 mg by mouth as  directed. 06/06/20   [provider]  amLODipine (NORVASC) 2.5 MG tablet Take 2.5 mg by mouth at bedtime. 06/21/20   [provider]  amoxicillin (AMOXIL) 500 MG capsule Take 500 mg by mouth as directed. 4 capsules before dental procedures    [provider]  BIOTIN PO Take 1 tablet by mouth daily.     [provider]  bisoprolol (ZEBETA) 5 MG tablet TAKE 1 TABLET ONCE DAILY. Patient taking differently: Take 5 mg by mouth daily.  03/29/19   Kim Perla, MD  calcium-vitamin D (CALCIUM 500+D) 500-200 MG-UNIT per tablet Take 1 tablet by mouth daily with breakfast.    [provider]  ibuprofen (ADVIL) 200 MG tablet Take 400 mg by mouth every 6 (six) hours as needed. For pain    [provider]  loratadine (CLARITIN) 10 MG tablet Take 10 mg by mouth daily.    [provider]  losartan (COZAAR) 50 MG tablet Take 50 mg by mouth daily.    [provider]  rosuvastatin (CRESTOR) 5 MG tablet Take 5 mg by mouth 3 (three) times a week. 02/21/20   [provider]    Inpatient Medications: Scheduled Meds:  Continuous Infusions:  PRN Meds:  Allergies:    Allergies  Allergen Reactions  . Augmentin [Amoxicillin-Pot Clavulanate]   . Azithromycin     Or any drug that ends in "mycin"  . Bextra [Valdecoxib]     Unknown  . Cefuroxime Other (See Comments)    dizziness  . Cefuroxime Axetil     Severe dizziness  . Humibid La [Guaifenesin] Other (See Comments)    Mouth sores  . Lipitor [Atorvastatin Calcium]     MYALGIAS  . Ramipril Cough  . Risedronate Sodium      GI ISSUES  . Tramadol Other (See Comments)    Vomiting and diarrhea  . Zocor [Simvastatin]     MYALGIAS  . Niaspan [Niacin Er] Rash    Social History:   Social History   Socioeconomic History  . Marital status: Widowed    Spouse name: Not on file  . Number of children: Not on file  . Years of education: Not on file  . Highest education level:  Not on file  Occupational History  . Not on file  Tobacco Use  . Smoking status: Former Smoker    Quit date: 06/17/1972    Years since quitting: 48.3  . Smokeless tobacco: Never Used  . Tobacco comment: quit June 17, 1972  Substance and Sexual Activity  . Alcohol use: Yes    Alcohol/week: 0.0 standard drinks    Comment: 2 drinks per month  . Drug use: No  . Sexual activity: Never    Partners: Male    Birth control/protection: Post-menopausal  Other Topics Concern  . Not on file  Social History Narrative  . Not on file   Social Determinants of Health   Financial Resource Strain: Not on file  Food Insecurity: Not on file  Transportation Needs: Not on file  Physical Activity: Not on file  Stress: Not on file  Social Connections: Not on file  Intimate Partner Violence: Not on file    Family History:   Family History  Problem Relation Age of Onset  . Stroke Sister   . Breast cancer Sister   . Breast cancer Daughter 51       metastatic breast ca   Family Status:  Family Status  Relation Name Status  . Sister 4 Deceased  . Mother  Deceased  . Father  Deceased       [employer]  . Brother 2 Alive  . Daughter  (Not Specified)  . MGM  Deceased  . MGF  Deceased  . PGM  Deceased  . PGF  Deceased    ROS:  Please see the history of present illness. No fevers, chills, productive cough or hemoptysis. All other ROS reviewed and negative.     Physical Exam/Data:   Vitals:   10/26/20 1641 10/26/20 1645 10/26/20 1715  BP: (!) 141/67 135/61 121/69  Pulse: 96 94 (!) 150  Resp: 15 14 18   Temp: 98 F (36.7 C)    TempSrc: Oral    SpO2: 99% 96% 93%   No intake or output data in the 24 hours ending 10/26/20 1815  Last 3 Weights 09/19/2020 07/05/2020 06/03/2020  Weight (lbs) 159 lb 6.4 oz 162 lb 6.4 oz 155 lb  Weight (kg) 72.303 kg 73.664 kg 70.308 kg     There is no height or weight on file to calculate BMI.   General:  Well nourished, well developed, female in no  acute distress HEENT: normal Lymph: no adenopathy Neck: JVD - not elevated Endocrine:  No thryomegaly  Vascular: No carotid bruits; 4/4 extremity pulses 2+  Cardiac:  normal S1, S2; RRR; 2/6 systolic murmur; S2 diminished Lungs:  clear bilaterally, no wheezing, rhonchi or rales  Abd: soft, nontender, no hepatomegaly  Ext: no edema Musculoskeletal:  No deformities, BUE and BLE strength normal and equal Skin: warm and dry  Neuro:  CNs 2-12 intact, no focal abnormalities noted Psych:  Normal affect   EKG:  The EKG was personally reviewed and demonstrates:  SVT, RBBB, HR 155 Telemetry:  Telemetry was personally reviewed and demonstrates:  SVT >> SR   CV studies:   ECHO: 08/25/2020 1. Left ventricular ejection fraction, by estimation, is 60 to 65%. The  left ventricle has normal function. The left ventricle has no regional  wall motion abnormalities. There is mild left ventricular hypertrophy.  Left ventricular diastolic parameters  are consistent with Grade II diastolic dysfunction (pseudonormalization).  2. Right ventricular systolic function is normal. The right ventricular  size is normal.  3. The mitral valve is normal in structure. Mild mitral valve  regurgitation. No evidence of mitral stenosis.  4. Gradient is less this time as compare to echocardiogram from  12/30/2019. Most likely missed higher velocity jet of AS at this study. All other parameters indicate severe AS. The aortic valve is normal in  structure. Aortic valve regurgitation is mild.  Severe aortic valve stenosis. Aortic valve mean gradient measures 30.7 mmHg.  5. The inferior vena cava is normal in size with greater than 50%  respiratory variability, suggesting right atrial pressure of 3 mmHg.   Laboratory Data:    Lab Results  Component Value Date   ALT 14 02/07/2020   AST 21 02/07/2020   ALKPHOS 54 02/07/2020   BILITOT 0.7 02/07/2020   Hematology Recent Labs  Lab 10/26/20 1628  WBC 8.5  RBC  4.85  HGB 13.7  HCT 44.0  MCV 90.7  MCH 28.2  MCHC 31.1  RDW 13.6  PLT 253   TSH:  Lab Results  Component Value Date   TSH 2.45 06/24/2016   Lipids: Lab Results  Component Value Date   CHOL 153 06/24/2016   HDL 55 06/24/2016   LDLCALC 67 06/24/2016   LDLDIRECT 140.2 06/24/2014   TRIG 156 (H) 06/24/2016   CHOLHDL 2.8 06/24/2016    Assessment and Plan:   1. SVT: - Spontaneously occurred w/out any obvious triggers.    Principal Problem:   SVT (supraventricular tachycardia) ([place])     For questions or updates, please contact [place] Please consult [email or web address] for contact info under Cardiology/STEMI.   Signed, Kim Ruths, MD  10/26/2020 6:15 PM As above, patient seen and examined.  Briefly she is an 86 year old female with past medical history of supraventricular tachycardia, hypothyroidism with supraventricular tachycardia. Last echocardiogram October 2021 showed normal LV function, mild left ventricular hypertrophy, grade 2 diastolic dysfunction, mild mitral regurgitation, severe aortic stenosis and mild aortic insufficiency.  Patient's aortic stenosis is being followed as she is asymptomatic.  She typically does not have dyspnea on exertion, orthopnea, PND, pedal edema or syncope.  This morning she developed sudden onset of palpitations similar to her previous SVT though she has not had bouts of this in several years.  Her symptoms would not resolve and EMS was called.  She was treated with adenosine 6 mg IV which converted her to sinus rhythm.  She then had another episode with heart rate up to 180 bpm and received 12 mg of adenosine which again converted the patient to  sinus rhythm.  Note with her SVT she had only palpitations.  There was no chest pain, dyspnea or syncope.  She now feels well.  Cardiology asked to evaluate.  Note electrocardiogram September 19, 2020 at time of last office visit showed sinus bradycardia at a heart rate of 51 and first-degree AV  block. Hemoglobin 13.7.  Initial electrocardiogram appears to show supraventricular tachycardia with aberrant conduction at times.  Follow-up ECG shows sinus rhythm with first-degree AV block.  Review of patient's rhythm strips shows probable SVT at a rate of 155.  No flutter waves are noted when patient was given adenosine and it did cause conversion to sinus rhythm.  1 supraventricular tachycardia-patient presented with recurrent supraventricular tachycardia.  She received adenosine 6 mg IV and converted to sinus rhythm but then had another episode requiring 12 mg IV.  She was then given 5 mg of IV metoprolol and has had no recurrences.  Given age I would like to avoid ablation if possible.  Note this has been her first recurrence in several years.  Also note she has a baseline first-degree AV block and we need to be careful advancing beta-blockade.  I will discontinue bisoprolol and instead treat with pindolol 5 mg twice daily.  Given age and baseline severe aortic stenosis as well as recurrence of atrial arrhythmias I would like to observe on telemetry overnight.  Follow for worsening bradycardia on higher doses of beta-blockade.  Note LV function is normal.  If she has recurrences despite higher doses of beta-blockade would need to consider referral to electrophysiology for ablation.  2 severe aortic stenosis-patient has not had symptoms including no dyspnea, chest pain or syncope.  This will be followed.  She understands she may require TAVR in the future.  3 hypertension-given that we are increasing beta-blockade for SVT we will discontinue amlodipine and follow blood pressure.  4 hyperlipidemia-continue statin.  Kim Ruths, MD

## 2020-10-27 ENCOUNTER — Other Ambulatory Visit: Payer: Self-pay

## 2020-10-27 DIAGNOSIS — I471 Supraventricular tachycardia: Secondary | ICD-10-CM | POA: Diagnosis not present

## 2020-10-27 LAB — BASIC METABOLIC PANEL
Anion gap: 9 (ref 5–15)
BUN: 14 mg/dL (ref 8–23)
CO2: 25 mmol/L (ref 22–32)
Calcium: 8.8 mg/dL — ABNORMAL LOW (ref 8.9–10.3)
Chloride: 106 mmol/L (ref 98–111)
Creatinine, Ser: 0.87 mg/dL (ref 0.44–1.00)
GFR, Estimated: >60 mL/min (ref >60)
Glucose, Bld: 108 mg/dL — ABNORMAL HIGH (ref 70–99)
Potassium: 3.9 mmol/L (ref 3.5–5.1)
Sodium: 140 mmol/L (ref 135–145)

## 2020-10-27 LAB — CBC
HCT: 37.9 % (ref 36.0–46.0)
Hemoglobin: 12.4 g/dL (ref 12.0–15.0)
MCH: 29 pg (ref 26.0–34.0)
MCHC: 32.7 g/dL (ref 30.0–36.0)
MCV: 88.8 fL (ref 80.0–100.0)
Platelets: 228 10*3/uL (ref 150–400)
RBC: 4.27 MIL/uL (ref 3.87–5.11)
RDW: 13.7 % (ref 11.5–15.5)
WBC: 8.5 10*3/uL (ref 4.0–10.5)
nRBC: 0 % (ref 0.0–0.2)

## 2020-10-27 LAB — TROPONIN I (HIGH SENSITIVITY): Troponin I (High Sensitivity): 115 ng/L (ref <18)

## 2020-10-27 MED ORDER — ONDANSETRON HCL 4 MG/2ML IJ SOLN: 4.0000 mg | Freq: Four times a day (QID) | INTRAMUSCULAR | Status: DC | PRN

## 2020-10-27 MED ORDER — ENOXAPARIN SODIUM 40 MG/0.4ML ~~LOC~~ SOLN: 40.0000 mg | SUBCUTANEOUS | Status: DC

## 2020-10-27 MED ORDER — NITROGLYCERIN 0.4 MG SL SUBL: 0.4000 mg | SUBLINGUAL_TABLET | SUBLINGUAL | Status: DC | PRN

## 2020-10-27 MED ORDER — SODIUM CHLORIDE 0.9% FLUSH: 3.0000 mL | INTRAVENOUS | Status: DC | PRN

## 2020-10-27 MED ORDER — ACETAMINOPHEN 325 MG PO TABS: 650.0000 mg | ORAL_TABLET | ORAL | Status: DC | PRN

## 2020-10-27 MED ORDER — SODIUM CHLORIDE 0.9 % IV SOLN: 250.0000 mL | INTRAVENOUS | Status: DC | PRN

## 2020-10-27 MED ORDER — ZOLPIDEM TARTRATE 5 MG PO TABS: 5.0000 mg | ORAL_TABLET | Freq: Every evening | ORAL | Status: DC | PRN

## 2020-10-27 MED ORDER — SODIUM CHLORIDE 0.9% FLUSH: 3.0000 mL | Freq: Two times a day (BID) | INTRAVENOUS | Status: DC

## 2020-10-27 MED ORDER — PINDOLOL 5 MG PO TABS: 5.0000 mg | ORAL_TABLET | Freq: Two times a day (BID) | ORAL | 6 refills | Status: DC

## 2020-10-27 MED ORDER — ALPRAZOLAM 0.25 MG PO TABS: 0.2500 mg | ORAL_TABLET | Freq: Two times a day (BID) | ORAL | Status: DC | PRN

## 2020-10-27 MED ORDER — INFLUENZA VAC A&B SA ADJ QUAD 0.5 ML IM PRSY: 0.5000 mL | PREFILLED_SYRINGE | INTRAMUSCULAR | Status: DC

## 2020-10-27 NOTE — Progress Notes (Signed)
Pt ambulated hall with NT without tele alarms.  No signs of SVT or bradycardia.   Will cont plan of care

## 2020-10-27 NOTE — Discharge Instructions (Signed)
Please do not take your amlodipine, losartan, and hydrochlorothiazide-triamterene. Please monitor your blood pressure at home and notify the office if persistently >140/80, at which time we may consider restarting your medicine in a step wise fashion.   We have stopped your bisoprolol and started pindolol for improved heart rate control.

## 2020-10-27 NOTE — Discharge Summary (Signed)
Discharge Summary    Patient ID: Kim Ward MRN: [id number]; DOB: Jul 29, 1931  Admit date: 10/26/2020 Discharge date: 10/27/2020  Primary Care Provider: Linus Mako, NP  Primary Cardiologist: Kirk Ruths, MD  Primary Electrophysiologist:  None   Discharge Diagnoses    Principal Problem:   SVT (supraventricular tachycardia) ([place]) Active Problems:   Hypercholesteremia   Severe aortic stenosis   Hypertensive cardiovascular disease    Diagnostic Studies/Procedures    None _____________   History of Present Illness     Kim Ward is a 86 y.o. female with a PMH of PSVT, HTN, HLD, hypothyroid, severe AS, who presented with sudden onset heart racing. She denied chest pain, SOB, dizziness, or lightheadedness. She denied any unusual activity or stress. She drinks decaf coffee and tea at baseline and denied any OTC meds. Her neighbor came to check on her and activated EMS. She was found to be in SVT. Given 6mg  and 12 mg of adenosine en route, however SVT reoccurred. On arrival to the ED she was given IV metoprolol 5mg  with resolution of SVT. At the time of admission she was resting comfortably. She reported this was her first reoccurrence in years.    Hospital Course     Consultants: None   1. SVT: patient had an episode of SVT. Given adenosine 6mg  and 12mg  with recurrent SVT. Ultimately converted to sinus rhythm after arrival to the ED where she received metoprolol 5mg  IV. She was monitored overnight without recurrence. She was transitioned from home bisoprolol to pindolol for HR control.   - Continue pindolol - Consider referral to EP if recurrent SVT for ablation consideration  2. Hypotension in patient with HTN: BP initially soft. Home amlodipine, losartan, and triamterene-HCTZ held. Home bisoprolol transitioned to pindolol as above - Continue to monitor BP at home - patient instructed to contact our office if BP is persistently >140/80 - continue pindolol  3. Severe  aortic stenosis: noted on last echo 08/2020. She remains asymptomatic without chest pain, SOB, or syncope.  - Continue routine outpatient monitoring  - Consider referral to structural heart for TAVR consideration should she develop symptoms.  4. HLD: no recent lipids on file - Home statin continued.  Did the patient have an acute coronary syndrome (MI, NSTEMI, STEMI, etc) this admission?:  No                               Did the patient have a percutaneous coronary intervention (stent / angioplasty)?:  No.       _____________  Discharge Vitals Blood pressure (!) 130/42, pulse 65, temperature 97.7 F (36.5 C), temperature source Oral, resp. rate 13, height 5\' 1"  (1.549 m), weight 68.7 kg, last menstrual period 11/19/1983, SpO2 96 %.  Filed Weights   10/27/20 0401  Weight: 68.7 kg    Labs & Radiologic Studies    CBC Recent Labs    10/26/20 1628 10/27/20 0319  WBC 8.5 8.5  NEUTROABS 5.5  --   HGB 13.7 12.4  HCT 44.0 37.9  MCV 90.7 88.8  PLT 253 010   Basic Metabolic Panel Recent Labs    10/26/20 1628 10/27/20 0318  NA 138 140  K 4.4 3.9  CL 105 106  CO2 23 25  GLUCOSE 95 108*  BUN 13 14  CREATININE 0.87 0.87  CALCIUM 9.1 8.8*   Liver Function Tests Recent Labs    10/26/20 1628  AST 18  ALT 15  ALKPHOS 68  BILITOT 0.5  PROT 6.2*  ALBUMIN 3.6   No results for input(s): LIPASE, AMYLASE in the last 72 hours. High Sensitivity Troponin:   Recent Labs  Lab 10/26/20 1628 10/27/20 0318  TROPONINIHS 184* 115*    BNP Invalid input(s): POCBNP D-Dimer No results for input(s): DDIMER in the last 72 hours. Hemoglobin A1C No results for input(s): HGBA1C in the last 72 hours. Fasting Lipid Panel No results for input(s): CHOL, HDL, LDLCALC, TRIG, CHOLHDL, LDLDIRECT in the last 72 hours. Thyroid Function Tests No results for input(s): TSH, T4TOTAL, T3FREE, THYROIDAB in the last 72 hours.  Invalid input(s): FREET3 _____________  No results  found. Disposition   Pt is being discharged home today in good condition.  Follow-up Plans & Appointments     Follow-up Information    Lelon Perla, MD Follow up on 12/06/2020.   Specialty: Cardiology Why: Please arrive 15 minutes early for your 11am post-hospital cardiology appointment Contact information: [address] [place] [address] [phone]                Discharge Medications   Allergies as of 10/27/2020      Reactions   Augmentin [amoxicillin-pot Clavulanate]    Azithromycin    Or any drug that ends in "mycin"   Bextra [valdecoxib]    Unknown   Cefuroxime Other (See Comments)   dizziness   Cefuroxime Axetil    Severe dizziness   Etodolac Other (See Comments)   Reaction unknown   Humibid La [guaifenesin] Other (See Comments)   Mouth sores   Lipitor [atorvastatin Calcium]    MYALGIAS   Ramipril Cough   Risedronate Sodium     GI ISSUES   Tramadol Other (See Comments)   Vomiting and diarrhea   Zocor [simvastatin]    MYALGIAS   Niaspan [niacin Er] Rash      Medication List    STOP taking these medications   amLODipine 2.5 MG tablet Commonly known as: NORVASC   bisoprolol 5 MG tablet Commonly known as: ZEBETA   losartan 50 MG tablet Commonly known as: COZAAR   triamterene-hydrochlorothiazide 37.5-25 MG tablet Commonly known as: MAXZIDE-25     TAKE these medications   ALPRAZolam 0.5 MG tablet Commonly known as: XANAX Take 0.5 mg by mouth 2 (two) times daily as needed for anxiety or sleep.   amoxicillin 500 MG capsule Commonly known as: AMOXIL Take 500 mg by mouth as directed. 4 capsules before dental procedures   BIOTIN PO Take 1 tablet by mouth daily.   calcium-vitamin D 500-200 MG-UNIT tablet Commonly known as: OSCAL WITH D Take 1 tablet by mouth daily with breakfast.   ibuprofen 200 MG tablet Commonly known as: ADVIL Take 400 mg by mouth every 6 (six) hours as needed. For pain   loratadine 10  MG tablet Commonly known as: CLARITIN Take 10 mg by mouth daily.   pindolol 5 MG tablet Commonly known as: VISKEN Take 1 tablet (5 mg total) by mouth 2 (two) times daily.   potassium chloride 10 MEQ tablet Commonly known as: KLOR-CON Take 10 mEq by mouth daily.   rosuvastatin 5 MG tablet Commonly known as: CRESTOR Take 5 mg by mouth every Monday, Wednesday, and Friday.   traMADol 50 MG tablet Commonly known as: ULTRAM Take 25-100 mg by mouth every 6 (six) hours as needed for moderate pain.   zolpidem 5 MG tablet Commonly known as: AMBIEN Take 5 mg  by mouth at bedtime as needed for sleep.          Outstanding Labs/Studies   None  Duration of Discharge Encounter   Greater than 30 minutes including physician time.  Signed, Abigail Butts, PA-C 10/27/2020, 1:05 PM

## 2020-10-27 NOTE — Progress Notes (Signed)
Progress Note  Patient Name: Kim Ward Date of Encounter: 10/27/2020  [facility] Cardiologist: Kirk Ruths, MD   Subjective   Mild chest heaviness but no other chest pain.  No dyspnea.  No recurrent palpitations.  Inpatient Medications    Scheduled Meds: . calcium-vitamin D  1 tablet Oral Q breakfast  . enoxaparin (LOVENOX) injection  40 mg Subcutaneous Q24H  . [START ON 10/28/2020] influenza vaccine adjuvanted  0.5 mL Intramuscular Tomorrow-1000  . loratadine  10 mg Oral Daily  . pindolol  5 mg Oral BID  . rosuvastatin  5 mg Oral Once per day on Mon Wed Fri  . sodium chloride flush  3 mL Intravenous Q12H   Continuous Infusions: . sodium chloride     PRN Meds: sodium chloride, acetaminophen, ALPRAZolam, ibuprofen, nitroGLYCERIN, ondansetron (ZOFRAN) IV, sodium chloride flush, zolpidem   Vital Signs    Vitals:   10/27/20 0100 10/27/20 0145 10/27/20 0237 10/27/20 0401  BP: 109/72 (!) 107/53 (!) 138/54 (!) 126/45  Pulse: 66 61 61 61  Resp: 17 16 18 16   Temp:  97.6 F (36.4 C) 98 F (36.7 C) 97.7 F (36.5 C)  TempSrc:  Oral Oral Oral  SpO2: 96% 97% 99% 97%  Weight:    68.7 kg  Height:    5\' 1"  (1.549 m)   No intake or output data in the 24 hours ending 10/27/20 0859 Last 3 Weights 10/27/2020 09/19/2020 07/05/2020  Weight (lbs) 151 lb 8 oz 159 lb 6.4 oz 162 lb 6.4 oz  Weight (kg) 68.72 kg 72.303 kg 73.664 kg      Telemetry    Sinus- Personally Reviewed  Physical Exam   GEN: No acute distress.   Neck: No JVD Cardiac: RRR, 2/6 systolic murmur Respiratory: Clear to auscultation bilaterally. GI: Soft, nontender, non-distended  MS: No edema Neuro:  Nonfocal  Psych: Normal affect   Labs    High Sensitivity Troponin:   Recent Labs  Lab 10/26/20 1628 10/27/20 0318  TROPONINIHS 184* 115*      Chemistry Recent Labs  Lab 10/26/20 1628 10/27/20 0318  NA 138 140  K 4.4 3.9  CL 105 106  CO2 23 25  GLUCOSE 95 108*  BUN 13 14  CREATININE  0.87 0.87  CALCIUM 9.1 8.8*  PROT 6.2*  --   ALBUMIN 3.6  --   AST 18  --   ALT 15  --   ALKPHOS 68  --   BILITOT 0.5  --   GFRNONAA >60 >60  ANIONGAP 10 9     Hematology Recent Labs  Lab 10/26/20 1628 10/27/20 0319  WBC 8.5 8.5  RBC 4.85 4.27  HGB 13.7 12.4  HCT 44.0 37.9  MCV 90.7 88.8  MCH 28.2 29.0  MCHC 31.1 32.7  RDW 13.6 13.7  PLT 253 228    Patient Profile     86 year old female with past medical history of supraventricular tachycardia, hypothyroidism with supraventricular tachycardia. Last echocardiogram October 2021 showed normal LV function, mild left ventricular hypertrophy, grade 2 diastolic dysfunction, mild mitral regurgitation, severe aortic stenosis and mild aortic insufficiency.   Assessment & Plan    1 supraventricular tachycardia-patient has had no recurrences since admission.  I have changed her to pindolol 5 mg twice daily and she is tolerating.  Plan discharge today.  If she has more frequent episodes in the future we will consider referral to electrophysiology for consideration of ablation.  We will follow for worsening bradycardia.  2 severe aortic stenosis-patient has severe aortic stenosis but remains asymptomatic with no dyspnea, chest pain or syncope.  She will need follow-up echocardiograms in the future.  Will refer for TAVR if she develops symptoms.    3 hypertension-blood pressure controlled.  Continue pindolol at present dose.  Presently not on amlodipine, losartan or Maxide.  Follow blood pressure and we will resume medications as needed.  4 hyperlipidemia-continue statin.  Discharge today and follow-up with me in 4 to 6 weeks in [facility]. Greater than 30 minutes PA and physician time. D2  For questions or updates, please contact [place] Please consult [email or web address] for contact info under        Signed, Kirk Ruths, MD  10/27/2020, 8:59 AM

## 2020-10-28 ENCOUNTER — Inpatient Hospital Stay (HOSPITAL_COMMUNITY)
Admission: EM | Admit: 2020-10-28 | Discharge: 2020-10-31 | DRG: 310 | Disposition: A | Discharge status: Nursing Home (Includes ICF) | Payer: Medicare PPO | Attending: Internal Medicine | Admitting: Internal Medicine

## 2020-10-28 ENCOUNTER — Other Ambulatory Visit: Payer: Self-pay

## 2020-10-28 ENCOUNTER — Emergency Department (HOSPITAL_COMMUNITY): Payer: Medicare PPO

## 2020-10-28 ENCOUNTER — Encounter (HOSPITAL_COMMUNITY): Payer: Self-pay | Admitting: Emergency Medicine

## 2020-10-28 DIAGNOSIS — I119 Hypertensive heart disease without heart failure: Secondary | ICD-10-CM | POA: Diagnosis present

## 2020-10-28 DIAGNOSIS — Z881 Allergy status to other antibiotic agents status: Secondary | ICD-10-CM

## 2020-10-28 DIAGNOSIS — M810 Age-related osteoporosis without current pathological fracture: Secondary | ICD-10-CM | POA: Diagnosis present

## 2020-10-28 DIAGNOSIS — Z885 Allergy status to narcotic agent status: Secondary | ICD-10-CM

## 2020-10-28 DIAGNOSIS — Z87891 Personal history of nicotine dependence: Secondary | ICD-10-CM | POA: Diagnosis not present

## 2020-10-28 DIAGNOSIS — E7800 Pure hypercholesterolemia, unspecified: Secondary | ICD-10-CM | POA: Diagnosis present

## 2020-10-28 DIAGNOSIS — I471 Supraventricular tachycardia: Principal | ICD-10-CM

## 2020-10-28 DIAGNOSIS — Z888 Allergy status to other drugs, medicaments and biological substances status: Secondary | ICD-10-CM | POA: Diagnosis not present

## 2020-10-28 DIAGNOSIS — Z880 Allergy status to penicillin: Secondary | ICD-10-CM | POA: Diagnosis not present

## 2020-10-28 DIAGNOSIS — I440 Atrioventricular block, first degree: Secondary | ICD-10-CM | POA: Diagnosis present

## 2020-10-28 DIAGNOSIS — Z823 Family history of stroke: Secondary | ICD-10-CM

## 2020-10-28 DIAGNOSIS — E039 Hypothyroidism, unspecified: Secondary | ICD-10-CM | POA: Diagnosis present

## 2020-10-28 DIAGNOSIS — Z87410 Personal history of cervical dysplasia: Secondary | ICD-10-CM

## 2020-10-28 DIAGNOSIS — R002 Palpitations: Secondary | ICD-10-CM | POA: Diagnosis present

## 2020-10-28 DIAGNOSIS — I350 Nonrheumatic aortic (valve) stenosis: Secondary | ICD-10-CM

## 2020-10-28 DIAGNOSIS — E785 Hyperlipidemia, unspecified: Secondary | ICD-10-CM | POA: Diagnosis present

## 2020-10-28 DIAGNOSIS — Z86010 Personal history of colonic polyps: Secondary | ICD-10-CM | POA: Diagnosis not present

## 2020-10-28 DIAGNOSIS — I10 Essential (primary) hypertension: Secondary | ICD-10-CM | POA: Diagnosis not present

## 2020-10-28 DIAGNOSIS — R52 Pain, unspecified: Secondary | ICD-10-CM

## 2020-10-28 DIAGNOSIS — Z803 Family history of malignant neoplasm of breast: Secondary | ICD-10-CM | POA: Diagnosis not present

## 2020-10-28 DIAGNOSIS — R079 Chest pain, unspecified: Secondary | ICD-10-CM

## 2020-10-28 DIAGNOSIS — Z20822 Contact with and (suspected) exposure to covid-19: Secondary | ICD-10-CM | POA: Diagnosis present

## 2020-10-28 LAB — BASIC METABOLIC PANEL
Anion gap: 11 (ref 5–15)
BUN: 16 mg/dL (ref 8–23)
CO2: 22 mmol/L (ref 22–32)
Calcium: 9 mg/dL (ref 8.9–10.3)
Chloride: 108 mmol/L (ref 98–111)
Creatinine, Ser: 0.96 mg/dL (ref 0.44–1.00)
GFR, Estimated: 57 mL/min — ABNORMAL LOW (ref >60)
Glucose, Bld: 138 mg/dL — ABNORMAL HIGH (ref 70–99)
Potassium: 3.4 mmol/L — ABNORMAL LOW (ref 3.5–5.1)
Sodium: 141 mmol/L (ref 135–145)

## 2020-10-28 LAB — CBC
HCT: 41.9 % (ref 36.0–46.0)
Hemoglobin: 12.9 g/dL (ref 12.0–15.0)
MCH: 28.3 pg (ref 26.0–34.0)
MCHC: 30.8 g/dL (ref 30.0–36.0)
MCV: 91.9 fL (ref 80.0–100.0)
Platelets: 236 10*3/uL (ref 150–400)
RBC: 4.56 MIL/uL (ref 3.87–5.11)
RDW: 13.8 % (ref 11.5–15.5)
WBC: 12.5 10*3/uL — ABNORMAL HIGH (ref 4.0–10.5)
nRBC: 0 % (ref 0.0–0.2)

## 2020-10-28 LAB — RESP PANEL BY RT-PCR (FLU A&B, COVID) ARPGX2
Influenza A by PCR: NEGATIVE
Influenza B by PCR: NEGATIVE
SARS Coronavirus 2 by RT PCR: NEGATIVE

## 2020-10-28 LAB — TSH: TSH: 1.259 u[IU]/mL (ref 0.350–4.500)

## 2020-10-28 LAB — TROPONIN I (HIGH SENSITIVITY)
Troponin I (High Sensitivity): 41 ng/L — ABNORMAL HIGH (ref <18)
Troponin I (High Sensitivity): 52 ng/L — ABNORMAL HIGH (ref <18)

## 2020-10-28 LAB — MAGNESIUM: Magnesium: 1.9 mg/dL (ref 1.7–2.4)

## 2020-10-28 MED ORDER — ALPRAZOLAM 0.5 MG PO TABS: 0.5000 mg | ORAL_TABLET | Freq: Two times a day (BID) | ORAL | Status: DC | PRN

## 2020-10-28 MED ORDER — ROSUVASTATIN CALCIUM 5 MG PO TABS
5.0000 mg | ORAL_TABLET | ORAL | Status: DC
  Administered 2020-10-30: 10:00:00 5 mg via ORAL
  Filled 2020-10-28: qty 1

## 2020-10-28 MED ORDER — AMIODARONE HCL 200 MG PO TABS
400.0000 mg | ORAL_TABLET | Freq: Two times a day (BID) | ORAL | Status: DC
  Administered 2020-10-28 – 2020-10-31 (×7): 400 mg via ORAL
  Filled 2020-10-28 (×7): qty 2

## 2020-10-28 MED ORDER — PINDOLOL 5 MG PO TABS
10.0000 mg | ORAL_TABLET | Freq: Two times a day (BID) | ORAL | Status: DC
  Administered 2020-10-28: 11:00:00 5 mg via ORAL
  Administered 2020-10-28 – 2020-10-31 (×6): 10 mg via ORAL
  Filled 2020-10-28 (×3): qty 2
  Filled 2020-10-28: qty 1
  Filled 2020-10-28 (×2): qty 2
  Filled 2020-10-28 (×3): qty 1
  Filled 2020-10-28 (×4): qty 2

## 2020-10-28 MED ORDER — MAGNESIUM HYDROXIDE 400 MG/5ML PO SUSP
30.0000 mL | Freq: Every day | ORAL | Status: DC | PRN
  Administered 2020-10-28: 18:00:00 30 mL via ORAL
  Filled 2020-10-28: qty 30

## 2020-10-28 MED ORDER — ALUM & MAG HYDROXIDE-SIMETH 200-200-20 MG/5ML PO SUSP
30.0000 mL | ORAL | Status: DC | PRN
  Administered 2020-10-30: 09:00:00 30 mL via ORAL
  Filled 2020-10-28: qty 30

## 2020-10-28 MED ORDER — ACETAMINOPHEN 325 MG PO TABS
650.0000 mg | ORAL_TABLET | ORAL | Status: DC | PRN
  Administered 2020-10-28: 650 mg via ORAL
  Filled 2020-10-28 (×2): qty 2

## 2020-10-28 MED ORDER — ADENOSINE 6 MG/2ML IV SOLN: 6.0000 mg | Freq: Once | INTRAVENOUS | Status: AC

## 2020-10-28 MED ORDER — ENOXAPARIN SODIUM 40 MG/0.4ML ~~LOC~~ SOLN
40.0000 mg | SUBCUTANEOUS | Status: DC
  Administered 2020-10-28 – 2020-10-31 (×4): 40 mg via SUBCUTANEOUS
  Filled 2020-10-28 (×4): qty 0.4

## 2020-10-28 MED ORDER — PINDOLOL 5 MG PO TABS
5.0000 mg | ORAL_TABLET | Freq: Two times a day (BID) | ORAL | Status: DC
  Filled 2020-10-28: qty 1

## 2020-10-28 MED ORDER — LOPERAMIDE HCL 2 MG PO CAPS: 2.0000 mg | ORAL_CAPSULE | ORAL | Status: DC | PRN

## 2020-10-28 MED ORDER — ONDANSETRON HCL 4 MG/2ML IJ SOLN: 4.0000 mg | Freq: Four times a day (QID) | INTRAMUSCULAR | Status: DC | PRN

## 2020-10-28 MED ORDER — ZOLPIDEM TARTRATE 5 MG PO TABS
5.0000 mg | ORAL_TABLET | Freq: Every evening | ORAL | Status: DC | PRN
  Administered 2020-10-30: 5 mg via ORAL
  Filled 2020-10-28: qty 1

## 2020-10-28 MED ORDER — CALCIUM CARBONATE-VITAMIN D 500-200 MG-UNIT PO TABS
1.0000 | ORAL_TABLET | Freq: Every day | ORAL | Status: DC
  Administered 2020-10-28 – 2020-10-31 (×4): 1 via ORAL
  Filled 2020-10-28 (×4): qty 1

## 2020-10-28 MED ORDER — ADENOSINE 6 MG/2ML IV SOLN
INTRAVENOUS | Status: AC
  Filled 2020-10-28: qty 2

## 2020-10-28 NOTE — ED Notes (Signed)
Pt heart rhythm is in SVT with a rate of 160. Cardiology is at bedside. Per MD, Narcisse, 6mg  Adenosine ordered. Pt placed on Zoll Pads/Monitor.

## 2020-10-28 NOTE — ED Notes (Signed)
Pt was in SVT for approx 15 min while waiting for MD to be present when giving adenosine.  Pt converted to NSR.

## 2020-10-28 NOTE — ED Provider Notes (Signed)
[facility] EMERGENCY DEPARTMENT Provider Note   CSN: [id number] Arrival date & time: 10/28/20  9147     History Chief Complaint  Patient presents with  . SVT    Kim Ward is a 86 y.o. female.  The history is provided by the patient and medical records. No language interpreter was used.  Palpitations Palpitations quality:  Fast Onset quality:  Sudden Duration:  1 hour Timing:  Constant Progression:  Resolved Chronicity:  Recurrent Relieved by: adenosine. Worsened by:  Nothing Ineffective treatments:  None tried Associated symptoms: chest pain, chest pressure, diaphoresis, nausea and shortness of breath   Associated symptoms: no back pain, no cough, no lower extremity edema, no malaise/fatigue, no near-syncope, no numbness, no vomiting and no weakness        Past Medical History:  Diagnosis Date  . Adenomatous colon polyp   . Aortic stenosis   . Depression    Tx'd 19 yrs ago when husband passed away  . Diverticulosis   . Heart murmur   . Hyperlipidemia   . Hypertension   . Hypothyroidism    Dr. Dwyane Dee follows pt.--had small nodules but had decreased in size and stopped Synthroid 06/2013  . Osteopenia   . Osteoporosis   . Pure hypercholesterolemia   . SVT (supraventricular tachycardia) [facility])     Patient Active Problem List   Diagnosis Date Noted  . Hypertensive cardiovascular disease 06/26/2016  . Dizziness 06/26/2016  . Low back pain 07/26/2015  . Constipation 03/01/2014  . Alopecia 09/29/2013  . Multinodular goiter 07/22/2013  . Benign hypertensive heart disease without heart failure 03/08/2013  . Elevated troponin 10/10/2012  . SVT (supraventricular tachycardia) ([place]) 12/31/2011  . Hypercholesteremia 02/11/2011  . Hypothyroid 02/11/2011  . Severe aortic stenosis 02/11/2011    Past Surgical History:  Procedure Laterality Date  . CERVICAL BIOPSY  W/ LOOP ELECTRODE EXCISION  1996   CIN I  . COLONOSCOPY  03/2001, 01/2007   2008  - no polyps  . DILATION AND CURETTAGE OF UTERUS    . TONSILLECTOMY       OB History    Gravida  5   Para  3   Term      Preterm      AB      Living  3     SAB      IAB      Ectopic      Multiple      Live Births              Family History  Problem Relation Age of Onset  . Stroke Sister   . Breast cancer Sister   . Breast cancer Daughter 12       metastatic breast ca    Social History   Tobacco Use  . Smoking status: Former Smoker    Quit date: 06/17/1972    Years since quitting: 48.3  . Smokeless tobacco: Never Used  . Tobacco comment: quit June 17, 1972  Substance Use Topics  . Alcohol use: Yes    Alcohol/week: 0.0 standard drinks    Comment: 2 drinks per month  . Drug use: No    Home Medications Prior to Admission medications   Medication Sig Start Date End Date Taking? Authorizing Provider  ALPRAZolam Duanne Moron) 0.5 MG tablet Take 0.5 mg by mouth 2 (two) times daily as needed for anxiety or sleep. 06/06/20   [provider]  amoxicillin (AMOXIL) 500 MG capsule Take 500 mg  by mouth as directed. 4 capsules before dental procedures    [provider]  BIOTIN PO Take 1 tablet by mouth daily.     [provider]  calcium-vitamin D (OSCAL WITH D) 500-200 MG-UNIT tablet Take 1 tablet by mouth daily with breakfast.    [provider]  ibuprofen (ADVIL) 200 MG tablet Take 400 mg by mouth every 6 (six) hours as needed. For pain    [provider]  loratadine (CLARITIN) 10 MG tablet Take 10 mg by mouth daily.    [provider]  pindolol (VISKEN) 5 MG tablet Take 1 tablet (5 mg total) by mouth 2 (two) times daily. 10/27/20   Kroeger, Lorelee Cover., PA-C  potassium chloride (KLOR-CON) 10 MEQ tablet Take 10 mEq by mouth daily. 03/19/17   [provider]  rosuvastatin (CRESTOR) 5 MG tablet Take 5 mg by mouth every Monday, Wednesday, and Friday. 02/21/20   [provider]  traMADol (ULTRAM) 50 MG  tablet Take 25-100 mg by mouth every 6 (six) hours as needed for moderate pain. 01/26/19   [provider]  zolpidem (AMBIEN) 5 MG tablet Take 5 mg by mouth at bedtime as needed for sleep.    [provider]    Allergies    Augmentin [amoxicillin-pot clavulanate], Azithromycin, Bextra [valdecoxib], Cefuroxime, Cefuroxime axetil, Etodolac, Humibid la [guaifenesin], Lipitor [atorvastatin calcium], Ramipril, Risedronate sodium, Tramadol, Zocor [simvastatin], and Niaspan [niacin er]  Review of Systems   Review of Systems  Constitutional: Positive for diaphoresis. Negative for chills, fatigue, fever and malaise/fatigue.  HENT: Negative for congestion.   Respiratory: Positive for shortness of breath. Negative for cough, chest tightness and wheezing.   Cardiovascular: Positive for chest pain and palpitations. Negative for leg swelling and near-syncope.  Gastrointestinal: Positive for nausea. Negative for abdominal pain, constipation, diarrhea and vomiting.  Genitourinary: Negative for dysuria, flank pain and frequency.  Musculoskeletal: Negative for back pain, neck pain and neck stiffness.  Skin: Negative for rash and wound.  Neurological: Negative for weakness, light-headedness, numbness and headaches.  Psychiatric/Behavioral: Negative for agitation.  All other systems reviewed and are negative.   Physical Exam Updated Vital Signs BP 140/69   Pulse (!) 108   Temp 97.9 F (36.6 C)   Resp (!) 23   Ht 5\' 1"  (1.549 m)   Wt 68 kg   LMP 11/19/1983   SpO2 96%   BMI 28.33 kg/m   Physical Exam Vitals and nursing note reviewed.  Constitutional:      General: She is not in acute distress.    Appearance: She is well-developed and well-nourished. She is not ill-appearing, toxic-appearing or diaphoretic.  HENT:     Head: Normocephalic and atraumatic.     Right Ear: External ear normal.     Left Ear: External ear normal.     Nose: Nose normal. No congestion or rhinorrhea.      Mouth/Throat:     Mouth: Oropharynx is clear and moist. Mucous membranes are moist.     Pharynx: No oropharyngeal exudate or posterior oropharyngeal erythema.  Eyes:     Extraocular Movements: Extraocular movements intact and EOM normal.     Conjunctiva/sclera: Conjunctivae normal.     Pupils: Pupils are equal, round, and reactive to light.  Cardiovascular:     Rate and Rhythm: Regular rhythm. Tachycardia present.     Pulses: Normal pulses.     Heart sounds: Murmur heard.    Pulmonary:     Effort: Pulmonary effort is normal.  No respiratory distress.     Breath sounds: No stridor. No wheezing, rhonchi or rales.  Chest:     Chest wall: No tenderness.  Abdominal:     General: Abdomen is flat. There is no distension.     Tenderness: There is no abdominal tenderness. There is no right CVA tenderness, left CVA tenderness, guarding or rebound.  Musculoskeletal:        General: No tenderness.     Cervical back: Normal range of motion and neck supple. No tenderness.     Right lower leg: No edema.     Left lower leg: No edema.  Skin:    General: Skin is warm.     Capillary Refill: Capillary refill takes less than 2 seconds.     Coloration: Skin is not pale.     Findings: No erythema or rash.  Neurological:     Mental Status: She is alert and oriented to person, place, and time.     Sensory: No sensory deficit.     Motor: No weakness or abnormal muscle tone.     Deep Tendon Reflexes: Reflexes are normal and symmetric.  Psychiatric:        Mood and Affect: Mood normal.     ED Results / Procedures / Treatments   Labs (all labs ordered are listed, but only abnormal results are displayed) Labs Reviewed  BASIC METABOLIC PANEL - Abnormal; Notable for the following components:      Result Value   Potassium 3.4 (*)    Glucose, Bld 138 (*)    GFR, Estimated 57 (*)    All other components within normal limits  CBC - Abnormal; Notable for the following components:   WBC 12.5 (*)     All other components within normal limits  TROPONIN I (HIGH SENSITIVITY) - Abnormal; Notable for the following components:   Troponin I (High Sensitivity) 41 (*)    All other components within normal limits  TROPONIN I (HIGH SENSITIVITY) - Abnormal; Notable for the following components:   Troponin I (High Sensitivity) 52 (*)    All other components within normal limits  MAGNESIUM  TSH    EKG EKG Interpretation  Date/Time:  Saturday October 28 2020 02:31:37 EST Ventricular Rate:  106 PR Interval:    QRS Duration: 144 QT Interval:  367 QTC Calculation: 488 R Axis:   57 Text Interpretation: Sinus tachycardia Right bundle branch block when compared to prior, sinus rhythm. No STEMI Confirmed by Antony Blackbird [phone]) on 10/28/2020 3:38:46 AM   Radiology DG Chest Port 1 View  Result Date: 10/28/2020 CLINICAL DATA:  Heart racing EXAM: PORTABLE CHEST 1 VIEW COMPARISON:  02/07/2020 FINDINGS: Heart and mediastinal contours are within normal limits. No focal opacities or effusions. No acute bony abnormality. IMPRESSION: No active disease. Electronically Signed   By: Rolm Baptise M.D.   On: 10/28/2020 03:03    Procedures Procedures (including critical care time)  Medications Ordered in ED Medications  pindolol (VISKEN) tablet 5 mg (has no administration in time range)  rosuvastatin (CRESTOR) tablet 5 mg (has no administration in time range)  ALPRAZolam (XANAX) tablet 0.5 mg (has no administration in time range)  zolpidem (AMBIEN) tablet 5 mg (has no administration in time range)  calcium-vitamin D (OSCAL WITH D) 500-200 MG-UNIT per tablet 1 tablet (has no administration in time range)  acetaminophen (TYLENOL) tablet 650 mg (has no administration in time range)  ondansetron (ZOFRAN) injection 4 mg (has no administration in time range)  enoxaparin (  LOVENOX) injection 40 mg (has no administration in time range)  adenosine (ADENOCARD) 6 MG/2ML injection 6 mg ( Intravenous Given 10/28/20  0541)    ED Course  I have reviewed the triage vital signs and the nursing notes.  Pertinent labs & imaging results that were available during my care of the patient were reviewed by me and considered in my medical decision making (see chart for details).    MDM Rules/Calculators/A&P                          Kim Ward is a 86 y.o. female with a past medical history significant for recurrent SVT, hypertension, hypercholesterolemia, hypothyroidism, aortic stenosis, and osteopenia who presents with recurrent SVT.  Patient says that she was discharged yesterday after admission for palpitations and SVT.  She says that she went home, had a good meal, then went to bed.  She reports she woke up approximately 1 hour prior to arrival with severe palpitations, chest pressure, shortness of breath, diaphoresis.  She reports that she "thought this was the end".  She says that they called EMS and gave her 6 mg of adenosine and she converted back to sinus rhythm.  She then went back in and out of SVT 1 more time without needing intervention on the way to the emergency department.  She currently denies any palpitations or chest pain or shortness of breath and is frustrated that this keeps happening.  She reports no other fevers, chills, cough, urinary changes or GI symptoms.  She is resting comfortably now in sinus rhythm on arrival.  On exam, patient has a regular rate.  She does have a murmur.  Chest is nontender and abdomen is nontender.  Good pulses in extremities.  Legs are nontender nonedematous.  Lungs are clear.  Patient is resting.  EKG shows sinus rhythm with no STEMI.  Due to her being discharged yesterday from cardiology and this happening again, cardiology was called.  They initially requested medicine to admit but then after reviewed the chart, as she was discharged on their service, they will admit tonight.  They will likely have the EP team see the patient to discuss further management as the  plan was to consider ablation or other procedure if this kept happening.  Patient is in agreement with plan, anticipate admission by cardiology after work-up is completed.   Final Clinical Impression(s) / ED Diagnoses Final diagnoses:  SVT (supraventricular tachycardia) (H[facility])  Chest pain, unspecified type  Palpitations     Clinical Impression: 1. SVT (supraventricular tachycardia) (HCC)   2. Pain   3. Chest pain, unspecified type   4. Palpitations     Disposition: Admit  This note was prepared with assistance of Dragon voice recognition software. Occasional wrong-word or sound-a-like substitutions may have occurred due to the inherent limitations of voice recognition software.     Chequita Mofield, Gwenyth Allegra, MD 10/28/20 [phone]

## 2020-10-28 NOTE — ED Notes (Signed)
Assisted pt on bedpan.

## 2020-10-28 NOTE — ED Notes (Signed)
Breakfast Ordered 

## 2020-10-28 NOTE — ED Notes (Signed)
6mg  Adenosine given by Lenna Sciara, RN. Pt returned to a NSR. Pt did have a brief episode of Complete Heart Block that converted back to NSR. Pt does not appear to be in distress at this time. Pt is not complaining of Chest Pain.

## 2020-10-28 NOTE — Progress Notes (Signed)
Received patient to room 3E15 alert and oriented x 4. Lungs clear in all lobes, resps even and unlabored. HR is currently regular but patient will occasionally go in and out of SVT; has self converted to NSR twice today. Abdomen soft, nontender, nondistended with active bowel sounds all quads. Patient reports last BM 12/9 and requested MOM; will administer this afternoon/evening. Patient is continent of bowel and bladder and requires only standby assist to [facility] as needed. Her skin is clear and free from any openareasand/or bruising; intact other than the PIV to left AC. This area is slightly pink despite being inserted only today; it flushes with no problem, is not warm, indurated or painful. Will monitor for change. Call light is in reach, can make needs known.

## 2020-10-28 NOTE — ED Triage Notes (Signed)
Pt transported from [place] for reports of being woke up for racing HR, pt d/c from IP today @ 1400 for same. Pt reports she has not picked up scripts yet. Pt found to be in SVT at 170, given Adenosine 6mg , converted, brief return then self converted.  Pt A & O, denies pain.

## 2020-10-28 NOTE — H&P (Signed)
Cardiology Admission History and Physical:   Patient ID: Kim Ward MRN: [id number]; DOB: 1931-10-04   Admission date: 10/28/2020  Primary Care Provider: Linus Mako, NP Primary Cardiologist: Kirk Ruths, MD  Primary Electrophysiologist:  None   Chief Complaint:  palpitations  Patient Profile:   Kim Ward is a 86 y.o. female with a PMH of PSVT, HTN, HLD, hypothyroid, severe AS, who presented with sudden onset heart racing  History of Present Illness:   Kim Ward is a 86 y.o. female with a PMH of PSVT, HTN, HLD, hypothyroid, severe AS, who presented with sudden onset heart racing. She was discharged yesterday with plan for medical therapy of her SVT however after about 6 hours at home, went back into it and felt poorly. No chest pain just lightheaded and heart racing. Spontaneously converted to sinus rhythm. Initially on my arrival to ED to see her, she was resting comfortably, however she went back into SVT and I gave 6mg  adenosine which reverted her to NSR.    Past Medical History:  Diagnosis Date  . Adenomatous colon polyp   . Aortic stenosis   . Depression    Tx'd 19 yrs ago when husband passed away  . Diverticulosis   . Heart murmur   . Hyperlipidemia   . Hypertension   . Hypothyroidism    Dr. Dwyane Dee follows pt.--had small nodules but had decreased in size and stopped Synthroid 06/2013  . Osteopenia   . Osteoporosis   . Pure hypercholesterolemia   . SVT (supraventricular tachycardia) ([facility])     Past Surgical History:  Procedure Laterality Date  . CERVICAL BIOPSY  W/ LOOP ELECTRODE EXCISION  1996   CIN I  . COLONOSCOPY  03/2001, 01/2007   2008 - no polyps  . DILATION AND CURETTAGE OF UTERUS    . TONSILLECTOMY       Medications Prior to Admission: Prior to Admission medications   Medication Sig Start Date End Date Taking? Authorizing Provider  ALPRAZolam Duanne Moron) 0.5 MG tablet Take 0.5 mg by mouth 2 (two) times daily as needed for anxiety or sleep.  06/06/20  Yes [provider]  amoxicillin (AMOXIL) 500 MG capsule Take 500 mg by mouth as directed. 4 capsules before dental procedures   Yes [provider]  BIOTIN PO Take 1 tablet by mouth daily.    Yes [provider]  calcium-vitamin D (OSCAL WITH D) 500-200 MG-UNIT tablet Take 1 tablet by mouth daily with breakfast.   Yes [provider]  ibuprofen (ADVIL) 200 MG tablet Take 400 mg by mouth every 6 (six) hours as needed. For pain   Yes [provider]  loratadine (CLARITIN) 10 MG tablet Take 10 mg by mouth daily.   Yes [provider]  pindolol (VISKEN) 5 MG tablet Take 1 tablet (5 mg total) by mouth 2 (two) times daily. 10/27/20  Yes Kroeger, Daleen Snook M., PA-C  potassium chloride (KLOR-CON) 10 MEQ tablet Take 10 mEq by mouth daily. 03/19/17  Yes [provider]  rosuvastatin (CRESTOR) 5 MG tablet Take 5 mg by mouth every Monday, Wednesday, and Friday. 02/21/20  Yes [provider]  traMADol (ULTRAM) 50 MG tablet Take 25-100 mg by mouth every 6 (six) hours as needed for moderate pain. 01/26/19  Yes [provider]  zolpidem (AMBIEN) 5 MG tablet Take 5 mg by mouth at bedtime as needed for sleep.   Yes [provider]     Allergies:    Allergies  Allergen  Reactions  . Augmentin [Amoxicillin-Pot Clavulanate]   . Azithromycin     Or any drug that ends in "mycin"  . Bextra [Valdecoxib]     Unknown  . Cefuroxime Other (See Comments)    dizziness  . Cefuroxime Axetil     Severe dizziness  . Etodolac Other (See Comments)    Reaction unknown  . Humibid La [Guaifenesin] Other (See Comments)    Mouth sores  . Lipitor [Atorvastatin Calcium]     MYALGIAS  . Ramipril Cough  . Risedronate Sodium      GI ISSUES  . Tramadol Other (See Comments)    Vomiting and diarrhea  . Zocor [Simvastatin]     MYALGIAS  . Niaspan [Niacin Er] Rash    Social History:   Social History   Socioeconomic History  . Marital  status: Widowed    Spouse name: Not on file  . Number of children: Not on file  . Years of education: Not on file  . Highest education level: Not on file  Occupational History  . Not on file  Tobacco Use  . Smoking status: Former Smoker    Quit date: 06/17/1972    Years since quitting: 48.3  . Smokeless tobacco: Never Used  . Tobacco comment: quit June 17, 1972  Substance and Sexual Activity  . Alcohol use: Yes    Alcohol/week: 0.0 standard drinks    Comment: 2 drinks per month  . Drug use: No  . Sexual activity: Never    Partners: Male    Birth control/protection: Post-menopausal  Other Topics Concern  . Not on file  Social History Narrative  . Not on file   Social Determinants of Health   Financial Resource Strain: Not on file  Food Insecurity: Not on file  Transportation Needs: Not on file  Physical Activity: Not on file  Stress: Not on file  Social Connections: Not on file  Intimate Partner Violence: Not on file    Family History:   The patient's family history includes Breast cancer in her sister; Breast cancer (age of onset: 83) in her daughter; Stroke in her sister.    Review of Systems: [y] = yes, [ ]  = no     General: Weight gain [ ] ; Weight loss [ ] ; Anorexia [ ] ; Fatigue [ ] ; Fever [ ] ; Chills [ ] ; Weakness [ x]   Cardiac: Chest pain/pressure [ ] ; Resting SOB [ ] ; Exertional SOB [ ] ; Orthopnea [ ] ; Pedal Edema [ ] ; Palpitations [ x]; Syncope [ ] ; Presyncope [ ] ; Paroxysmal nocturnal dyspnea[ ]    Pulmonary: Cough [ ] ; Wheezing[ ] ; Hemoptysis[ ] ; Sputum [ ] ; Snoring [ ]    GI: Vomiting[ ] ; Dysphagia[ ] ; Melena[ ] ; Hematochezia [ ] ; Heartburn[ ] ; Abdominal pain [ ] ; Constipation [ ] ; Diarrhea [ ] ; BRBPR [ ]    GU: Hematuria[ ] ; Dysuria [ ] ; Nocturia[ ]    Vascular: Pain in legs with walking [ ] ; Pain in feet with lying flat [ ] ; Non-healing sores [ ] ; Stroke [ ] ; TIA [ ] ; Slurred speech [ ] ;   Neuro: Headaches[ ] ; Vertigo[ ] ; Seizures[ ] ; Paresthesias[  ];Blurred vision [ ] ; Diplopia [ ] ; Vision changes [ ]    Ortho/Skin: Arthritis [ ] ; Joint pain [ ] ; Muscle pain [ ] ; Joint swelling [ ] ; Back Pain [ ] ; Rash [ ]    Psych: Depression[ ] ; Anxiety[ ]    Heme: Bleeding problems [ ] ; Clotting disorders [ ] ; Anemia [ ]    Endocrine: Diabetes [ ] ;  Thyroid dysfunction[ ]   Physical Exam/Data:   Vitals:   10/28/20 0540 10/28/20 0542 10/28/20 0543 10/28/20 0600  BP:    (!) 152/65  Pulse: (!) 156 (!) 127 97 88  Resp: (!) 25 19 20 19   Temp:      SpO2: 98% 98% 98% 96%  Weight:      Height:       No intake or output data in the 24 hours ending 10/28/20 0613 Filed Weights   10/28/20 0232  Weight: 68 kg   Body mass index is 28.33 kg/m.  General:  Elderly female in NAD  HEENT: normal Lymph: no adenopathy Neck: noJVD Endocrine:  No thryomegaly Vascular: No carotid bruits; FA pulses 2+ bilaterally without bruits  Cardiac:  normal S1, S2; RRR; loud systolic ejection murmur  Lungs:  clear to auscultation bilaterally, no wheezing, rhonchi or rales  Abd: soft, nontender, no hepatomegaly  Ext: no edema Musculoskeletal:  No deformities, BUE and BLE strength normal and equal Skin: warm and dry  Neuro:  CNs 2-12 intact, no focal abnormalities noted Psych:  Normal affect    EKG:  The ECG that was done  was personally reviewed and demonstrates NSR with prolonged PR interval  Relevant CV Studies: none  Laboratory Data:  Chemistry Recent Labs  Lab 10/27/20 0318 10/28/20 0234  NA 140 141  K 3.9 3.4*  CL 106 108  CO2 25 22  GLUCOSE 108* 138*  BUN 14 16  CREATININE 0.87 0.96  CALCIUM 8.8* 9.0  GFRNONAA >60 57*  ANIONGAP 9 11    Recent Labs  Lab 10/26/20 1628  PROT 6.2*  ALBUMIN 3.6  AST 18  ALT 15  ALKPHOS 68  BILITOT 0.5   Hematology Recent Labs  Lab 10/27/20 0319 10/28/20 0234  WBC 8.5 12.5*  RBC 4.27 4.56  HGB 12.4 12.9  HCT 37.9 41.9  MCV 88.8 91.9  MCH 29.0 28.3  MCHC 32.7 30.8  RDW 13.7 13.8  PLT 228 236    Cardiac EnzymesNo results for input(s): TROPONINI in the last 168 hours. No results for input(s): TROPIPOC in the last 168 hours.  BNPNo results for input(s): BNP, PROBNP in the last 168 hours.  DDimer No results for input(s): DDIMER in the last 168 hours.  Radiology/Studies:  DG Chest Port 1 View  Result Date: 10/28/2020 CLINICAL DATA:  Heart racing EXAM: PORTABLE CHEST 1 VIEW COMPARISON:  02/07/2020 FINDINGS: Heart and mediastinal contours are within normal limits. No focal opacities or effusions. No acute bony abnormality. IMPRESSION: No active disease. Electronically Signed   By: Rolm Baptise M.D.   On: 10/28/2020 03:03    Assessment and Plan:   1. SVT. Patient has now had repeated episodes of SVT that require adenosine to break. I witnessed one and it is not atrial flutter, most likely AVNRT. She will be continued on pindolol and we will have EP see her for consideration of ablation given recurrent.  2. HTN. Continue pindolol 3. HLD. Continue home statin 4. Severe aortic stenosis. Not symtpomatic at this time. Will continue to monitor.   Severity of Illness: The appropriate patient status for this patient is INPATIENT. Inpatient status is judged to be reasonable and necessary in order to provide the required intensity of service to ensure the patient's safety. The patient's presenting symptoms, physical exam findings, and initial radiographic and laboratory data in the context of their chronic comorbidities is felt to place them at high risk for further clinical deterioration. Furthermore, it is not  anticipated that the patient will be medically stable for discharge from the hospital within 2 midnights of admission. The following factors support the patient status of inpatient.   " The patient's presenting symptoms include palpitaitons. " The worrisome physical exam findings include tachycardia. " The initial radiographic and laboratory data are worrisome because of SVT. " The  chronic co-morbidities include severe AS.   * I certify that at the point of admission it is my clinical judgment that the patient will require inpatient hospital care spanning beyond 2 midnights from the point of admission due to high intensity of service, high risk for further deterioration and high frequency of surveillance required.*    For questions or updates, please contact [facility] Please consult [email or web address] for contact info under        Signed, Doyne Keel, MD  10/28/2020 6:13 AM

## 2020-10-28 NOTE — Progress Notes (Signed)
Cardiology Progress [place]:   Patient ID: Kim Ward MRN: [id number]; DOB: 1931-04-07   Admission date: 10/28/2020  Primary Care Provider: Linus Mako, NP Primary Cardiologist: Kirk Ruths, MD  Primary Electrophysiologist:  None   Chief Complaint:  palpitations  Patient Profile:   Kim Ward is a 86 y.o. female with a PMH of PSVT, HTN, HLD, hypothyroid, severe AS, who presented with AM SVT  Subjective   Kim Ward is a 86 y.o. female with a PMH of PSVT, HTN, HLD, hypothyroid, severe AS, who presented with sudden onset heart racing. She was discharged 10/26/20 with AVNTR on BB.  Despite this, returned to tachycardia.  Has had sequale of this that have aborted with vagal maneuvers, but has had multiple salvos today.  Symptomatic with palpitations and lightheadedness.  Received 5 mg PO of her BB.  Found to have SVT rate 156 bpm.  Medications Prior to Admission: Prior to Admission medications   Medication Sig Start Date End Date Taking? Authorizing Provider  ALPRAZolam Duanne Moron) 0.5 MG tablet Take 0.5 mg by mouth 2 (two) times daily as needed for anxiety or sleep. 06/06/20  Yes [provider]  amoxicillin (AMOXIL) 500 MG capsule Take 500 mg by mouth as directed. 4 capsules before dental procedures   Yes [provider]  BIOTIN PO Take 1 tablet by mouth daily.    Yes [provider]  calcium-vitamin D (OSCAL WITH D) 500-200 MG-UNIT tablet Take 1 tablet by mouth daily with breakfast.   Yes [provider]  ibuprofen (ADVIL) 200 MG tablet Take 400 mg by mouth every 6 (six) hours as needed. For pain   Yes [provider]  loratadine (CLARITIN) 10 MG tablet Take 10 mg by mouth daily.   Yes [provider]  pindolol (VISKEN) 5 MG tablet Take 1 tablet (5 mg total) by mouth 2 (two) times daily. 10/27/20  Yes Kroeger, Daleen Snook M., PA-C  potassium chloride (KLOR-CON) 10 MEQ tablet Take 10 mEq by mouth daily. 03/19/17  Yes [provider]  rosuvastatin (CRESTOR) 5 MG tablet Take 5 mg by mouth every Monday, Wednesday, and Friday. 02/21/20  Yes [provider]  traMADol (ULTRAM) 50 MG tablet Take 25-100 mg by mouth every 6 (six) hours as needed for moderate pain. 01/26/19  Yes [provider]  zolpidem (AMBIEN) 5 MG tablet Take 5 mg by mouth at bedtime as needed for sleep.   Yes [provider]     Allergies:    Allergies  Allergen Reactions  . Augmentin [Amoxicillin-Pot Clavulanate]   . Azithromycin     Or any drug that ends in "mycin"  . Bextra [Valdecoxib]     Unknown  . Cefuroxime Other (See Comments)    dizziness  . Cefuroxime Axetil     Severe dizziness  . Etodolac Other (See Comments)    Reaction unknown  . Humibid La [Guaifenesin] Other (See Comments)    Mouth sores  . Lipitor [Atorvastatin Calcium]     MYALGIAS  . Ramipril Cough  . Risedronate Sodium      GI ISSUES  . Tramadol Other (See Comments)    Vomiting and diarrhea  . Zocor [Simvastatin]     MYALGIAS  . Niaspan [Niacin Er] Rash    Social History:   Social History   Socioeconomic History  . Marital status: Widowed    Spouse name: Not on file  . Number of children: Not on file  . Years of education: Not on  file  . Highest education level: Not on file  Occupational History  . Not on file  Tobacco Use  . Smoking status: Former Smoker    Quit date: 06/17/1972    Years since quitting: 48.3  . Smokeless tobacco: Never Used  . Tobacco comment: quit June 17, 1972  Substance and Sexual Activity  . Alcohol use: Yes    Alcohol/week: 0.0 standard drinks    Comment: 2 drinks per month  . Drug use: No  . Sexual activity: Never    Partners: Male    Birth control/protection: Post-menopausal  Other Topics Concern  . Not on file  Social History Narrative  . Not on file   Social Determinants of Health   Financial Resource Strain: Not on file  Food Insecurity: Not on file  Transportation Needs: Not on  file  Physical Activity: Not on file  Stress: Not on file  Social Connections: Not on file  Intimate Partner Violence: Not on file    Family History:   The patient's family history includes Breast cancer in her sister; Breast cancer (age of onset: 70) in her daughter; Stroke in her sister.    Physical Exam/Data:   Vitals:   10/28/20 1100 10/28/20 1115 10/28/20 1130 10/28/20 1145  BP: (!) 113/59 126/63 (!) 123/56 (!) 110/59  Pulse: (!) 162 (!) 101 89 80  Resp: 17 19 18 17   Temp:      SpO2: 98% 96% 96% 96%  Weight:      Height:       No intake or output data in the 24 hours ending 10/28/20 1208 Filed Weights   10/28/20 0232  Weight: 68 kg   Body mass index is 28.33 kg/m.  General:  Elderly female in NAD  HEENT: normal Lymph: no adenopathy Neck: noJVD no carotid Bruit Endocrine:  No thryomegaly Vascular: No carotid bruits; FA pulses 2+ bilaterally without bruits  Cardiac:  normal S1, S2; RRR; loud systolic ejection murmur  Lungs:  clear to auscultation bilaterally, no wheezing, rhonchi or rales  Abd: soft, nontender, no hepatomegaly  Ext: no edema Musculoskeletal:  No deformities, BUE and BLE strength normal and equal Skin: warm and dry  Neuro:  CNs 2-12 intact, no focal abnormalities noted Psych:  Normal affect   Seen with heart rate 156 and no carotid bruit:  Carotid Massage performed.  This aborted her tachycardia.  Presently in Sinus rhythm.  EKG:  The ECG that was done  was personally reviewed and demonstrates  SR rate 90 1st HB and LAE  Relevant CV Studies: none  Laboratory Data:  Chemistry Recent Labs  Lab 10/27/20 0318 10/28/20 0234  NA 140 141  K 3.9 3.4*  CL 106 108  CO2 25 22  GLUCOSE 108* 138*  BUN 14 16  CREATININE 0.87 0.96  CALCIUM 8.8* 9.0  GFRNONAA >60 57*  ANIONGAP 9 11    Recent Labs  Lab 10/26/20 1628  PROT 6.2*  ALBUMIN 3.6  AST 18  ALT 15  ALKPHOS 68  BILITOT 0.5   Hematology Recent Labs  Lab 10/27/20 0319  10/28/20 0234  WBC 8.5 12.5*  RBC 4.27 4.56  HGB 12.4 12.9  HCT 37.9 41.9  MCV 88.8 91.9  MCH 29.0 28.3  MCHC 32.7 30.8  RDW 13.7 13.8  PLT 228 236   Cardiac EnzymesNo results for input(s): TROPONINI in the last 168 hours. No results for input(s): TROPIPOC in the last 168 hours.  BNPNo results for input(s): BNP, PROBNP in  the last 168 hours.  DDimer No results for input(s): DDIMER in the last 168 hours.  Radiology/Studies:  DG Chest Port 1 View  Result Date: 10/28/2020 CLINICAL DATA:  Heart racing EXAM: PORTABLE CHEST 1 VIEW COMPARISON:  02/07/2020 FINDINGS: Heart and mediastinal contours are within normal limits. No focal opacities or effusions. No acute bony abnormality. IMPRESSION: No active disease. Electronically Signed   By: Rolm Baptise M.D.   On: 10/28/2020 03:03    Assessment and Plan:   1. SVT- breaking through pindolol; increase to 10 mg PO BID - have started amiodarone 400 mg PO BID with plans to load for SVT suppression; could be candidate for EP Ablation; given her Severe AS have concerns about long term in SVT- continue on telemetry 2. HTN. Continue pindolol 3. HLD. Continue home statin 4. Severe aortic stenosis. Not symtpomatic at this time. Will continue to monitor.   Possible DC 10/29/20 if SVT free  For questions or updates, please contact [place] Please consult [email or web address] for contact info under        Signed, Werner Lean, MD  10/28/2020 12:08 PM

## 2020-10-28 NOTE — ED Notes (Signed)
Pt back into svt.

## 2020-10-28 NOTE — Plan of Care (Signed)
Patient appropriate care plans open, education provided

## 2020-10-28 NOTE — ED Notes (Signed)
Dr Gasper Sells at bedside.

## 2020-10-29 LAB — BASIC METABOLIC PANEL
Anion gap: 9 (ref 5–15)
BUN: 13 mg/dL (ref 8–23)
CO2: 24 mmol/L (ref 22–32)
Calcium: 9 mg/dL (ref 8.9–10.3)
Chloride: 108 mmol/L (ref 98–111)
Creatinine, Ser: 0.74 mg/dL (ref 0.44–1.00)
GFR, Estimated: >60 mL/min (ref >60)
Glucose, Bld: 97 mg/dL (ref 70–99)
Potassium: 4.4 mmol/L (ref 3.5–5.1)
Sodium: 141 mmol/L (ref 135–145)

## 2020-10-29 LAB — CBC
HCT: 37.8 % (ref 36.0–46.0)
Hemoglobin: 12.5 g/dL (ref 12.0–15.0)
MCH: 29.4 pg (ref 26.0–34.0)
MCHC: 33.1 g/dL (ref 30.0–36.0)
MCV: 88.9 fL (ref 80.0–100.0)
Platelets: 227 10*3/uL (ref 150–400)
RBC: 4.25 MIL/uL (ref 3.87–5.11)
RDW: 13.9 % (ref 11.5–15.5)
WBC: 8 10*3/uL (ref 4.0–10.5)
nRBC: 0 % (ref 0.0–0.2)

## 2020-10-29 LAB — GLUCOSE, CAPILLARY: Glucose-Capillary: 84 mg/dL (ref 70–99)

## 2020-10-29 MED ORDER — ALPRAZOLAM 0.5 MG PO TABS: 0.5000 mg | ORAL_TABLET | Freq: Two times a day (BID) | ORAL | Status: DC | PRN

## 2020-10-29 NOTE — Progress Notes (Signed)
   10/29/20 0702  Vitals  Temp 97.9 F (36.6 C)  Temp Source Oral  BP (!) 157/77  MAP (mmHg) 103  BP Method Automatic  Pulse Rate (!) 137  Pulse Rate Source Monitor  Resp 18  Level of Consciousness  Level of Consciousness Alert  MEWS COLOR  MEWS Score Color Yellow  Oxygen Therapy  SpO2 100 %  O2 Device Room Air  MEWS Score  MEWS Temp 0  MEWS Systolic 0  MEWS Pulse 3  MEWS RR 0  MEWS LOC 0  MEWS Score 3  Pt's HR in the 130's sustained, pt c/o palpitations. Dr. Meda Coffee paged, awaiting callback.

## 2020-10-29 NOTE — Progress Notes (Signed)
Cardiology Progress Note:   Patient ID: Kim Ward MRN: [id number]; DOB: [birth year]   Admission date: 10/28/2020  Primary Care Provider: Linus Mako, NP Primary Cardiologist: Kirk Ruths, MD  Primary Electrophysiologist:  None   Chief Complaint:  Palpitations  Patient Profile:   Kim Ward is a 86 y.o. female with a PMH of PSVT, HTN, HLD, hypothyroid, severe AS, who presented with SVT. She was discharged 10/26/20 with AVNTR on BB. Resolved with Carotid Massage 10/28/20.  Amiodarone started 10/28/20 with decrease in intermittent SVT; but still present.  Subjective   Patient notes that she is doing better on this.  Since day prior has still had intermittent SVT.  Palpitations when this occurs.  Patient has significant concerns because she was worried this rhythm was going to kill her.  Medications Prior to Admission: Prior to Admission medications   Medication Sig Start Date End Date Taking? Authorizing Provider  ALPRAZolam Duanne Moron) 0.5 MG tablet Take 0.5 mg by mouth 2 (two) times daily as needed for anxiety or sleep. 06/06/20  Yes [provider]  amoxicillin (AMOXIL) 500 MG capsule Take 500 mg by mouth as directed. 4 capsules before dental procedures   Yes [provider]  BIOTIN PO Take 1 tablet by mouth daily.    Yes [provider]  calcium-vitamin D (OSCAL WITH D) 500-200 MG-UNIT tablet Take 1 tablet by mouth daily with breakfast.   Yes [provider]  ibuprofen (ADVIL) 200 MG tablet Take 400 mg by mouth every 6 (six) hours as needed. For pain   Yes [provider]  loratadine (CLARITIN) 10 MG tablet Take 10 mg by mouth daily.   Yes [provider]  pindolol (VISKEN) 5 MG tablet Take 1 tablet (5 mg total) by mouth 2 (two) times daily. 10/27/20  Yes Kroeger, Daleen Snook M., PA-C  potassium chloride (KLOR-CON) 10 MEQ tablet Take 10 mEq by mouth daily. 03/19/17  Yes [provider]  rosuvastatin (CRESTOR) 5 MG tablet Take  5 mg by mouth every Monday, Wednesday, and Friday. 02/21/20  Yes [provider]  traMADol (ULTRAM) 50 MG tablet Take 25-100 mg by mouth every 6 (six) hours as needed for moderate pain. 01/26/19  Yes [provider]  zolpidem (AMBIEN) 5 MG tablet Take 5 mg by mouth at bedtime as needed for sleep.   Yes [provider]     Allergies:    Allergies  Allergen Reactions  . Augmentin [Amoxicillin-Pot Clavulanate]   . Azithromycin     Or any drug that ends in "mycin"  . Bextra [Valdecoxib]     Unknown  . Cefuroxime Other (See Comments)    dizziness  . Cefuroxime Axetil     Severe dizziness  . Etodolac Other (See Comments)    Reaction unknown  . Humibid La [Guaifenesin] Other (See Comments)    Mouth sores  . Lipitor [Atorvastatin Calcium]     MYALGIAS  . Ramipril Cough  . Risedronate Sodium      GI ISSUES  . Tramadol Other (See Comments)    Vomiting and diarrhea  . Zocor [Simvastatin]     MYALGIAS  . Niaspan [Niacin Er] Rash    Social History:   Social History   Socioeconomic History  . Marital status: Widowed    Spouse name: Not on file  . Number of children: 2  . Years of education: Not on file  . Highest education level: Not on file  Occupational History  . Occupation: Retired  Tobacco  Use  . Smoking status: Former Smoker    Quit date: 06/17/1972    Years since quitting: 48.4  . Smokeless tobacco: Never Used  . Tobacco comment: quit June 17, 1972  Vaping Use  . Vaping Use: Never used  Substance and Sexual Activity  . Alcohol use: Yes    Alcohol/week: 0.0 standard drinks    Comment: 2 drinks per month  . Drug use: No  . Sexual activity: Never    Partners: Male    Birth control/protection: Post-menopausal  Other Topics Concern  . Not on file  Social History Narrative  . Not on file   Social Determinants of Health   Financial Resource Strain: Not on file  Food Insecurity: Not on file  Transportation Needs: Not on file  Physical  Activity: Not on file  Stress: Not on file  Social Connections: Not on file  Intimate Partner Violence: Not on file    Family History:   The patient's family history includes Breast cancer in her sister; Breast cancer (age of onset: 71) in her daughter; Stroke in her sister.    Physical Exam/Data:   Vitals:   10/29/20 0702 10/29/20 0715 10/29/20 0725 10/29/20 0750  BP: (!) 157/77 132/68 132/68 126/63  Pulse: (!) 137 77 77 94  Resp: 18 16 16 18   Temp: 97.9 F (36.6 C) 97.7 F (36.5 C) 97.7 F (36.5 C) 97.7 F (36.5 C)  TempSrc: Oral   Oral  SpO2: 100%   96%  Weight:      Height:        Intake/Output Summary (Last 24 hours) at 10/29/2020 1047 Last data filed at 10/29/2020 0900 Gross per 24 hour  Intake 840 ml  Output 650 ml  Net 190 ml   Filed Weights   10/28/20 0232 10/29/20 0533  Weight: 68 kg 68.2 kg   Body mass index is 28.4 kg/m.  General:  Elderly female in NAD  Neck: no JVD Vascular: No carotid bruits; FA pulses 2+ bilaterally without bruits  Cardiac:  normal S1, S2; regular rhythm with bradycardia; loud systolic ejection murmur  Lungs:  clear to auscultation bilaterally, no wheezing, rhonchi or rales  Abd: soft, nontender, no hepatomegaly  Ext: no edema Psych:  Normal affect   EKG:  No new personally reviewed Telemetry:  Persistent salvos of SVT; though improving from prior in frequency and duration  Relevant CV Studies: none  Laboratory Data:  Chemistry Recent Labs  Lab 10/28/20 0234 10/29/20 0300  NA 141 141  K 3.4* 4.4  CL 108 108  CO2 22 24  GLUCOSE 138* 97  BUN 16 13  CREATININE 0.96 0.74  CALCIUM 9.0 9.0  GFRNONAA 57* >60  ANIONGAP 11 9    Recent Labs  Lab 10/26/20 1628  PROT 6.2*  ALBUMIN 3.6  AST 18  ALT 15  ALKPHOS 68  BILITOT 0.5   Hematology Recent Labs  Lab 10/28/20 0234 10/29/20 0300  WBC 12.5* 8.0  RBC 4.56 4.25  HGB 12.9 12.5  HCT 41.9 37.8  MCV 91.9 88.9  MCH 28.3 29.4  MCHC 30.8 33.1  RDW 13.8 13.9   PLT 236 227   Cardiac EnzymesNo results for input(s): TROPONINI in the last 168 hours. No results for input(s): TROPIPOC in the last 168 hours.  BNPNo results for input(s): BNP, PROBNP in the last 168 hours.  DDimer No results for input(s): DDIMER in the last 168 hours.  Radiology/Studies:  No results found.  Assessment and Plan:  SVT - pindolol increased to 10 mg PO BID PM of 10/28/20 - started amiodarone 400 mg PO BID with plans to load for SVT suppression; could be candidate for EP Ablation; given her Severe AS have concerns about long term in SVT- continue on telemetry - if symptomatic SVT returns that cannot be abated by vagal maneuvers or adenosine; reasonable to load IV amiodarone (have not done this presently in the setting hypotension and severe AS concern) - EP to see 10/30/20 - HTN. Continue pindolol - HLD. Continue home statin - Severe aortic stenosis. Not symtpomatic at this time.  Time Spent Directly with Patient:   I have spent a total of 35 with the patient reviewing hospital notes, telemetry, EKGs, labs and examining the patient as well as establishing an assessment and plan that was discussed personally with the patient.  > 50% of time was spent in direct patient care discussing the mortality risks associated with SVT; that this was not likely to kill her.  For questions or updates, please contact [place] Please consult [email or web address] for contact info under      Signed, Werner Lean, MD  10/29/2020 10:47 AM

## 2020-10-29 NOTE — Progress Notes (Signed)
   10/29/20 0533  Vitals  Temp 97.7 F (36.5 C)  Temp Source Oral  BP (!) 150/55  MAP (mmHg) 81  BP Location Right Arm  BP Method Automatic  Patient Position (if appropriate) Lying  Pulse Rate 65  Pulse Rate Source Monitor  Resp 17  MEWS COLOR  MEWS Score Color Green  Oxygen Therapy  SpO2 96 %  O2 Device Room Air  Height and Weight  Weight 68.2 kg  Type of Scale Used Standing (scale c)  Type of Weight Actual  BMI (Calculated) 28.41  MEWS Score  MEWS Temp 0  MEWS Systolic 0  MEWS Pulse 0  MEWS RR 0  MEWS LOC 0  MEWS Score 0  Pt c/o dizziness this morning, vital signs WNL. Will continue to monitor.

## 2020-10-29 NOTE — Progress Notes (Signed)
Patient alert and oriented x 4 today, despite some confusion during night hours. Resps even and unlabored, lung sounds clear throughout. HR was irregular first thing this am when she was experiencing tachycardia, but has since normalized and became regular. Bowel sounds active throughout and patient had a large formed BM this morning. She uses the bedside commode to urinate and calls for assist as needed. She was set up to wash her face and brush her teeth as well today, which she was able to perform without difficulty or assistance. She has a steady gait and only needs stand by assist for reassurance. Patient does not show any signs of edema to BLE or abdomen and she denies pain or discomfort. She has not had a recurrence of the tachycardia and weak feeling of this morning. Cardiologist states that the plan is to continue loading with PO amiodarone and pindolol and to notify his team if she experiences any SVT that does not self convert, as there will likely be a need for adenosine or carotid massage. Otherwise, call light is in reach, able and encouraged to make needs known.

## 2020-10-29 NOTE — Plan of Care (Signed)
  Problem: Education: Goal: Knowledge of General Education information will improve Description: Including pain rating scale, medication(s)/side effects and non-pharmacologic comfort measures Outcome: Progressing   Problem: Health Behavior/Discharge Planning: Goal: Ability to manage health-related needs will improve Outcome: Progressing   Problem: Clinical Measurements: Goal: Ability to maintain clinical measurements within normal limits will improve Outcome: Progressing Goal: Will remain free from infection Outcome: Progressing Goal: Diagnostic test results will improve Outcome: Progressing Goal: Cardiovascular complication will be avoided Outcome: Progressing   Problem: Activity: Goal: Risk for activity intolerance will decrease Outcome: Progressing   Problem: Coping: Goal: Level of anxiety will decrease Outcome: Progressing   Problem: Elimination: Goal: Will not experience complications related to bowel motility Outcome: Progressing Goal: Will not experience complications related to urinary retention Outcome: Progressing   Problem: Pain Managment: Goal: General experience of comfort will improve Outcome: Progressing   Problem: Safety: Goal: Ability to remain free from injury will improve Outcome: Progressing   Problem: Skin Integrity: Goal: Risk for impaired skin integrity will decrease Outcome: Progressing   Problem: Education: Goal: Ability to manage disease process will improve Outcome: Progressing Goal: Individualized Educational Video(s) Outcome: Progressing   Problem: Cardiac: Goal: Ability to achieve and maintain adequate cardiopulmonary perfusion will improve Outcome: Progressing

## 2020-10-30 DIAGNOSIS — R079 Chest pain, unspecified: Secondary | ICD-10-CM

## 2020-10-30 LAB — BASIC METABOLIC PANEL
Anion gap: 9 (ref 5–15)
BUN: 14 mg/dL (ref 8–23)
CO2: 27 mmol/L (ref 22–32)
Calcium: 8.9 mg/dL (ref 8.9–10.3)
Chloride: 106 mmol/L (ref 98–111)
Creatinine, Ser: 0.95 mg/dL (ref 0.44–1.00)
GFR, Estimated: 58 mL/min — ABNORMAL LOW (ref >60)
Glucose, Bld: 97 mg/dL (ref 70–99)
Potassium: 4.1 mmol/L (ref 3.5–5.1)
Sodium: 142 mmol/L (ref 135–145)

## 2020-10-30 LAB — CBC
HCT: 40.8 % (ref 36.0–46.0)
Hemoglobin: 13.4 g/dL (ref 12.0–15.0)
MCH: 29.3 pg (ref 26.0–34.0)
MCHC: 32.8 g/dL (ref 30.0–36.0)
MCV: 89.1 fL (ref 80.0–100.0)
Platelets: 246 10*3/uL (ref 150–400)
RBC: 4.58 MIL/uL (ref 3.87–5.11)
RDW: 13.7 % (ref 11.5–15.5)
WBC: 7.7 10*3/uL (ref 4.0–10.5)
nRBC: 0 % (ref 0.0–0.2)

## 2020-10-30 LAB — TROPONIN I (HIGH SENSITIVITY)
Troponin I (High Sensitivity): 19 ng/L — ABNORMAL HIGH (ref <18)
Troponin I (High Sensitivity): 15 ng/L (ref <18)

## 2020-10-30 NOTE — Progress Notes (Addendum)
Patient reporting complaints of chest pressure 5/10 that radiates from her chest to her shoulder to her head. EKG completed and vitals taken BP 122/39 HR 91. 2L of O2 also placed on patient for comfort.   Dr. Debara Pickett paged but states that he is not the attending MD for patient and and informed RN to call B team Cardiology. B Team Glenford Peers, NP paged.  Amiodarone given to patient. Maalox also provided to patient.   RN will change patient to a progressive monitor at bedside.   0850 after amiodarone provided patient states that she is at 2/10.   RN will continue to monitor.  0900: NP at bedside to see patient.

## 2020-10-30 NOTE — Consult Note (Signed)
ELECTROPHYSIOLOGY CONSULT NOTE    Patient ID: Kim Ward MRN: [id number], DOB/AGE: 02-04-1931 86 y.o.  Admit date: 10/28/2020 Date of Consult: 10/30/2020  Primary Physician: Linus Mako, NP Primary Cardiologist: Kirk Ruths, MD  Electrophysiologist: New (previously seen by Dr. Caryl Comes during an admission in 09/2012)  Referring Provider: Dr. Gardiner Rhyme  Patient Profile: Kim Ward is a 86 y.o. female with a history of PSVT, HTN, HLD, hypothyroid, severe AS who is being seen today for the evaluation of SVT at the request of Dr. Gardiner Rhyme.  HPI:  Kim Ward is a 86 y.o. female with medical history above.   Pt recently admitted 10/26/2020 with sudden heart racing. Found to be in SVT that "responded to adenosine en route via EMS" but recurred. Given IV metoprolol in ED with resolution. She has a history of same, but it had been "many years".  She was discharged on BB with notes to "consider EP referral if recurrent for SVT ablation consideration".   Pt presented back to ED 12/11 with symptomatic tachy-palpitations. Denied chest pain, just lightheadedness and heart racing. She spontaneously converted to NSR. She went back into SVT in ED and received 6 mg adenosine with conversion to NSR.   Very distantly she was seen by Dr. Caryl Comes in 09/2012 for SVT. Discharged on verapamil.   Followed by Dr. Stanford Breed now for AS, PSVT, HTN, and HLD. Echo 08/25/2020 showed normal LVEF, severe AS. Referred to Dr. Leeroy Bock for consideration of cath +/- TAVR.   Labs on admission include WBC 8.5, Hgb 13.7, K 4.4, Cr 0.87. COVID negative. HS trop 184 -> 115 -> 41 -> 52.   She had an episode of chest pressure this am and troponin is pending.  She has felt a lot better on amiodarone. She has a long history of these tachy palpitations and is very familiar with the symptoms. She denies any current chest pain, palpitations, orthopnea, N/V, or diarrhea.   Past Medical History:  Diagnosis Date  .  Adenomatous colon polyp   . Anginal pain ([place])   . Aortic stenosis   . Depression    Tx'd 19 yrs ago when husband passed away  . Diverticulosis   . Dysrhythmia   . Heart murmur   . Hyperlipidemia   . Hypertension   . Hypothyroidism    Dr. Dwyane Dee follows pt.--had small nodules but had decreased in size and stopped Synthroid 06/2013  . Osteopenia   . Osteoporosis   . Pure hypercholesterolemia   . SVT (supraventricular tachycardia) [facility])      Surgical History:  Past Surgical History:  Procedure Laterality Date  . CERVICAL BIOPSY  W/ LOOP ELECTRODE EXCISION  1996   CIN I  . COLONOSCOPY  03/2001, 01/2007   2008 - no polyps  . DILATION AND CURETTAGE OF UTERUS    . TONSILLECTOMY       Medications Prior to Admission  Medication Sig Dispense Refill Last Dose  . ALPRAZolam (XANAX) 0.5 MG tablet Take 0.5 mg by mouth 2 (two) times daily as needed for anxiety or sleep.   unk  . amoxicillin (AMOXIL) 500 MG capsule Take 500 mg by mouth as directed. 4 capsules before dental procedures   unk  . BIOTIN PO Take 1 tablet by mouth daily.    Past Week at Unknown time  . calcium-vitamin D (OSCAL WITH D) 500-200 MG-UNIT tablet Take 1 tablet by mouth daily with breakfast.   Past Week at Unknown time  . ibuprofen (ADVIL) 200  MG tablet Take 400 mg by mouth every 6 (six) hours as needed. For pain   unk  . loratadine (CLARITIN) 10 MG tablet Take 10 mg by mouth daily.   Past Week at Unknown time  . pindolol (VISKEN) 5 MG tablet Take 1 tablet (5 mg total) by mouth 2 (two) times daily. 60 tablet 6 10/27/2020 at Unknown time  . potassium chloride (KLOR-CON) 10 MEQ tablet Take 10 mEq by mouth daily.   Past Week at Unknown time  . rosuvastatin (CRESTOR) 5 MG tablet Take 5 mg by mouth every Monday, Wednesday, and Friday.   Past Week at Unknown time  . traMADol (ULTRAM) 50 MG tablet Take 25-100 mg by mouth every 6 (six) hours as needed for moderate pain.   unk  . zolpidem (AMBIEN) 5 MG tablet Take 5 mg by mouth at  bedtime as needed for sleep.   unk    Inpatient Medications:  . amiodarone  400 mg Oral BID  . calcium-vitamin D  1 tablet Oral Q breakfast  . enoxaparin (LOVENOX) injection  40 mg Subcutaneous Q24H  . pindolol  10 mg Oral BID  . rosuvastatin  5 mg Oral Q M,W,F    Allergies:  Allergies  Allergen Reactions  . Augmentin [Amoxicillin-Pot Clavulanate]   . Azithromycin     Or any drug that ends in "mycin"  . Bextra [Valdecoxib]     Unknown  . Cefuroxime Other (See Comments)    dizziness  . Cefuroxime Axetil     Severe dizziness  . Etodolac Other (See Comments)    Reaction unknown  . Humibid La [Guaifenesin] Other (See Comments)    Mouth sores  . Lipitor [Atorvastatin Calcium]     MYALGIAS  . Ramipril Cough  . Risedronate Sodium      GI ISSUES  . Tramadol Other (See Comments)    Vomiting and diarrhea  . Zocor [Simvastatin]     MYALGIAS  . Niaspan [Niacin Er] Rash    Social History   Socioeconomic History  . Marital status: Widowed    Spouse name: Not on file  . Number of children: 2  . Years of education: Not on file  . Highest education level: Not on file  Occupational History  . Occupation: Retired  Tobacco Use  . Smoking status: Former Smoker    Quit date: 06/17/1972    Years since quitting: 48.4  . Smokeless tobacco: Never Used  . Tobacco comment: quit June 17, 1972  Vaping Use  . Vaping Use: Never used  Substance and Sexual Activity  . Alcohol use: Yes    Alcohol/week: 0.0 standard drinks    Comment: 2 drinks per month  . Drug use: No  . Sexual activity: Never    Partners: Male    Birth control/protection: Post-menopausal  Other Topics Concern  . Not on file  Social History Narrative  . Not on file   Social Determinants of Health   Financial Resource Strain: Not on file  Food Insecurity: Not on file  Transportation Needs: Not on file  Physical Activity: Not on file  Stress: Not on file  Social Connections: Not on file  Intimate Partner  Violence: Not on file     Family History  Problem Relation Age of Onset  . Stroke Sister   . Breast cancer Sister   . Breast cancer Daughter 23       metastatic breast ca     Review of Systems: All other systems reviewed and  are otherwise negative except as noted above.  Physical Exam: Vitals:   10/30/20 0355 10/30/20 0729 10/30/20 0831 10/30/20 0843  BP: (!) 150/52 (!) 155/49 (!) 122/39 (!) 142/56  Pulse:  63 68 89  Resp: 16 16 20 16   Temp: 98.1 F (36.7 C) 97.7 F (36.5 C)    TempSrc: Oral Oral    SpO2: 98% 100% 98% 100%  Weight:      Height:        GEN- The patient is well appearing, alert and oriented x 3 today.   HEENT: normocephalic, atraumatic; sclera clear, conjunctiva pink; hearing intact; oropharynx clear; neck supple Lungs- Clear to ausculation bilaterally, normal work of breathing.  No wheezes, rales, rhonchi Heart- Regular rate and rhythm, no murmurs, rubs or gallops GI- soft, non-tender, non-distended, bowel sounds present Extremities- no clubbing, cyanosis, or edema; DP/PT/radial pulses 2+ bilaterally MS- no significant deformity or atrophy Skin- warm and dry, no rash or lesion Psych- euthymic mood, full affect Neuro- strength and sensation are intact  Labs:   Lab Results  Component Value Date   WBC 7.7 10/30/2020   HGB 13.4 10/30/2020   HCT 40.8 10/30/2020   MCV 89.1 10/30/2020   PLT 246 10/30/2020    Recent Labs  Lab 10/26/20 1628 10/27/20 0318 10/30/20 0712  NA 138   < > 142  K 4.4   < > 4.1  CL 105   < > 106  CO2 23   < > 27  BUN 13   < > 14  CREATININE 0.87   < > 0.95  CALCIUM 9.1   < > 8.9  PROT 6.2*  --   --   BILITOT 0.5  --   --   ALKPHOS 68  --   --   ALT 15  --   --   AST 18  --   --   GLUCOSE 95   < > 97   < > = values in this interval not displayed.     Radiology/Studies: DG Chest Port 1 View  Result Date: 10/28/2020 CLINICAL DATA:  Heart racing EXAM: PORTABLE CHEST 1 VIEW COMPARISON:  02/07/2020 FINDINGS: Heart  and mediastinal contours are within normal limits. No focal opacities or effusions. No acute bony abnormality. IMPRESSION: No active disease. Electronically Signed   By: Rolm Baptise M.D.   On: 10/28/2020 03:03    EKG: 12/9 shows WCT (QRS 123 ms) at 155 bpm (personally reviewed)  12/11 shows sinus tach at 106 bpm with PVCs, QRS 144 ms 12/13 shows NSR with 1st degree AV block at 71 bpm, PR interval 218 ms, QRS 90 ms  TELEMETRY: NSR 60-70s, occasional SVTs in 130-150s but increasingly quiescent since starting on amiodarone. (personally reviewed)  Assessment/Plan: 1.  Paroxysmal SVT Has failed BB alone Pt now on amiodarone 400 mg BID. Would continue loading.  Continue propanolol.  Mild 1st degree AV block at baseline. Follow.  Pt would prefer to avoid invasive procedures if possible.   2. Severe AS Echo 08/2020 LVEF normal with severe AS; ultimately planning for cath with consideration of TAVR.  Per primary team.   Will make outpatient EP follow up.   For questions or updates, please contact [place] Please consult [email or web address] for contact info under Cardiology/STEMI.  Jacalyn Lefevre, PA-C  10/30/2020 10:47 AM

## 2020-10-30 NOTE — Progress Notes (Addendum)
Progress Note  Patient Name: Kim Ward Date of Encounter: 10/30/2020  Primary Cardiologist: Kirk Ruths, MD   Subjective   Pt had an episode of chest pressure this AM. On my arrival, chest pain had resolved. Amiodarone given early along with Maalox per RN. Otherwise no specific complaints today   Inpatient Medications    Scheduled Meds: . amiodarone  400 mg Oral BID  . calcium-vitamin D  1 tablet Oral Q breakfast  . enoxaparin (LOVENOX) injection  40 mg Subcutaneous Q24H  . pindolol  10 mg Oral BID  . rosuvastatin  5 mg Oral Q M,W,F   Continuous Infusions:  PRN Meds: acetaminophen, ALPRAZolam, alum & mag hydroxide-simeth, loperamide, magnesium hydroxide, ondansetron (ZOFRAN) IV, zolpidem   Vital Signs    Vitals:   10/30/20 0355 10/30/20 0729 10/30/20 0831 10/30/20 0843  BP: (!) 150/52 (!) 155/49 (!) 122/39 (!) 142/56  Pulse:  63 68 89  Resp: 16 16 20 16   Temp: 98.1 F (36.7 C) 97.7 F (36.5 C)    TempSrc: Oral Oral    SpO2: 98% 100% 98% 100%  Weight:      Height:        Intake/Output Summary (Last 24 hours) at 10/30/2020 0911 Last data filed at 10/30/2020 0700 Gross per 24 hour  Intake 240 ml  Output 1550 ml  Net -1310 ml   Filed Weights   10/28/20 0232 10/29/20 0533 10/30/20 0116  Weight: 68 kg 68.2 kg 67.6 kg    Physical Exam   General: Elderly, NAD Neck: Negative for carotid bruits. No JVD Lungs:Clear to ausculation bilaterally. No wheezes, rales, or rhonchi. Breathing is unlabored. Cardiovascular: RRR with S1 S2. + murmur Abdomen: Soft, non-tender, non-distended. No obvious abdominal masses. Extremities: No edema. Radial pulses 2+ bilaterally Neuro: Alert and oriented. No focal deficits. No facial asymmetry. MAE spontaneously. Psych: Responds to questions appropriately with normal affect.    Labs    Chemistry Recent Labs  Lab 10/26/20 1628 10/27/20 0318 10/28/20 0234 10/29/20 0300 10/30/20 0712  NA 138   < > 141 141 142  K  4.4   < > 3.4* 4.4 4.1  CL 105   < > 108 108 106  CO2 23   < > 22 24 27   GLUCOSE 95   < > 138* 97 97  BUN 13   < > 16 13 14   CREATININE 0.87   < > 0.96 0.74 0.95  CALCIUM 9.1   < > 9.0 9.0 8.9  PROT 6.2*  --   --   --   --   ALBUMIN 3.6  --   --   --   --   AST 18  --   --   --   --   ALT 15  --   --   --   --   ALKPHOS 68  --   --   --   --   BILITOT 0.5  --   --   --   --   GFRNONAA >60   < > 57* >60 58*  ANIONGAP 10   < > 11 9 9    < > = values in this interval not displayed.     Hematology Recent Labs  Lab 10/28/20 0234 10/29/20 0300 10/30/20 0712  WBC 12.5* 8.0 7.7  RBC 4.56 4.25 4.58  HGB 12.9 12.5 13.4  HCT 41.9 37.8 40.8  MCV 91.9 88.9 89.1  MCH 28.3 29.4 29.3  MCHC 30.8  33.1 32.8  RDW 13.8 13.9 13.7  PLT 236 227 246    Cardiac EnzymesNo results for input(s): TROPONINI in the last 168 hours. No results for input(s): TROPIPOC in the last 168 hours.   BNPNo results for input(s): BNP, PROBNP in the last 168 hours.   DDimer No results for input(s): DDIMER in the last 168 hours.   Radiology    No results found.  Telemetry    10/30/20 NSR/ST- Personally Reviewed  ECG    10/30/20 NSR with no acute changes- Personally Reviewed  Cardiac Studies   Echocardiogram 08/25/20:  1. Left ventricular ejection fraction, by estimation, is 60 to 65%. The  left ventricle has normal function. The left ventricle has no regional  wall motion abnormalities. There is mild left ventricular hypertrophy.  Left ventricular diastolic parameters  are consistent with Grade II diastolic dysfunction (pseudonormalization).  2. Right ventricular systolic function is normal. The right ventricular  size is normal.  3. The mitral valve is normal in structure. Mild mitral valve  regurgitation. No evidence of mitral stenosis.  4. Gradient is less this time as compare to echocardiogram from  12/30/2019. Most likely missed higher velocity jet of AS at this study. All  other parameters  indicate severe AS. The aortic valve is normal in  structure. Aortic valve regurgitation is mild.  Severe aortic valve stenosis. Aortic valve mean gradient measures 30.7  mmHg.  5. The inferior vena cava is normal in size with greater than 50%  respiratory variability, suggesting right atrial pressure of 3 mmHg.    Lexiscan 10/22/2012:  Normal stress test with no ischemia or infarct   Patient Profile     86 y.o. female with a PMH of PSVT, HTN, HLD, hypothyroid, severe AS, who presented with SVT. She was discharged 10/26/20 with AVNTR on BB. Resolved with Carotid Massage 10/28/20.  Amiodarone started 10/28/20 with decrease in intermittent SVT; but still present.  Assessment & Plan    1. SVT: -Pindolol increased to 10mg  and pt started on Amiodarone 400mg  PO BID with plans to load for SVT suppression>>>felt to possibly be a candidate for SVT ablation  -Plan for EP to see today per weekend notes  2. Severe AS: -Last echo 08/25/2020 with gradient measuring less than prior study from  12/30/2019 felt to be a higher velocity jet of AS>>All  other parameters indicate severe AS with a mean gradient measures 30.7  mmHg.  -? TAVR candidate once HR more controlled   3. Chest pain: -Pt had an episode of chest pain this AM>>>amio and Maalox given by RN -EKG with no acute changes -Pt has no prior hx of CAD>>Lexiscan stress test 2013 with no ischemia or infarct  -Obtain HST for completion  -Concerning given hx of severe AS>>HR's at the time of chest pressure were in the 90's with a more recent baseline in the 60's    Signed, Kathyrn Drown NP-C [place] Pager: [phone] 10/30/2020, 9:11 AM    For questions or updates, please contact   Please consult [email or web address] for contact info under Cardiology/STEMI.  Patient seen and examined.  Agree with above documentation.  On exam, patient is alert and oriented, regular rate and PPIRJJ,8/8 systolic murmur, lungs CTAB, no LE edema or JVD.   Started on amiodarone load over the weekend, with plans for EP to see today.  Will follow up recs.  Episode of chest pain this morning, EKG with no acute changes.  We will follow up troponins.  Donato Heinz, MD

## 2020-10-31 ENCOUNTER — Ambulatory Visit (INDEPENDENT_AMBULATORY_CARE_PROVIDER_SITE_OTHER): Payer: Medicare PPO

## 2020-10-31 DIAGNOSIS — I471 Supraventricular tachycardia: Secondary | ICD-10-CM

## 2020-10-31 MED ORDER — AMLODIPINE BESYLATE 2.5 MG PO TABS: 2.5000 mg | ORAL_TABLET | Freq: Every day | ORAL | 11 refills | Status: AC

## 2020-10-31 MED ORDER — AMIODARONE HCL 200 MG PO TABS: ORAL_TABLET | ORAL | 0 refills | Status: DC

## 2020-10-31 MED ORDER — PINDOLOL 10 MG PO TABS: 10.0000 mg | ORAL_TABLET | Freq: Two times a day (BID) | ORAL | 6 refills | Status: DC

## 2020-10-31 MED ORDER — AMIODARONE HCL 200 MG PO TABS: 200.0000 mg | ORAL_TABLET | Freq: Every day | ORAL | 3 refills | Status: DC

## 2020-10-31 NOTE — Discharge Instructions (Signed)
.   Call [place] with any questions during wear 24/7: [phone] . Zio patch should be worn for 14 days unless otherwise instructed . The [facility]) will call you 24-48 hrs. after Kim Ward is applied, be sure to answer the phone from a 224 area code.  They may call during wear with important information regarding your heart, so answer any calls from [employer] . Do not shower for 24 hours after Zio is applied (sponge bath is fine, just keep the patch dry) . After that, when showering avoid direct shower stream of water onto Zio patch, pat dry around the patch . Avoid excessive sweating . Push the button when you are feeling any cardiac symptoms and record them in the logbook or on the Zio app . Refer to the removal date listed on the front of the symptom logbook and remove Zio patch according to the instructions in the back of the logbook . Place Zio patch inside transmitter (sticky side facing up, button facing down) and put both the gateway and symptom logbook in the prepaid mailing envelope included in the back of the transmitter.  Place inside mailbox or any USPS mailbox

## 2020-10-31 NOTE — Discharge Summary (Signed)
Discharge Summary    Patient ID: Kim Ward MRN: [id number]; DOB: [birth year]  Admit date: 10/28/2020 Discharge date: 10/31/2020  Primary Care Provider: Linus Mako, NP  Primary Cardiologist: Kirk Ruths, MD  Primary Electrophysiologist:  Vickie Epley, MD   Discharge Diagnoses    Principal Problem:   SVT (supraventricular tachycardia) ([place]) Active Problems:   Hypercholesteremia   Severe aortic stenosis   Benign hypertensive heart disease without heart failure   Diagnostic Studies/Procedures    None this admission    History of Present Illness     Kim Ward is a 86 y.o. female with hx of PSVT, HTN, HLD, hypothyroid, severe AS presented with recurrent SVT.   Admitted 10/26/20-10/27/20 for SVT. She was presented with sudden onset heart racing. She denied chest pain, SOB, dizziness, or lightheadedness. Given adenosine 6mg  and 12mg  with recurrent SVT. Ultimately converted to sinus rhythm after arrival to the ED where she received metoprolol 5mg  IV. She was monitored overnight without recurrence. She was transitioned from home bisoprolol to pindolol for HR control. Home antihypertensive held due to soft blood pressure. She was discharge home 10/27/2020 however presented back after few hours of discharge with recurrent heart racing.  She spontaneously converted to NSR. She went back into SVT in ED and received 6 mg adenosine with conversion to NSR.   Hospital Course     Consultants: EP  1. SVT - She was admitted and her pindolol increased to 10mg  BID and loaded with amiodarone over weekend for SVT suppression. The patient was seen by Dr. Quentin Ore who recommended amiodarone 400 mg by mouth twice daily for a total of 7 days.  Then decrease to 400 mg by mouth once daily for 7 days.  Then decrease to 200 mg once by mouth daily.  Plan to see EP as outpatient. Dr. Gardiner Rhyme recommended 2 weeks of Zio AT at discharge.   2. Severe AS - Spoke with Nell Range who will add  patient to structural heart team as outpatient  3. Chest pain  - Felt atypical during admit. Troponin negative. No work up recommended.   4. HTN - Her home antihypertensive was discontinued on last admit discharge (Amlodipine 2.5mg  qd, Losartan 50mg  qd and Triam/HCTZ 37.5-25mg  qd) - BP high this admission>> will restart Amlodipine 2.5mg  qd. Advised to keep log.   Did the patient have an acute coronary syndrome (MI, NSTEMI, STEMI, etc) this admission?:  No                               Did the patient have a percutaneous coronary intervention (stent / angioplasty)?:  No.    Discharge Vitals Blood pressure (!) 161/47, pulse 65, temperature 97.7 F (36.5 C), temperature source Oral, resp. rate 13, height 5\' 1"  (1.549 m), weight 67.3 kg, last menstrual period 11/19/1983, SpO2 98 %.  Filed Weights   10/29/20 0533 10/30/20 0116 10/31/20 0530  Weight: 68.2 kg 67.6 kg 67.3 kg    Labs & Radiologic Studies    CBC Recent Labs    10/29/20 0300 10/30/20 0712  WBC 8.0 7.7  HGB 12.5 13.4  HCT 37.8 40.8  MCV 88.9 89.1  PLT 227 275   Basic Metabolic Panel Recent Labs    10/29/20 0300 10/30/20 0712  NA 141 142  K 4.4 4.1  CL 108 106  CO2 24 27  GLUCOSE 97 97  BUN 13 14  CREATININE 0.74 0.95  CALCIUM  9.0 8.9   High Sensitivity Troponin:   Recent Labs  Lab 10/27/20 0318 10/28/20 0234 10/28/20 0501 10/30/20 1209 10/30/20 1842  TROPONINIHS 115* 41* 52* 19* 15    _____________  DG Chest Port 1 View  Result Date: 10/28/2020 CLINICAL DATA:  Heart racing EXAM: PORTABLE CHEST 1 VIEW COMPARISON:  02/07/2020 FINDINGS: Heart and mediastinal contours are within normal limits. No focal opacities or effusions. No acute bony abnormality. IMPRESSION: No active disease. Electronically Signed   By: Rolm Baptise M.D.   On: 10/28/2020 03:03   Disposition   Pt is being discharged home today in good condition.  Follow-up Plans & Appointments     Follow-up Information    Lelon Perla, MD. Go on 12/06/2020.   Specialty: Cardiology Why: @11am  for hospital follow up Contact information: [address] [place] [address] [phone]        Sherren Mocha, MD. Go on 10/30/2020.   Specialty: Cardiology Why: @ 3:40pm to discuss your aortic valve  Contact information: [address] [place] [address] [phone]              Discharge Instructions    Diet - low sodium heart healthy   Complete by: As directed    Discharge instructions   Complete by: As directed    Keep log of BP. If persistently above 140/90, give Dr. Jacalyn Lefevre office a call   Increase activity slowly   Complete by: As directed       Discharge Medications   Allergies as of 10/31/2020      Reactions   Augmentin [amoxicillin-pot Clavulanate]    Azithromycin    Or any drug that ends in "mycin"   Bextra [valdecoxib]    Unknown   Cefuroxime Other (See Comments)   dizziness   Cefuroxime Axetil    Severe dizziness   Etodolac Other (See Comments)   Reaction unknown   Humibid La [guaifenesin] Other (See Comments)   Mouth sores   Lipitor [atorvastatin Calcium]    MYALGIAS   Ramipril Cough   Risedronate Sodium     GI ISSUES   Tramadol Other (See Comments)   Vomiting and diarrhea   Zocor [simvastatin]    MYALGIAS   Niaspan [niacin Er] Rash      Medication List    STOP taking these medications   potassium chloride 10 MEQ tablet Commonly known as: KLOR-CON     TAKE these medications   ALPRAZolam 0.5 MG tablet Commonly known as: XANAX Take 0.5 mg by mouth 2 (two) times daily as needed for anxiety or sleep.   amiodarone 200 MG tablet Commonly known as: PACERONE Take 2 tablets (400mg ) twice daily for 7 days then 2 table (400mg ) daily for 7 days than 1 table (200mg ) daily   amiodarone 200 MG tablet Commonly known as: Pacerone Take 1 tablet (200 mg total) by mouth daily. Start taking on: December 01, 2020   amLODipine 2.5 MG  tablet Commonly known as: NORVASC Take 1 tablet (2.5 mg total) by mouth daily.   amoxicillin 500 MG capsule Commonly known as: AMOXIL Take 500 mg by mouth as directed. 4 capsules before dental procedures   BIOTIN PO Take 1 tablet by mouth daily.   calcium-vitamin D 500-200 MG-UNIT tablet Commonly known as: OSCAL WITH D Take 1 tablet by mouth daily with breakfast.   ibuprofen 200 MG tablet Commonly known as: ADVIL Take 400 mg by mouth every 6 (six) hours  as needed. For pain   loratadine 10 MG tablet Commonly known as: CLARITIN Take 10 mg by mouth daily.   pindolol 10 MG tablet Commonly known as: VISKEN Take 1 tablet (10 mg total) by mouth 2 (two) times daily. What changed:   medication strength  how much to take   rosuvastatin 5 MG tablet Commonly known as: CRESTOR Take 5 mg by mouth every Monday, Wednesday, and Friday.   traMADol 50 MG tablet Commonly known as: ULTRAM Take 25-100 mg by mouth every 6 (six) hours as needed for moderate pain.   zolpidem 5 MG tablet Commonly known as: AMBIEN Take 5 mg by mouth at bedtime as needed for sleep.          Outstanding Labs/Studies   Complete Zio patch   Duration of Discharge Encounter   Greater than 30 minutes including physician time.  Jarrett Soho, PA 10/31/2020, 12:28 PM

## 2020-10-31 NOTE — Plan of Care (Signed)

## 2020-10-31 NOTE — Progress Notes (Addendum)
Progress Note  Patient Name: Kim Ward Date of Encounter: 10/31/2020  Primary Cardiologist: Kirk Ruths, MD   Subjective   No further chest pain.  Denies any dyspnea.  Inpatient Medications    Scheduled Meds:  amiodarone  400 mg Oral BID   calcium-vitamin D  1 tablet Oral Q breakfast   enoxaparin (LOVENOX) injection  40 mg Subcutaneous Q24H   pindolol  10 mg Oral BID   rosuvastatin  5 mg Oral Q M,W,F   Continuous Infusions:  PRN Meds: acetaminophen, ALPRAZolam, alum & mag hydroxide-simeth, loperamide, magnesium hydroxide, ondansetron (ZOFRAN) IV, zolpidem   Vital Signs    Vitals:   10/30/20 1133 10/30/20 2056 10/31/20 0530 10/31/20 0755  BP: (!) 111/52 140/69 (!) 135/53   Pulse:  63 65   Resp: 19 19 18    Temp: 98.6 F (37 C) 98.3 F (36.8 C) 97.8 F (36.6 C) 97.7 F (36.5 C)  TempSrc: Oral Oral Oral Oral  SpO2: 98% 96% 98%   Weight:   67.3 kg   Height:        Intake/Output Summary (Last 24 hours) at 10/31/2020 1015 Last data filed at 10/31/2020 0900 Gross per 24 hour  Intake 600 ml  Output 1550 ml  Net -950 ml   Filed Weights   10/29/20 0533 10/30/20 0116 10/31/20 0530  Weight: 68.2 kg 67.6 kg 67.3 kg    Physical Exam   General: Elderly, NAD Neck: Negative for carotid bruits. No JVD Lungs:Clear to ausculation bilaterally. No wheezes, rales, or rhonchi. Breathing is unlabored. Cardiovascular: RRR  +4/0 systolic murmur Abdomen: Soft, non-tender, non-distended. No obvious abdominal masses. Extremities: No edema. Radial pulses 2+ bilaterally Neuro: Alert and oriented. No focal deficits. No facial asymmetry. MAE spontaneously. Psych: Responds to questions appropriately with normal affect.    Labs    Chemistry Recent Labs  Lab 10/26/20 1628 10/27/20 0318 10/28/20 0234 10/29/20 0300 10/30/20 0712  NA 138   < > 141 141 142  K 4.4   < > 3.4* 4.4 4.1  CL 105   < > 108 108 106  CO2 23   < > 22 24 27   GLUCOSE 95   < > 138* 97 97   BUN 13   < > 16 13 14   CREATININE 0.87   < > 0.96 0.74 0.95  CALCIUM 9.1   < > 9.0 9.0 8.9  PROT 6.2*  --   --   --   --   ALBUMIN 3.6  --   --   --   --   AST 18  --   --   --   --   ALT 15  --   --   --   --   ALKPHOS 68  --   --   --   --   BILITOT 0.5  --   --   --   --   GFRNONAA >60   < > 57* >60 58*  ANIONGAP 10   < > 11 9 9    < > = values in this interval not displayed.     Hematology Recent Labs  Lab 10/28/20 0234 10/29/20 0300 10/30/20 0712  WBC 12.5* 8.0 7.7  RBC 4.56 4.25 4.58  HGB 12.9 12.5 13.4  HCT 41.9 37.8 40.8  MCV 91.9 88.9 89.1  MCH 28.3 29.4 29.3  MCHC 30.8 33.1 32.8  RDW 13.8 13.9 13.7  PLT 236 227 246    Cardiac EnzymesNo results  for input(s): TROPONINI in the last 168 hours. No results for input(s): TROPIPOC in the last 168 hours.   BNPNo results for input(s): BNP, PROBNP in the last 168 hours.   DDimer No results for input(s): DDIMER in the last 168 hours.   Radiology    No results found.  Telemetry    10/30/20 NSR/ST- Personally Reviewed  ECG    10/30/20 NSR with no acute changes- Personally Reviewed  Cardiac Studies   Echocardiogram 08/25/20:  1. Left ventricular ejection fraction, by estimation, is 60 to 65%. The  left ventricle has normal function. The left ventricle has no regional  wall motion abnormalities. There is mild left ventricular hypertrophy.  Left ventricular diastolic parameters  are consistent with Grade II diastolic dysfunction (pseudonormalization).  2. Right ventricular systolic function is normal. The right ventricular  size is normal.  3. The mitral valve is normal in structure. Mild mitral valve  regurgitation. No evidence of mitral stenosis.  4. Gradient is less this time as compare to echocardiogram from  12/30/2019. Most likely missed higher velocity jet of AS at this study. All  other parameters indicate severe AS. The aortic valve is normal in  structure. Aortic valve regurgitation is mild.   Severe aortic valve stenosis. Aortic valve mean gradient measures 30.7  mmHg.  5. The inferior vena cava is normal in size with greater than 50%  respiratory variability, suggesting right atrial pressure of 3 mmHg.    Lexiscan 10/22/2012:  Normal stress test with no ischemia or infarct   Patient Profile     86 y.o. female with a PMH of PSVT, HTN, HLD, hypothyroid, severe AS, who presented with SVT. She was discharged 10/26/20 with AVNTR on BB. Resolved with Carotid Massage 10/28/20.  Amiodarone started 10/28/20 with decrease in intermittent SVT; but still present.  Assessment & Plan    1. SVT: -Pindolol increased to 10mg  and pt started on Amiodarone 400mg  PO BID with plans to load for SVT suppression>>>EP recommending amio load, hold off on intervention given comorbidities.  Plan amio 400 mg BID x 7 days, then 400 mg daily x 7 days, then 200 mg daily.  Plan Zio x 2 weeks on discharge  2. Severe AS: -refer to structural heart clinic as outpatient  3. Chest pain: -Pt had an episode of chest pain yesterday -EKG with no acute changes -Troponins unremarkable x2  Disposition: plan discharge today on amio load.  Will schedule outpatient f/u with Structural Heart for TAVR evaluation  For questions or updates, please contact   Please consult [email or web address] for contact info under Cardiology/STEMI.   Donato Heinz, MD

## 2020-10-31 NOTE — Plan of Care (Signed)
  Problem: Education: Goal: Knowledge of General Education information will improve Description: Including pain rating scale, medication(s)/side effects and non-pharmacologic comfort measures Outcome: Adequate for Discharge   Problem: Health Behavior/Discharge Planning: Goal: Ability to manage health-related needs will improve Outcome: Adequate for Discharge   Problem: Clinical Measurements: Goal: Ability to maintain clinical measurements within normal limits will improve Outcome: Adequate for Discharge Goal: Will remain free from infection Outcome: Adequate for Discharge Goal: Diagnostic test results will improve Outcome: Adequate for Discharge Goal: Respiratory complications will improve Outcome: Adequate for Discharge Goal: Cardiovascular complication will be avoided Outcome: Adequate for Discharge   Problem: Activity: Goal: Risk for activity intolerance will decrease Outcome: Adequate for Discharge   Problem: Nutrition: Goal: Adequate nutrition will be maintained Outcome: Adequate for Discharge   Problem: Coping: Goal: Level of anxiety will decrease Outcome: Adequate for Discharge   Problem: Elimination: Goal: Will not experience complications related to bowel motility Outcome: Adequate for Discharge Goal: Will not experience complications related to urinary retention Outcome: Adequate for Discharge   Problem: Pain Managment: Goal: General experience of comfort will improve Outcome: Adequate for Discharge   Problem: Safety: Goal: Ability to remain free from injury will improve Outcome: Adequate for Discharge   Problem: Skin Integrity: Goal: Risk for impaired skin integrity will decrease Outcome: Adequate for Discharge   Problem: Education: Goal: Ability to manage disease process will improve Outcome: Adequate for Discharge Goal: Individualized Educational Video(s) Outcome: Adequate for Discharge   Problem: Cardiac: Goal: Ability to achieve and maintain  adequate cardiopulmonary perfusion will improve Outcome: Adequate for Discharge

## 2020-10-31 NOTE — Progress Notes (Signed)
D/C instructions given and reviewed. No questions asked but encouraged to call with any concerns. Tele and IV removed, tolerated well. 

## 2020-10-31 NOTE — Progress Notes (Signed)
Zio patch placed onto patient.  All instructions and information reviewed with patient, they verbalize understanding with no questions. 

## 2020-11-08 ENCOUNTER — Telehealth: Payer: Self-pay | Admitting: Cardiology

## 2020-11-08 NOTE — Telephone Encounter (Signed)
    Renee with health help needs to speak with Dr. Jacalyn Lefevre nurse about pt's event monitor she gave Case# [id number]

## 2020-11-08 NOTE — H&P (Signed)
Kim Perla, MD  Physician  Cardiology  Consult Note     Signed  Date of Service:  10/26/2020 5:35 PM           Signed      Expand AllCollapse All      Show:Clear all [x] Manual[x] Template[x] Copied  Added by: [x] Barrett, Evelene Croon, PA-C[x] Kim Perla, MD   [] Hover for details   Cardiology Consultation:   Patient ID: Kim Ward; [id number]; [birth year]   Admit date: 10/26/2020 Date of Consult: 10/26/2020  Primary Care Provider: Linus Mako, NP Primary Cardiologist: Kim Ruths, MD 09/19/2020 Primary Electrophysiologist:  None   Patient Profile:   Kim Ward is a 86 y.o. female with a hx of PSVT, HTN, HLD, hypothyroid, severe AS, who is being seen today for the evaluation of SVT at the request of Kim Ward.  History of Present Illness:   Ms. Beam was in her USOH this am when she had sudden onset of heart racing. She did not get SOB with this. No chest pain. She was not light-headed or dizzy.   No unusual activity or stress. No OTC meds, drinks decaf coffee and tea at baseline.   Her neighbor came by and took her to the clinic >> ER by EMS.   EMS gave her adenosine 6 mg and 12 mg, but SVT recurred.   In the ER, she was given metoprolol 5 mg IV and the SVT has resolved.   She is currently resting comfortably, in SR.  This is the first time her heart has done this in years.   She has been doing well in general.       Past Medical History:  Diagnosis Date  . Adenomatous colon polyp   . Aortic stenosis   . Depression    Tx'd 19 yrs ago when husband passed away  . Diverticulosis   . Heart murmur   . Hyperlipidemia   . Hypertension   . Hypothyroidism    Kim Ward follows pt.--had small nodules but had decreased in size and stopped Synthroid 06/2013  . Osteopenia   . Osteoporosis   . Pure hypercholesterolemia   . SVT (supraventricular tachycardia) ([facility])          Past Surgical History:  Procedure  Laterality Date  . CERVICAL BIOPSY  W/ LOOP ELECTRODE EXCISION  1996   CIN I  . COLONOSCOPY  03/2001, 01/2007   2008 - no polyps  . DILATION AND CURETTAGE OF UTERUS    . TONSILLECTOMY              Prior to Admission medications   Medication Sig Start Date End Date Taking? Authorizing Provider  ALPRAZolam Duanne Moron) 0.5 MG tablet Take 0.5 mg by mouth as directed. 06/06/20   [provider]  amLODipine (NORVASC) 2.5 MG tablet Take 2.5 mg by mouth at bedtime. 06/21/20   [provider]  amoxicillin (AMOXIL) 500 MG capsule Take 500 mg by mouth as directed. 4 capsules before dental procedures    [provider]  BIOTIN PO Take 1 tablet by mouth daily.     [provider]  bisoprolol (ZEBETA) 5 MG tablet TAKE 1 TABLET ONCE DAILY. Patient taking differently: Take 5 mg by mouth daily.  03/29/19   Kim Perla, MD  calcium-vitamin D (CALCIUM 500+D) 500-200 MG-UNIT per tablet Take 1 tablet by mouth daily with breakfast.    [provider]  ibuprofen (ADVIL) 200 MG tablet Take 400 mg by mouth every 6 (  six) hours as needed. For pain    [provider]  loratadine (CLARITIN) 10 MG tablet Take 10 mg by mouth daily.    [provider]  losartan (COZAAR) 50 MG tablet Take 50 mg by mouth daily.    [provider]  rosuvastatin (CRESTOR) 5 MG tablet Take 5 mg by mouth 3 (three) times a week. 02/21/20   [provider]    Inpatient Medications: Scheduled Meds:  Continuous Infusions:  PRN Meds:   Allergies:         Allergies  Allergen Reactions  . Augmentin [Amoxicillin-Pot Clavulanate]   . Azithromycin     Or any drug that ends in "mycin"  . Bextra [Valdecoxib]     Unknown  . Cefuroxime Other (See Comments)    dizziness  . Cefuroxime Axetil     Severe dizziness  . Humibid La [Guaifenesin] Other (See Comments)    Mouth sores  . Lipitor [Atorvastatin Calcium]      MYALGIAS  . Ramipril Cough  . Risedronate Sodium      GI ISSUES  . Tramadol Other (See Comments)    Vomiting and diarrhea  . Zocor [Simvastatin]     MYALGIAS  . Niaspan [Niacin Er] Rash    Social History:   Social History        Socioeconomic History  . Marital status: Widowed    Spouse name: Not on file  . Number of children: Not on file  . Years of education: Not on file  . Highest education level: Not on file  Occupational History  . Not on file  Tobacco Use  . Smoking status: Former Smoker    Quit date: 06/17/1972    Years since quitting: 48.3  . Smokeless tobacco: Never Used  . Tobacco comment: quit June 17, 1972  Substance and Sexual Activity  . Alcohol use: Yes    Alcohol/week: 0.0 standard drinks    Comment: 2 drinks per month  . Drug use: No  . Sexual activity: Never    Partners: Male    Birth control/protection: Post-menopausal  Other Topics Concern  . Not on file  Social History Narrative  . Not on file   Social Determinants of Health   Financial Resource Strain: Not on file  Food Insecurity: Not on file  Transportation Needs: Not on file  Physical Activity: Not on file  Stress: Not on file  Social Connections: Not on file  Intimate Partner Violence: Not on file    Family History:        Family History  Problem Relation Age of Onset  . Stroke Sister   . Breast cancer Sister   . Breast cancer Daughter 39       metastatic breast ca   Family Status:       Family Status  Relation Name Status  . Sister 4 Deceased  . Mother  Deceased  . Father  Deceased       [employer]  . Brother 2 Alive  . Daughter  (Not Specified)  . MGM  Deceased  . MGF  Deceased  . PGM  Deceased  . PGF  Deceased    ROS:  Please see the history of present illness. No fevers, chills, productive cough or hemoptysis. All other ROS reviewed and negative.     Physical Exam/Data:        Vitals:   10/26/20  1641 10/26/20 1645 10/26/20 1715  BP: (!) 141/67 135/61 121/69  Pulse: 96 94 (!)  150  Resp: 15 14 18   Temp: 98 F (36.7 C)    TempSrc: Oral    SpO2: 99% 96% 93%   No intake or output data in the 24 hours ending 10/26/20 1815  Last 3 Weights 09/19/2020 07/05/2020 06/03/2020  Weight (lbs) 159 lb 6.4 oz 162 lb 6.4 oz 155 lb  Weight (kg) 72.303 kg 73.664 kg 70.308 kg     There is no height or weight on file to calculate BMI.   General:  Well nourished, well developed, female in no acute distress HEENT: normal Lymph: no adenopathy Neck: JVD - not elevated Endocrine:  No thryomegaly Vascular: No carotid bruits; 4/4 extremity pulses 2+  Cardiac:  normal S1, S2; RRR; 2/6 systolic murmur; S2 diminished Lungs:  clear bilaterally, no wheezing, rhonchi or rales  Abd: soft, nontender, no hepatomegaly  Ext: no edema Musculoskeletal:  No deformities, BUE and BLE strength normal and equal Skin: warm and dry  Neuro:  CNs 2-12 intact, no focal abnormalities noted Psych:  Normal affect   EKG:  The EKG was personally reviewed and demonstrates:  SVT, RBBB, HR 155 Telemetry:  Telemetry was personally reviewed and demonstrates:  SVT >> SR   CV studies:   ECHO: 08/25/2020 1. Left ventricular ejection fraction, by estimation, is 60 to 65%. The  left ventricle has normal function. The left ventricle has no regional  wall motion abnormalities. There is mild left ventricular hypertrophy.  Left ventricular diastolic parameters  are consistent with Grade II diastolic dysfunction (pseudonormalization).  2. Right ventricular systolic function is normal. The right ventricular  size is normal.  3. The mitral valve is normal in structure. Mild mitral valve  regurgitation. No evidence of mitral stenosis.  4. Gradient is less this time as compare to echocardiogram from  12/30/2019. Most likely missed higher velocity jet of AS at this study. All other parameters indicate severe AS. The aortic  valve is normal in  structure. Aortic valve regurgitation is mild.  Severe aortic valve stenosis. Aortic valve mean gradient measures 30.7 mmHg.  5. The inferior vena cava is normal in size with greater than 50%  respiratory variability, suggesting right atrial pressure of 3 mmHg.   Laboratory Data:    Recent Labs       Lab Results  Component Value Date   ALT 14 02/07/2020   AST 21 02/07/2020   ALKPHOS 54 02/07/2020   BILITOT 0.7 02/07/2020     Hematology Last Labs      Recent Labs  Lab 10/26/20 1628  WBC 8.5  RBC 4.85  HGB 13.7  HCT 44.0  MCV 90.7  MCH 28.2  MCHC 31.1  RDW 13.6  PLT 253     TSH:  Recent Labs       Lab Results  Component Value Date   TSH 2.45 06/24/2016     Lipids: Recent Labs       Lab Results  Component Value Date   CHOL 153 06/24/2016   HDL 55 06/24/2016   LDLCALC 67 06/24/2016   LDLDIRECT 140.2 06/24/2014   TRIG 156 (H) 06/24/2016   CHOLHDL 2.8 06/24/2016      Assessment and Plan:   1. SVT: - Spontaneously occurred w/out any obvious triggers.    Principal Problem:   SVT (supraventricular tachycardia) ([facility])     For questions or updates, please contact [place] Please consult [email or web address] for contact info under Cardiology/STEMI.   Signed, Kim Ruths, MD  10/26/2020 6:15 PM As above, patient  seen and examined.  Briefly she is an 86 year old female with past medical history of supraventricular tachycardia, hypothyroidism with supraventricular tachycardia. Last echocardiogram October 2021 showed normal LV function, mild left ventricular hypertrophy, grade 2 diastolic dysfunction, mild mitral regurgitation, severe aortic stenosis and mild aortic insufficiency.  Patient's aortic stenosis is being followed as she is asymptomatic.  She typically does not have dyspnea on exertion, orthopnea, PND, pedal edema or syncope.  This morning she developed sudden onset of palpitations similar to her  previous SVT though she has not had bouts of this in several years.  Her symptoms would not resolve and EMS was called.  She was treated with adenosine 6 mg IV which converted her to sinus rhythm.  She then had another episode with heart rate up to 180 bpm and received 12 mg of adenosine which again converted the patient to sinus rhythm.  Note with her SVT she had only palpitations.  There was no chest pain, dyspnea or syncope.  She now feels well.  Cardiology asked to evaluate.  Note electrocardiogram September 19, 2020 at time of last office visit showed sinus bradycardia at a heart rate of 51 and first-degree AV block. Hemoglobin 13.7.  Initial electrocardiogram appears to show supraventricular tachycardia with aberrant conduction at times.  Follow-up ECG shows sinus rhythm with first-degree AV block.  Review of patient's rhythm strips shows probable SVT at a rate of 155.  No flutter waves are noted when patient was given adenosine and it did cause conversion to sinus rhythm.  1 supraventricular tachycardia-patient presented with recurrent supraventricular tachycardia.  She received adenosine 6 mg IV and converted to sinus rhythm but then had another episode requiring 12 mg IV.  She was then given 5 mg of IV metoprolol and has had no recurrences.  Given age I would like to avoid ablation if possible.  Note this has been her first recurrence in several years.  Also note she has a baseline first-degree AV block and we need to be careful advancing beta-blockade.  I will discontinue bisoprolol and instead treat with pindolol 5 mg twice daily.  Given age and baseline severe aortic stenosis as well as recurrence of atrial arrhythmias I would like to observe on telemetry overnight.  Follow for worsening bradycardia on higher doses of beta-blockade.  Note LV function is normal.  If she has recurrences despite higher doses of beta-blockade would need to consider referral to electrophysiology for ablation.  2 severe  aortic stenosis-patient has not had symptoms including no dyspnea, chest pain or syncope.  This will be followed.  She understands she may require TAVR in the future.  3 hypertension-given that we are increasing beta-blockade for SVT we will discontinue amlodipine and follow blood pressure.  4 hyperlipidemia-continue statin.  Kim Ruths, MD

## 2020-11-08 NOTE — Telephone Encounter (Signed)
Spoke with health help, questions regarding diagnosis for monitor answered.

## 2020-11-15 ENCOUNTER — Encounter (INDEPENDENT_AMBULATORY_CARE_PROVIDER_SITE_OTHER): Payer: Medicare PPO | Admitting: Ophthalmology

## 2020-11-15 ENCOUNTER — Ambulatory Visit: Payer: Medicare PPO | Admitting: Cardiology

## 2020-11-15 ENCOUNTER — Telehealth: Payer: Self-pay | Admitting: Cardiology

## 2020-11-15 NOTE — Telephone Encounter (Signed)
[place] with [employer] called to provide an update regarding authorization request for a heart monitor. She states the request was denied and a denial letter will be mailed to the office. Denial authorization number: [id number]

## 2020-11-15 NOTE — Telephone Encounter (Signed)
Will route to Primary Nurse as she has been working on this.   FYI.  Thanks!

## 2020-11-16 ENCOUNTER — Ambulatory Visit (INDEPENDENT_AMBULATORY_CARE_PROVIDER_SITE_OTHER): Payer: Medicare PPO | Admitting: Ophthalmology

## 2020-11-16 ENCOUNTER — Encounter (INDEPENDENT_AMBULATORY_CARE_PROVIDER_SITE_OTHER): Payer: Self-pay | Admitting: Ophthalmology

## 2020-11-16 ENCOUNTER — Other Ambulatory Visit: Payer: Self-pay

## 2020-11-16 DIAGNOSIS — H33102 Unspecified retinoschisis, left eye: Secondary | ICD-10-CM | POA: Diagnosis not present

## 2020-11-16 DIAGNOSIS — H33322 Round hole, left eye: Secondary | ICD-10-CM | POA: Diagnosis not present

## 2020-11-16 DIAGNOSIS — H43813 Vitreous degeneration, bilateral: Secondary | ICD-10-CM | POA: Diagnosis not present

## 2020-11-16 DIAGNOSIS — H353131 Nonexudative age-related macular degeneration, bilateral, early dry stage: Secondary | ICD-10-CM

## 2020-11-16 NOTE — Assessment & Plan Note (Signed)
OS with no interval change in the macular anatomy with no impact on acuity, will continue to observe this condition

## 2020-11-16 NOTE — Assessment & Plan Note (Signed)

## 2020-11-16 NOTE — Assessment & Plan Note (Signed)

## 2020-11-16 NOTE — Progress Notes (Signed)
11/16/2020     CHIEF COMPLAINT Patient presents for Retina Follow Up (3 YR FU OU///Pt reports double vision (vertically) OU, difficulty watching TV OU, doesn't read anymore due to double vision OU, no pain or pressure. Pt is still driving, but has had trouble lately. )   HISTORY OF PRESENT ILLNESS: Kim Ward is a 86 y.o. female who presents to the clinic today for:   HPI    Retina Follow Up    Patient presents with  Dry AMD.  In both eyes.  This started 3 years ago.  Duration of 3 years.  Since onset it is stable. Additional comments: 3 YR FU OU   Pt reports double vision (vertically) OU, difficulty watching TV OU, doesn't read anymore due to double vision OU, no pain or pressure. Pt is still driving, but has had trouble lately.        Last edited by Nichola Sizer D on 11/16/2020 10:15 AM. (History)      Referring physician: Linus Mako, NP [place],  [place] [address]  HISTORICAL INFORMATION:   Selected notes from the [facility]: No current outpatient medications on file. (Ophthalmic Drugs)   No current facility-administered medications for this visit. (Ophthalmic Drugs)   Current Outpatient Medications (Other)  Medication Sig  . ALPRAZolam (XANAX) 0.5 MG tablet Take 0.5 mg by mouth 2 (two) times daily as needed for anxiety or sleep.  [name] amiodarone (PACERONE) 200 MG tablet Take 2 tablets (400mg ) twice daily for 7 days then 2 table (400mg ) daily for 7 days than 1 table (200mg ) daily  . [START ON 12/01/2020] amiodarone (PACERONE) 200 MG tablet Take 1 tablet (200 mg total) by mouth daily.  [name] amLODipine (NORVASC) 2.5 MG tablet Take 1 tablet (2.5 mg total) by mouth daily.  [name] amoxicillin (AMOXIL) 500 MG capsule Take 500 mg by mouth as directed. 4 capsules before dental procedures  . BIOTIN PO Take 1 tablet by mouth daily.   . calcium-vitamin D (OSCAL WITH D) 500-200 MG-UNIT tablet Take 1 tablet by mouth daily with  breakfast.  . ibuprofen (ADVIL) 200 MG tablet Take 400 mg by mouth every 6 (six) hours as needed. For pain  . loratadine (CLARITIN) 10 MG tablet Take 10 mg by mouth daily.  . pindolol (VISKEN) 10 MG tablet Take 1 tablet (10 mg total) by mouth 2 (two) times daily.  . rosuvastatin (CRESTOR) 5 MG tablet Take 5 mg by mouth every Monday, Wednesday, and Friday.  . traMADol (ULTRAM) 50 MG tablet Take 25-100 mg by mouth every 6 (six) hours as needed for moderate pain.  [name] zolpidem (AMBIEN) 5 MG tablet Take 5 mg by mouth at bedtime as needed for sleep.   No current facility-administered medications for this visit. (Other)      REVIEW OF SYSTEMS:    ALLERGIES Allergies  Allergen Reactions  . Augmentin [Amoxicillin-Pot Clavulanate]   . Azithromycin     Or any drug that ends in "mycin"  . Bextra [Valdecoxib]     Unknown  . Cefuroxime Other (See Comments)    dizziness  . Cefuroxime Axetil     Severe dizziness  . Etodolac Other (See Comments)    Reaction unknown  . Humibid La [Guaifenesin] Other (See Comments)    Mouth sores  . Lipitor [Atorvastatin Calcium]     MYALGIAS  . Ramipril Cough  . Risedronate Sodium      GI  ISSUES  . Tramadol Other (See Comments)    Vomiting and diarrhea  . Zocor [Simvastatin]     MYALGIAS  . Niaspan [Niacin Er] Rash    PAST MEDICAL HISTORY Past Medical History:  Diagnosis Date  . Adenomatous colon polyp   . Anginal pain ([place])   . Aortic stenosis   . Depression    Tx'd 19 yrs ago when husband passed away  . Diverticulosis   . Dysrhythmia   . Heart murmur   . Hyperlipidemia   . Hypertension   . Hypothyroidism    Dr. Dwyane Dee follows pt.--had small nodules but had decreased in size and stopped Synthroid 06/2013  . Osteopenia   . Osteoporosis   . Pure hypercholesterolemia   . SVT (supraventricular tachycardia) ([facility])    Past Surgical History:  Procedure Laterality Date  . CERVICAL BIOPSY  W/ LOOP ELECTRODE EXCISION  1996   CIN I  . COLONOSCOPY   03/2001, 01/2007   2008 - no polyps  . DILATION AND CURETTAGE OF UTERUS    . TONSILLECTOMY      FAMILY HISTORY Family History  Problem Relation Age of Onset  . Stroke Sister   . Breast cancer Sister   . Breast cancer Daughter 5       metastatic breast ca    SOCIAL HISTORY Social History   Tobacco Use  . Smoking status: Former Smoker    Quit date: 06/17/1972    Years since quitting: 48.4  . Smokeless tobacco: Never Used  . Tobacco comment: quit June 17, 1972  Vaping Use  . Vaping Use: Never used  Substance Use Topics  . Alcohol use: Yes    Alcohol/week: 0.0 standard drinks    Comment: 2 drinks per month  . Drug use: No         OPHTHALMIC EXAM:  Base Eye Exam    Visual Acuity (ETDRS)      Right Left   Dist cc 20/50 -2 20/40 +2   Dist ph cc 20/25 -2 NI   Correction: Glasses       Tonometry (Tonopen, 10:22 AM)      Right Left   Pressure 18 15       Pupils      Pupils Dark Light Shape React APD   Right PERRL 3 2 Round Brisk None   Left PERRL 3 2 Round Brisk None       Visual Fields (Counting fingers)      Left Right    Full Full       Extraocular Movement      Right Left    Full Full       Neuro/Psych    Oriented x3: Yes       Dilation    Both eyes: 1.0% Mydriacyl, 2.5% Phenylephrine @ 10:22 AM        Slit Lamp and Fundus Exam    External Exam      Right Left   External Normal Normal       Slit Lamp Exam      Right Left   Lids/Lashes Normal Normal   Conjunctiva/Sclera White and quiet White and quiet   Cornea Clear Clear   Anterior Chamber Deep and quiet Deep and quiet   Iris Round and reactive Round and reactive   Lens Centered posterior chamber intraocular lens, PC clear Centered posterior chamber intraocular lens, PC clear   Anterior Vitreous Normal Normal       Fundus Exam  Right Left   Posterior Vitreous Posterior vitreous detachment Posterior vitreous detachment   Disc Normal Normal   C/D Ratio 0.2 0.1   Macula  Hard drusen, no macular thickening, no hemorrhage, no exudates Hard drusen, no macular thickening, no hemorrhage, no exudates   Vessels Normal Normal   Periphery Normal, no holes or tears Old hole @ 2, good pexy          IMAGING AND PROCEDURES  Imaging and Procedures for 11/16/20  OCT, Retina - OU - Both Eyes       Right Eye Quality was good. Scan locations included subfoveal. Central Foveal Thickness: 263. Progression has been stable. Findings include normal foveal contour.   Left Eye Quality was good. Scan locations included subfoveal. Central Foveal Thickness: 321. Progression has been stable. Findings include abnormal foveal contour.   Notes OS with foveal macular schisis of the inner retina which has been stable for years, no interval change with good acuity will observe                ASSESSMENT/PLAN:  Macular retinoschisis of left eye OS with no interval change in the macular anatomy with no impact on acuity, will continue to observe this condition  Posterior vitreous detachment of both eyes   The nature of posterior vitreous detachment was discussed with the patient as well as its physiology, its age prevalence, and its possible implication regarding retinal breaks and detachment.  An informational brochure was given to the patient.  All the patient's questions were answered.  The patient was asked to return if new or different flashes or floaters develops.   Patient was instructed to contact office immediately if any changes were noticed. I explained to the patient that vitreous inside the eye is similar to jello inside a bowl. As the jello melts it can start to pull away from the bowl, similarly the vitreous throughout our lives can begin to pull away from the retina. That process is called a posterior vitreous detachment. In some cases, the vitreous can tug hard enough on the retina to form a retinal tear. I discussed with the patient the signs and symptoms of a  retinal detachment.  Do not rub the eye.  Early stage nonexudative age-related macular degeneration of both eyes The nature of age--related macular degeneration was discussed with the patient as well as the distinction between dry and wet types. Checking an [place]msler Grid daily with advice to return immediately should a distortion develop, was given to the patient. The patient 's smoking status now and in the past was determined and advice based on the AREDS study was provided regarding the consumption of antioxidant supplements. AREDS 2 vitamin formulation was recommended. Consumption of dark leafy vegetables and fresh fruits of various colors was recommended. Treatment modalities for wet macular degeneration particularly the use of intravitreal injections of anti-blood vessel growth factors was discussed with the patient. Avastin, Lucentis, and Eylea are the available options. On occasion, therapy includes the use of photodynamic therapy and thermal laser. Stressed to the patient do not rub eyes.  Patient was advised to check Amsler Grid daily and return immediately if changes are noted. Instructions on using the grid were given to the patient. All patient questions were answered.  Retinal hole of left eye Old, resolved in the past      ICD-10-CM   1. Macular retinoschisis of left eye  H33.102 OCT, Retina - OU - Both Eyes  2. Posterior vitreous detachment of both eyes  H43.813 OCT, Retina - OU - Both Eyes  3. Early stage nonexudative age-related macular degeneration of both eyes  H35.3131   4. Retinal hole of left eye  H33.322     1.  Patient needs refractive measurements, have asked patient to return to see Dr. Rutherford Guys for assistance  2.  Structural anatomic retinal difficulties explain her ongoing symptoms  3.  Ophthalmic Meds Ordered this visit:  No orders of the defined types were placed in this encounter.      Return in about 1 year (around 11/16/2021) for DILATE OU, OCT,  recommend follow-up with Dr. Gershon Crane.  There are no Patient Instructions on file for this visit.   Explained the diagnoses, plan, and follow up with the patient and they expressed understanding.  Patient expressed understanding of the importance of proper follow up care.   Clent Demark Joane Postel M.D. Diseases & Surgery of the Retina and Vitreous Retina [facility] Diabetic [place] 11/16/20     Abbreviations: M myopia (nearsighted); A astigmatism; H hyperopia (farsighted); P presbyopia; Mrx spectacle prescription;  CTL contact lenses; OD right eye; OS left eye; OU both eyes  XT exotropia; ET esotropia; PEK punctate epithelial keratitis; PEE punctate epithelial erosions; DES dry eye syndrome; MGD meibomian gland dysfunction; ATs artificial tears; PFAT's preservative free artificial tears; [place] nuclear sclerotic cataract; PSC posterior subcapsular cataract; ERM epi-retinal membrane; PVD posterior vitreous detachment; RD retinal detachment; DM diabetes mellitus; DR diabetic retinopathy; NPDR non-proliferative diabetic retinopathy; PDR proliferative diabetic retinopathy; CSME clinically significant macular edema; DME diabetic macular edema; dbh dot blot hemorrhages; CWS cotton wool spot; POAG primary open angle glaucoma; C/D cup-to-disc ratio; HVF humphrey visual field; GVF goldmann visual field; OCT optical coherence tomography; IOP intraocular pressure; BRVO Branch retinal vein occlusion; CRVO central retinal vein occlusion; CRAO central retinal artery occlusion; BRAO branch retinal artery occlusion; RT retinal tear; SB scleral buckle; PPV pars plana vitrectomy; VH Vitreous hemorrhage; PRP panretinal laser photocoagulation; IVK intravitreal kenalog; VMT vitreomacular traction; MH Macular hole;  NVD neovascularization of the disc; NVE neovascularization elsewhere; AREDS age related eye disease study; ARMD age related macular degeneration; POAG primary open angle glaucoma; EBMD epithelial/anterior basement membrane  dystrophy; ACIOL anterior chamber intraocular lens; IOL intraocular lens; PCIOL posterior chamber intraocular lens; Phaco/IOL phacoemulsification with intraocular lens placement; [place] photorefractive keratectomy; LASIK laser assisted in situ keratomileusis; HTN hypertension; DM diabetes mellitus; COPD chronic obstructive pulmonary disease

## 2020-11-16 NOTE — Assessment & Plan Note (Signed)
Old, resolved in the past

## 2020-11-20 ENCOUNTER — Telehealth: Payer: Self-pay | Admitting: Cardiovascular Disease

## 2020-11-20 ENCOUNTER — Institutional Professional Consult (permissible substitution): Payer: Medicare PPO | Admitting: Cardiovascular Disease

## 2020-11-20 NOTE — Telephone Encounter (Signed)
Patient needs to reschedule consultation with Dr. Burt Knack. Patient's Daughter can not bring her due to the weather

## 2020-11-20 NOTE — Telephone Encounter (Signed)
Rescheduled patient to 1/24 per request due to inclement weather. She was grateful for call and agrees with plan.

## 2020-11-24 NOTE — Progress Notes (Signed)
HPI: FU paroxysmal supraventricular tachycardia, AS,hypercholesterolemia, and benign hypertensive heart disease.Nuclear study December 2013 showed ejection fraction 73% and normal perfusion. EchocardiogramFebruary 2021 showed normal LV systolic function, mild left ventricular hypertrophy, grade 2 diastolic dysfunction, severe aortic stenosis with mean gradient 45 mmHg, trace aortic insufficiency and mild left atrial enlargement. Echocardiogram October 2021 showed normal LV function, mild left ventricular hypertrophy, grade 2 diastolic dysfunction, mild mitral regurgitation, moderate to severe aortic stenosis with mean gradient 31 mmHg and mild aortic insufficiency.  Monitor December 2021 showed sinus rhythm with PAC and PVC.  Patient admitted in December 2021 with recurrent SVT.  Her beta-blocker was increased and she was discharged.  However she returned with recurrent SVT and was placed on amiodarone.  Patient seen by Dr. Quentin Ore and conservative measures felt indicated.  She was also referred to the structural heart team for consideration of TAVR.  Troponin was minimally elevated but felt not consistent with acute coronary syndrome.  Since last seen,  she notes some dyspnea on exertion which is new over the past several months.  No orthopnea, PND, pedal edema, chest pain or syncope.  She had one 45-minute episode of SVT that resolved spontaneously.  Current Outpatient Medications  Medication Sig Dispense Refill   ALPRAZolam (XANAX) 0.5 MG tablet Take 0.5 mg by mouth 2 (two) times daily as needed for anxiety or sleep.     amiodarone (PACERONE) 200 MG tablet Take 2 tablets (400mg ) twice daily for 7 days then 2 table (400mg ) daily for 7 days than 1 table (200mg ) daily 56 tablet 0   amiodarone (PACERONE) 200 MG tablet Take 1 tablet (200 mg total) by mouth daily. 30 tablet 3   amLODipine (NORVASC) 2.5 MG tablet Take 1 tablet (2.5 mg total) by mouth daily. 30 tablet 11   amoxicillin (AMOXIL)  500 MG capsule Take 500 mg by mouth as directed. 4 capsules before dental procedures     BIOTIN PO Take 1 tablet by mouth daily.      calcium-vitamin D (OSCAL WITH D) 500-200 MG-UNIT tablet Take 1 tablet by mouth daily with breakfast.     ibuprofen (ADVIL) 200 MG tablet Take 400 mg by mouth every 6 (six) hours as needed. For pain     loratadine (CLARITIN) 10 MG tablet Take 10 mg by mouth daily.     pindolol (VISKEN) 10 MG tablet Take 1 tablet (10 mg total) by mouth 2 (two) times daily. 60 tablet 6   rosuvastatin (CRESTOR) 5 MG tablet Take 5 mg by mouth every Monday, Wednesday, and Friday.     traMADol (ULTRAM) 50 MG tablet Take 25-100 mg by mouth every 6 (six) hours as needed for moderate pain.     zolpidem (AMBIEN) 5 MG tablet Take 5 mg by mouth at bedtime as needed for sleep.     No current facility-administered medications for this visit.     Past Medical History:  Diagnosis Date   Adenomatous colon polyp    Anginal pain ([place])    Aortic stenosis    Depression    Tx'd 19 yrs ago when husband passed away   Diverticulosis    Dysrhythmia    Heart murmur    Hyperlipidemia    Hypertension    Hypothyroidism    Dr. Dwyane Dee follows pt.--had small nodules but had decreased in size and stopped Synthroid 06/2013   Osteopenia    Osteoporosis    Pure hypercholesterolemia    SVT (supraventricular tachycardia) ([place])  Past Surgical History:  Procedure Laterality Date   CERVICAL BIOPSY  W/ LOOP ELECTRODE EXCISION  1996   CIN I   COLONOSCOPY  03/2001, 01/2007   2008 - no polyps   DILATION AND CURETTAGE OF UTERUS     TONSILLECTOMY      Social History   Socioeconomic History   Marital status: Widowed    Spouse name: Not on file   Number of children: 2   Years of education: Not on file   Highest education level: Not on file  Occupational History   Occupation: Retired  Tobacco Use   Smoking status: Former Smoker    Quit date: 06/17/1972    Years  since quitting: 48.5   Smokeless tobacco: Never Used   Tobacco comment: quit June 17, 1972  Vaping Use   Vaping Use: Never used  Substance and Sexual Activity   Alcohol use: Yes    Alcohol/week: 0.0 standard drinks    Comment: 2 drinks per month   Drug use: No   Sexual activity: Never    Partners: Male    Birth control/protection: Post-menopausal  Other Topics Concern   Not on file  Social History Narrative   Not on file   Social Determinants of Health   Financial Resource Strain: Not on file  Food Insecurity: Not on file  Transportation Needs: Not on file  Physical Activity: Not on file  Stress: Not on file  Social Connections: Not on file  Intimate Partner Violence: Not on file    Family History  Problem Relation Age of Onset   Stroke Sister    Breast cancer Sister    Breast cancer Daughter 87       metastatic breast ca    ROS: no fevers or chills, productive cough, hemoptysis, dysphasia, odynophagia, melena, hematochezia, dysuria, hematuria, rash, seizure activity, orthopnea, PND, pedal edema, claudication. Remaining systems are negative.  Physical Exam: Well-developed well-nourished in no acute distress.  Skin is warm and dry.  HEENT is normal.  Neck is supple.  Chest is clear to auscultation with normal expansion.  Cardiovascular exam is regular rate and rhythm.  3/6 systolic murmur left sternal border. Abdominal exam nontender or distended. No masses palpated. Extremities show no edema. neuro grossly intact  ECG-sinus rhythm with PAC, first-degree AV block, left ventricular hypertrophy.  Personally reviewed  A/P  1 supraventricular tachycardia-patient has had one 45-minute episode since being discharged from the hospital.  Continue amiodarone and pindolol; in 3 months we will check TSH, liver functions and chest x-ray.  If she has more frequent episodes in the future may need to consider ablation.  She is scheduled to follow-up with Dr. Quentin Ore  as well.  2 severe aortic stenosis-patient previously noted to have some dyspnea on exertion and evaluation for TAVR was suggested.  At that time she was hesitant.  She is now agreeable and evaluation has been arranged with Dr. Burt Knack later this month.  I personally reviewed the patient's recent echocardiogram.  The aortic valve is calcified with reduced cusp excursion.  Mean gradient is 31 mmHg but aortic valve area was calculated 0.7 cm and dimensionless index 0.23.  Also note previous echocardiogram had shown mean gradient of 45 mmHg.  3 hypertension-patient's blood pressure is controlled.  Continue present medications and follow.  4 hyperlipidemia-continue statin.  Kirk Ruths, MD

## 2020-12-05 ENCOUNTER — Ambulatory Visit: Payer: Medicare PPO | Admitting: Cardiology

## 2020-12-06 ENCOUNTER — Ambulatory Visit: Payer: Medicare PPO | Admitting: Cardiology

## 2020-12-06 ENCOUNTER — Encounter: Payer: Self-pay | Admitting: Cardiology

## 2020-12-06 ENCOUNTER — Other Ambulatory Visit: Payer: Self-pay

## 2020-12-06 VITALS — BP 126/60 | HR 52 | Ht 61.0 in | Wt 148.8 lb

## 2020-12-06 DIAGNOSIS — E785 Hyperlipidemia, unspecified: Secondary | ICD-10-CM

## 2020-12-06 DIAGNOSIS — I471 Supraventricular tachycardia: Secondary | ICD-10-CM | POA: Diagnosis not present

## 2020-12-06 DIAGNOSIS — I10 Essential (primary) hypertension: Secondary | ICD-10-CM

## 2020-12-06 DIAGNOSIS — I350 Nonrheumatic aortic (valve) stenosis: Secondary | ICD-10-CM

## 2020-12-06 NOTE — Patient Instructions (Signed)
Medication Instructions:  Your physician recommends that you continue on your current medications as directed. Please refer to the Current Medication list given to you today.  *If you need a refill on your cardiac medications before your next appointment, please call your pharmacy*  Lab Work: NONE ordered at this time of appointment   If you have labs (blood work) drawn today and your tests are completely normal, you will receive your results only by: [name] MyChart Message (if you have MyChart) OR . A paper copy in the mail If you have any lab test that is abnormal or we need to change your treatment, we will call you to review the results.  Testing/Procedures: NONE ordered at this time of appointment   Follow-Up: At [facility], you and your health needs are our priority.  As part of our continuing mission to provide you with exceptional heart care, we have created designated Provider Care Teams.  These Care Teams include your primary Cardiologist (physician) and Advanced Practice Providers (APPs -  Physician Assistants and Nurse Practitioners) who all work together to provide you with the care you need, when you need it.  We recommend signing up for the patient portal called "MyChart".  Sign up information is provided on this After Visit Summary.  MyChart is used to connect with patients for Virtual Visits (Telemedicine).  Patients are able to view lab/test results, encounter notes, upcoming appointments, etc.  Non-urgent messages can be sent to your provider as well.   To learn more about what you can do with MyChart, go to [email or web address].    Your next appointment:   3 month(s)  The format for your next appointment:   In Person  Provider:   Kirk Ruths, MD  Other Instructions

## 2020-12-10 NOTE — Progress Notes (Signed)
[facility]   MULTIDISCIPLINARY HEART VALVE TEAM  Date:  12/11/2020   ID:  VIENO TARRANT, DOB [birth year], MRN [id number]  PCP:  Linus Mako, NP   Chief Complaint  Patient presents with  . Aortic Stenosis    HISTORY OF PRESENT ILLNESS: Tysheena Ginzburg Mannings is a 87 y.o. female who presents for evaluation of severe aortic stenosis, referred by Dr Stanford Breed.  The patient has a history of aortic stenosis.  An echocardiogram in February 2021 demonstrated a mean transvalvular gradient of 45 mmHg, grade 2 diastolic dysfunction, mild LVH, and normal LV systolic function.  Her most recent echocardiogram from October 2021 showed similar findings except the mean transaortic valve gradient was 31 mmHg, felt to be underestimated.  The patient was recently seen by Dr. Stanford Breed on December 06, 2020.  She had recurrent SVT twice in December and was placed on amiodarone.  She underwent formal EP evaluation and medical therapy was recommended.  She admitted to worsening exertional dyspnea at the time of her recent visit.  She is now referred for evaluation of treatment options for her severe symptomatic aortic stenosis.  The patient is here alone today. She isn't aware of a longstanding heart murmur, but knows that her heart valve problem has been an issue for the past few years. The patients has been widowed since 1996. She had 3 children, one who passed away from cancer, a son who lives in [place], [place], and another daughter who lives in [place].   The patient has chest discomfort but this doesn't seem to be related to physical activity. She has lightheadedness, but no syncope. She is short of breath if she 'overdoes it.' No orthopnea, PND, or leg swelling.  The patient lives at [facility] in a 2 bedroom apartment.  She is functionally independent, still drives an automobile, and ambulates without significant problems.  The patient has had regular dental care and reports no problems. She has lower  implants and no problems with her upper teeth.   Past Medical History:  Diagnosis Date  . Adenomatous colon polyp   . Anginal pain ([place])   . Aortic stenosis   . Depression    Tx'd 19 yrs ago when husband passed away  . Diverticulosis   . Dysrhythmia   . Heart murmur   . Hyperlipidemia   . Hypertension   . Hypothyroidism    Dr. Dwyane Dee follows pt.--had small nodules but had decreased in size and stopped Synthroid 06/2013  . Osteopenia   . Osteoporosis   . Pure hypercholesterolemia   . SVT (supraventricular tachycardia) (HCC)     Current Outpatient Medications  Medication Sig Dispense Refill  . ALPRAZolam (XANAX) 0.5 MG tablet Take 0.5 mg by mouth 2 (two) times daily as needed for anxiety or sleep.    [name] amiodarone (PACERONE) 200 MG tablet Take 1 tablet (200 mg total) by mouth daily. 30 tablet 3  . amLODipine (NORVASC) 2.5 MG tablet Take 1 tablet (2.5 mg total) by mouth daily. 30 tablet 11  . amoxicillin (AMOXIL) 500 MG capsule Take 500 mg by mouth as directed. 4 capsules before dental procedures    . BIOTIN PO Take 1 tablet by mouth daily.     . calcium-vitamin D (OSCAL WITH D) 500-200 MG-UNIT tablet Take 1 tablet by mouth daily with breakfast.    . ibuprofen (ADVIL) 200 MG tablet Take 400 mg by mouth every 6 (six) hours as needed. For pain    . loratadine (CLARITIN)  10 MG tablet Take 10 mg by mouth daily.    . pindolol (VISKEN) 10 MG tablet Take 1 tablet (10 mg total) by mouth 2 (two) times daily. 60 tablet 6  . rosuvastatin (CRESTOR) 5 MG tablet Take 5 mg by mouth every Monday, Wednesday, and Friday.    . zolpidem (AMBIEN) 5 MG tablet Take 5 mg by mouth at bedtime as needed for sleep.     No current facility-administered medications for this visit.    ALLERGIES:   Augmentin [amoxicillin-pot clavulanate], Azithromycin, Bextra [valdecoxib], Cefuroxime, Cefuroxime axetil, Etodolac, Humibid la [guaifenesin], Lipitor [atorvastatin calcium], Ramipril, Risedronate sodium, Tramadol, Zocor  [simvastatin], and Niaspan [niacin er]   SOCIAL HISTORY:  The patient  reports that she quit smoking about 48 years ago. She has never used smokeless tobacco. She reports current alcohol use. She reports that she does not use drugs.   FAMILY HISTORY:  The patient's family history includes Breast cancer in her sister; Breast cancer (age of onset: 75) in her daughter; Stroke in her sister.   REVIEW OF SYSTEMS:  Positive for balance problems, knee and hip 'soreness.'   All other systems are reviewed and negative.   PHYSICAL EXAM: VS:  BP 130/60   Pulse (!) 56   Ht 5\' 5"  (1.651 m)   Wt 148 lb 12.8 oz (67.5 kg)   LMP 11/19/1983   SpO2 95%   BMI 24.76 kg/m  , BMI Body mass index is 24.76 kg/m. GEN: Well nourished, well developed, in no acute distress HEENT: normal Neck: No JVD. carotids 2+ without bruits or masses Cardiac: The heart is RRR with grade 3/6 harsh late peaking systolic murmur at the right upper sternal border.  No edema. Pedal pulses 2+ = bilaterally  Respiratory:  clear to auscultation bilaterally GI: soft, nontender, nondistended, + BS MS: no deformity or atrophy Skin: warm and dry, no rash Neuro:  Strength and sensation are intact Psych: euthymic mood, full affect  EKG:  EKG from reviewed and demonstrates sinus bradycardia 56 bpm, first degree AV block, otherwise normal  RECENT LABS: 10/26/2020: ALT 15 10/28/2020: Magnesium 1.9; TSH 1.259 10/30/2020: BUN 14; Creatinine, Ser 0.95; Hemoglobin 13.4; Platelets 246; Potassium 4.1; Sodium 142  No results found for requested labs within last 8760 hours.   CrCl cannot be calculated (Patient's most recent lab result is older than the maximum 21 days allowed.).   Wt Readings from Last 3 Encounters:  12/11/20 148 lb 12.8 oz (67.5 kg)  12/06/20 148 lb 12.8 oz (67.5 kg)  10/31/20 148 lb 4.8 oz (67.3 kg)     CARDIAC STUDIES:  Echo:  IMPRESSIONS    1. Left ventricular ejection fraction, by estimation, is 60 to 65%. The   left ventricle has normal function. The left ventricle has no regional  wall motion abnormalities. There is mild left ventricular hypertrophy.  Left ventricular diastolic parameters  are consistent with Grade II diastolic dysfunction (pseudonormalization).  2. Right ventricular systolic function is normal. The right ventricular  size is normal.  3. The mitral valve is normal in structure. Mild mitral valve  regurgitation. No evidence of mitral stenosis.  4. Gradient is less this time as compare to echocardiogram from  12/30/2019. Most likely missed higher velocity jet of AS at this study. All  other parameters indicate severe AS. The aortic valve is normal in  structure. Aortic valve regurgitation is mild.  Severe aortic valve stenosis. Aortic valve mean gradient measures 30.7  mmHg.  5. The inferior vena cava  is normal in size with greater than 50%  respiratory variability, suggesting right atrial pressure of 3 mmHg.   FINDINGS  Left Ventricle: Left ventricular ejection fraction, by estimation, is 60  to 65%. The left ventricle has normal function. The left ventricle has no  regional wall motion abnormalities. The left ventricular internal cavity  size was normal in size. There is  mild left ventricular hypertrophy. Left ventricular diastolic parameters  are consistent with Grade II diastolic dysfunction (pseudonormalization).   Right Ventricle: The right ventricular size is normal. No increase in  right ventricular wall thickness. Right ventricular systolic function is  normal.   Left Atrium: Left atrial size was normal in size.   Right Atrium: Right atrial size was normal in size.   Pericardium: There is no evidence of pericardial effusion.   Mitral Valve: The mitral valve is normal in structure. Mild mitral valve  regurgitation. No evidence of mitral valve stenosis.   Tricuspid Valve: The tricuspid valve is normal in structure. Tricuspid  valve regurgitation is not  demonstrated. No evidence of tricuspid  stenosis.   Aortic Valve: Gradient is less this time as compare to echocardiogram from  12/30/2019. Most likely missed higher velocity jet of AS at this study. All  other parameters indicate severe AS. The aortic valve is normal in  structure. Aortic valve regurgitation  is mild. Aortic regurgitation PHT measures 477 msec. Severe aortic  stenosis is present. Aortic valve mean gradient measures 30.7 mmHg. Aortic  valve peak gradient measures 53.5 mmHg. Aortic valve area, by VTI measures  0.68 cm.   Pulmonic Valve: The pulmonic valve was normal in structure. Pulmonic valve  regurgitation is not visualized. No evidence of pulmonic stenosis.   Aorta: The aortic root is normal in size and structure.   Venous: The inferior vena cava is normal in size with greater than 50%  respiratory variability, suggesting right atrial pressure of 3 mmHg.   IAS/Shunts: No atrial level shunt detected by color flow Doppler.     LEFT VENTRICLE  PLAX 2D  LVIDd:     4.17 cm Diastology  LVIDs:     2.74 cm LV e' medial:  7.94 cm/s  LV PW:     1.09 cm LV E/e' medial: 12.4  LV IVS:    1.23 cm LV e' lateral:  10.20 cm/s  LVOT diam:   1.90 cm LV E/e' lateral: 9.7  LV SV:     68  LV SV Index:  37  LVOT Area:   2.84 cm     RIGHT VENTRICLE  RV S prime:   12.20 cm/s  TAPSE (M-mode): 2.3 cm   LEFT ATRIUM       Index    RIGHT ATRIUM      Index  LA diam:    3.80 cm 2.05 cm/m RA Area:   11.70 cm  LA Vol (A2C):  43.4 ml 23.45 ml/m RA Volume:  21.80 ml 11.78 ml/m  LA Vol (A4C):  48.4 ml 26.15 ml/m  LA Biplane Vol: 48.7 ml 26.31 ml/m  AORTIC VALVE  AV Area (Vmax):  0.69 cm  AV Area (Vmean):  0.59 cm  AV Area (VTI):   0.68 cm  AV Vmax:      365.67 cm/s  AV Vmean:     256.333 cm/s  AV VTI:      0.997 m  AV Peak Grad:   53.5 mmHg  AV Mean Grad:   30.7 mmHg  LVOT Vmax:  88.80 cm/s  LVOT Vmean:    53.000 cm/s  LVOT VTI:     0.240 m  LVOT/AV VTI ratio: 0.24  AI PHT:      477 msec    AORTA  Ao Root diam: 2.70 cm  Ao Asc diam: 2.80 cm   MITRAL VALVE  MV Area (PHT): 3.99 cm  SHUNTS  MV Decel Time: 190 msec  Systemic VTI: 0.24 m  MV E velocity: 98.50 cm/s Systemic Diam: 1.90 cm  MV A velocity: 64.70 cm/s  MV E/A ratio: 1.52  STS RISK CALCULATOR: Isolated AVR: Risk of Mortality: 2.488% Renal Failure: 1.164% Permanent Stroke: 1.852% Prolonged Ventilation: 6.793% DSW Infection: 0.069% Reoperation: 2.812% Morbidity or Mortality: 11.017% Short Length of Stay: 28.043% Long Length of Stay: 5.147%  ASSESSMENT AND PLAN: 76.  87 year old woman with severe, stage D1 aortic stenosis with New York Heart Association functional class II symptoms of exertional dyspnea.  Echo images are reviewed and demonstrate normal LV systolic function with no regional wall motion abnormalities.  The aortic valve is calcified and severely restricted.  The valve is poorly visualized due to difficult acoustic windows.  Peak and mean transvalvular gradients are 54 and 31 mmHg, respectively.  Aortic valve dimensionless index is 0.24 and calculated valve area is between 0.59 and 0.69.  Gradients are lower than those on the patient's previous echo study when her mean gradient was 45 mmHg.  I have reviewed the natural history of aortic stenosis with the patient today. We have discussed the limitations of medical therapy and the poor prognosis associated with symptomatic aortic stenosis. We have reviewed potential treatment options, including palliative medical therapy, conventional surgical aortic valve replacement, and transcatheter aortic valve replacement. We discussed treatment options in the context of the patient's specific comorbid medical conditions.  We discussed the indication for further evaluation with right and left heart catheterization to  assess cardiac hemodynamics and coronary angiography to evaluate for the presence of obstructive coronary artery disease.  I have reviewed the risks, indications, and alternatives to cardiac catheterization, possible angioplasty, and stenting with the patient. Risks include but are not limited to bleeding, infection, vascular injury, stroke, myocardial infection, arrhythmia, kidney injury, radiation-related injury in the case of prolonged fluoroscopy use, emergency cardiac surgery, and death. The patient understands the risks of serious complication is 1-2 in 1505 with diagnostic cardiac cath and 1-2% or less with angioplasty/stenting.  We discussed the indication for the gated cardiac CTA and a CTA of the chest, abdomen, and pelvis to evaluate technical feasibility of transfemoral TAVR.  Once the patient's studies are completed, she understands that she would be referred for formal cardiac surgical evaluation as part of a multidisciplinary heart valve team approach to her care.  The patient would like to discuss things further with her children this weekend when they are visiting her for her 89th birthday.  She anticipates that she will want to proceed with further evaluation as outlined above.  She would like to notify us when she is ready to proceed.  We discussed cardinal symptoms of progressive aortic stenosis and she understands that it is important for her to seek medical attention if any progressive symptoms arise.  Deatra James 12/11/2020 11:14 AM     [facility] [address] [place] [address]  [phone] (office) [phone] (fax)

## 2020-12-11 ENCOUNTER — Other Ambulatory Visit: Payer: Self-pay

## 2020-12-11 ENCOUNTER — Ambulatory Visit: Payer: Medicare PPO | Admitting: Cardiovascular Disease

## 2020-12-11 ENCOUNTER — Encounter: Payer: Self-pay | Admitting: Cardiovascular Disease

## 2020-12-11 VITALS — BP 130/60 | HR 56 | Ht 65.0 in | Wt 148.8 lb

## 2020-12-11 DIAGNOSIS — I350 Nonrheumatic aortic (valve) stenosis: Secondary | ICD-10-CM

## 2020-12-11 NOTE — Patient Instructions (Addendum)
Please call us if you'd like to proceed with TAVR!

## 2020-12-12 ENCOUNTER — Ambulatory Visit: Payer: Medicare PPO | Admitting: Cardiology

## 2020-12-12 ENCOUNTER — Encounter: Payer: Self-pay | Admitting: Cardiology

## 2020-12-12 VITALS — BP 124/60 | HR 65 | Ht 66.0 in | Wt 149.0 lb

## 2020-12-12 DIAGNOSIS — I350 Nonrheumatic aortic (valve) stenosis: Secondary | ICD-10-CM | POA: Diagnosis not present

## 2020-12-12 DIAGNOSIS — I471 Supraventricular tachycardia: Secondary | ICD-10-CM | POA: Diagnosis not present

## 2020-12-12 NOTE — Progress Notes (Signed)
Electrophysiology Office Follow up Visit Note:    Date:  12/12/2020   ID:  Kim Ward, DOB 1931/06/20, MRN [id number]  PCP:  Linus Mako, NP  [facility] Cardiologist:  Kirk Ruths, MD  [facility] Electrophysiologist:  Vickie Epley, MD    Interval History:    Kim Ward is a 87 y.o. female who presents for a follow up visit.  I met the patient October 30, 2020 as a consult in the hospital.  She was admitted that hospitalization on October 26, 2020 for SVT.  She was treated with adenosine and loaded with amiodarone in an effort to avoid invasive management.  Since that time she is being worked up for TAVR and was seen by Dr. Burt Knack on December 11, 2020.  Since I saw her, she tells me she has been doing overall better.  The burden of her episodes of tachycardia significantly decrease in starting amiodarone.  She is currently undergoing a work-up for possible TAVR with Dr. Burt Knack.     Past Medical History:  Diagnosis Date  . Adenomatous colon polyp   . Anginal pain ([place])   . Aortic stenosis   . Depression    Tx'd 19 yrs ago when husband passed away  . Diverticulosis   . Dysrhythmia   . Heart murmur   . Hyperlipidemia   . Hypertension   . Hypothyroidism    Dr. Dwyane Dee follows pt.--had small nodules but had decreased in size and stopped Synthroid 06/2013  . Osteopenia   . Osteoporosis   . Pure hypercholesterolemia   . SVT (supraventricular tachycardia) (HCC)     Past Surgical History:  Procedure Laterality Date  . CERVICAL BIOPSY  W/ LOOP ELECTRODE EXCISION  1996   CIN I  . COLONOSCOPY  03/2001, 01/2007   2008 - no polyps  . DILATION AND CURETTAGE OF UTERUS    . TONSILLECTOMY      Current Medications: Current Meds  Medication Sig  . ALPRAZolam (XANAX) 0.5 MG tablet Take 0.5 mg by mouth 2 (two) times daily as needed for anxiety or sleep.  [name] amiodarone (PACERONE) 200 MG tablet Take 1 tablet (200 mg total) by mouth daily.  [name] amLODipine (NORVASC) 2.5  MG tablet Take 1 tablet (2.5 mg total) by mouth daily.  [name] amoxicillin (AMOXIL) 500 MG capsule Take 500 mg by mouth as directed. 4 capsules before dental procedures  . BIOTIN PO Take 1 tablet by mouth daily.   . calcium-vitamin D (OSCAL WITH D) 500-200 MG-UNIT tablet Take 1 tablet by mouth daily with breakfast.  . ibuprofen (ADVIL) 200 MG tablet Take 400 mg by mouth every 6 (six) hours as needed. For pain  . loratadine (CLARITIN) 10 MG tablet Take 10 mg by mouth daily.  . pindolol (VISKEN) 10 MG tablet Take 1 tablet (10 mg total) by mouth 2 (two) times daily.  . rosuvastatin (CRESTOR) 5 MG tablet Take 5 mg by mouth every Monday, Wednesday, and Friday.  . zolpidem (AMBIEN) 5 MG tablet Take 5 mg by mouth at bedtime as needed for sleep.     Allergies:   Augmentin [amoxicillin-pot clavulanate], Azithromycin, Bextra [valdecoxib], Cefuroxime, Cefuroxime axetil, Etodolac, Humibid la [guaifenesin], Lipitor [atorvastatin calcium], Ramipril, Risedronate sodium, Tramadol, Zocor [simvastatin], and Niaspan [niacin er]   Social History   Socioeconomic History  . Marital status: Widowed    Spouse name: Not on file  . Number of children: 2  . Years of education: Not on file  . Highest education  level: Not on file  Occupational History  . Occupation: Retired  Tobacco Use  . Smoking status: Former Smoker    Quit date: 06/17/1972    Years since quitting: 48.5  . Smokeless tobacco: Never Used  . Tobacco comment: quit June 17, 1972  Vaping Use  . Vaping Use: Never used  Substance and Sexual Activity  . Alcohol use: Yes    Alcohol/week: 0.0 standard drinks    Comment: 2 drinks per month  . Drug use: No  . Sexual activity: Never    Partners: Male    Birth control/protection: Post-menopausal  Other Topics Concern  . Not on file  Social History Narrative  . Not on file   Social Determinants of Health   Financial Resource Strain: Not on file  Food Insecurity: Not on file  Transportation Needs:  Not on file  Physical Activity: Not on file  Stress: Not on file  Social Connections: Not on file     Family History: The patient's family history includes Breast cancer in her sister; Breast cancer (age of onset: 25) in her daughter; Stroke in her sister.  ROS:   Please see the history of present illness.    All other systems reviewed and are negative.  EKGs/Labs/Other Studies Reviewed:    The following studies were reviewed today:   Recent Labs: 10/26/2020: ALT 15 10/28/2020: Magnesium 1.9; TSH 1.259 10/30/2020: BUN 14; Creatinine, Ser 0.95; Hemoglobin 13.4; Platelets 246; Potassium 4.1; Sodium 142  Recent Lipid Panel    Component Value Date/Time   CHOL 153 06/24/2016 1410   TRIG 156 (H) 06/24/2016 1410   HDL 55 06/24/2016 1410   CHOLHDL 2.8 06/24/2016 1410   VLDL 31 (H) 06/24/2016 1410   LDLCALC 67 06/24/2016 1410   LDLDIRECT 140.2 06/24/2014 0843    Physical Exam:    VS:  BP 124/60   Pulse 65   Ht '5\' 6"'  (1.676 m)   Wt 149 lb (67.6 kg)   LMP 11/19/1983   SpO2 98%   BMI 24.05 kg/m     Wt Readings from Last 3 Encounters:  12/12/20 149 lb (67.6 kg)  12/11/20 148 lb 12.8 oz (67.5 kg)  12/06/20 148 lb 12.8 oz (67.5 kg)     GEN: Well nourished, well developed in no acute distress HEENT: Normal NECK: No JVD; No carotid bruits LYMPHATICS: No lymphadenopathy CARDIAC: RRR, no murmurs, rubs, gallops RESPIRATORY:  Clear to auscultation without rales, wheezing or rhonchi  ABDOMEN: Soft, non-tender, non-distended MUSCULOSKELETAL:  No edema; No deformity  SKIN: Warm and dry NEUROLOGIC:  Alert and oriented x 3 PSYCHIATRIC:  Normal affect   ASSESSMENT:    1. SVT (supraventricular tachycardia) ([place])   2. Severe aortic stenosis    PLAN:    In order of problems listed above:  1. SVT Likely AV nodal reentrant tachycardia based on ECG.  Rhythm is significantly improved since starting amiodarone.  Given her severe aortic valve stenosis, would favor a noninvasive  approach to managing her SVT if possible.  For now, continue amiodarone.  I will plan to touch base with the patient in 3 months to see how she is doing.  2.  Severe aortic stenosis Currently undergoing work-up for TAVR with Dr. Burt Knack.  Patient ultimately requires EP study and ablation for recurrent SVT, would favor doing this after TAVR.   Medication Adjustments/Labs and Tests Ordered: Current medicines are reviewed at length with the patient today.  Concerns regarding medicines are outlined above.  No orders of  the defined types were placed in this encounter.  No orders of the defined types were placed in this encounter.    Signed, Lars Mage, MD, [facility]  12/12/2020 3:00 PM    Electrophysiology [place]

## 2020-12-12 NOTE — Patient Instructions (Addendum)
Medication Instructions:  Your physician recommends that you continue on your current medications as directed. Please refer to the Current Medication list given to you today.  Labwork: None ordered.  Testing/Procedures: None ordered.  Follow-Up: Your physician wants you to follow-up in: 3 months with Dr. Quentin Ore.     March 13, 2021 at at 10:45 am at the [facility] office  Any Other Special Instructions Will Be Listed Below (If Applicable).  If you need a refill on your cardiac medications before your next appointment, please call your pharmacy.

## 2020-12-25 ENCOUNTER — Telehealth: Payer: Self-pay | Admitting: Cardiology

## 2020-12-25 NOTE — Telephone Encounter (Signed)
New Message:    Pt said her appt for Dr Stanford Breed was cancelled on 12-27-20 and she would like to know why please. She said she does not want another day, she wants to come Wednesday please.

## 2020-12-26 NOTE — Progress Notes (Signed)
HPI: FU paroxysmal supraventricular tachycardia, AS,hypercholesterolemia, and benign hypertensive heart disease.Nuclear study December 2013 showed ejection fraction 73% and normal perfusion. EchocardiogramFebruary 2021 showed normal LV systolic function, mild left ventricular hypertrophy, grade 2 diastolic dysfunction, severe aortic stenosis with mean gradient 45 mmHg, trace aortic insufficiency and mild left atrial enlargement. Echocardiogram October 2021 showed normal LV function, mild left ventricular hypertrophy, grade 2 diastolic dysfunction, mild mitral regurgitation, moderate to severe aortic stenosis with mean gradient 31 mmHg and mild aortic insufficiency.  Monitor December 2021 showed sinus rhythm with PAC and PVC.  Patient admitted in December 2021 with recurrent SVT.  Her beta-blocker was increased and she was discharged.  However she returned with recurrent SVT and was placed on amiodarone.  Patient seen by Dr. Quentin Ore and conservative measures felt indicated. Seen by Dr Burt Knack 1/22 and TAVR W/U recommended. Seen by Dr Quentin Ore 1/22 for SVT and continued medical therapy with amiodarone recommended. Since last seen,  she has some dyspnea on exertion but no orthopnea, PND, pedal edema, chest pain or syncope.  She has had no recurrent SVT.  Current Outpatient Medications  Medication Sig Dispense Refill  . ALPRAZolam (XANAX) 0.5 MG tablet Take 0.5 mg by mouth 2 (two) times daily as needed for anxiety or sleep.    [name] amiodarone (PACERONE) 200 MG tablet Take 1 tablet (200 mg total) by mouth daily. 30 tablet 3  . amLODipine (NORVASC) 2.5 MG tablet Take 1 tablet (2.5 mg total) by mouth daily. 30 tablet 11  . amoxicillin (AMOXIL) 500 MG capsule Take 500 mg by mouth as directed. 4 capsules before dental procedures    . BIOTIN PO Take 1 tablet by mouth daily.     . calcium-vitamin D (OSCAL WITH D) 500-200 MG-UNIT tablet Take 1 tablet by mouth daily with breakfast.    . ibuprofen (ADVIL) 200 MG  tablet Take 400 mg by mouth every 6 (six) hours as needed. For pain    . loratadine (CLARITIN) 10 MG tablet Take 10 mg by mouth daily.    . pindolol (VISKEN) 10 MG tablet Take 1 tablet (10 mg total) by mouth 2 (two) times daily. 60 tablet 6  . rosuvastatin (CRESTOR) 5 MG tablet Take 5 mg by mouth every Monday, Wednesday, and Friday.    . zolpidem (AMBIEN) 5 MG tablet Take 5 mg by mouth at bedtime as needed for sleep.     No current facility-administered medications for this visit.     Past Medical History:  Diagnosis Date  . Adenomatous colon polyp   . Anginal pain ([place])   . Aortic stenosis   . Depression    Tx'd 19 yrs ago when husband passed away  . Diverticulosis   . Dysrhythmia   . Heart murmur   . Hyperlipidemia   . Hypertension   . Hypothyroidism    Dr. Dwyane Dee follows pt.--had small nodules but had decreased in size and stopped Synthroid 06/2013  . Osteopenia   . Osteoporosis   . Pure hypercholesterolemia   . SVT (supraventricular tachycardia) (HCC)     Past Surgical History:  Procedure Laterality Date  . CERVICAL BIOPSY  W/ LOOP ELECTRODE EXCISION  1996   CIN I  . COLONOSCOPY  03/2001, 01/2007   2008 - no polyps  . DILATION AND CURETTAGE OF UTERUS    . TONSILLECTOMY      Social History   Socioeconomic History  . Marital status: Widowed    Spouse name: Not on file  .  Number of children: 2  . Years of education: Not on file  . Highest education level: Not on file  Occupational History  . Occupation: Retired  Tobacco Use  . Smoking status: Former Smoker    Quit date: 06/17/1972    Years since quitting: 48.5  . Smokeless tobacco: Never Used  . Tobacco comment: quit June 17, 1972  Vaping Use  . Vaping Use: Never used  Substance and Sexual Activity  . Alcohol use: Yes    Alcohol/week: 0.0 standard drinks    Comment: 2 drinks per month  . Drug use: No  . Sexual activity: Never    Partners: Male    Birth control/protection: Post-menopausal  Other Topics  Concern  . Not on file  Social History Narrative  . Not on file   Social Determinants of Health   Financial Resource Strain: Not on file  Food Insecurity: Not on file  Transportation Needs: Not on file  Physical Activity: Not on file  Stress: Not on file  Social Connections: Not on file  Intimate Partner Violence: Not on file    Family History  Problem Relation Age of Onset  . Stroke Sister   . Breast cancer Sister   . Breast cancer Daughter 35       metastatic breast ca    ROS: no fevers or chills, productive cough, hemoptysis, dysphasia, odynophagia, melena, hematochezia, dysuria, hematuria, rash, seizure activity, orthopnea, PND, pedal edema, claudication. Remaining systems are negative.  Physical Exam: Well-developed well-nourished in no acute distress.  Skin is warm and dry.  HEENT is normal.  Neck is supple.  Chest is clear to auscultation with normal expansion.  Cardiovascular exam is regular rate and rhythm.  3/6 systolic murmur left sternal border. Abdominal exam nontender or distended. No masses palpated. Extremities show no edema. neuro grossly intact  ECG-sinus rhythm with PAC, first-degree AV block, left ventricular hypertrophy.  Personally reviewed  A/P  1 SVT-recently seen by Dr Quentin Ore and continued medical therapy recommended; can consider ablation for refractory symptoms. Continue amiodarone and pindolol; in 3 months we will check TSH, liver functions and chest x-ray.   2 severe aortic stenosis-seen by Dr Burt Knack recently and TAVR wu recommended.  She has reviewed this with her family and she is now ready to proceed.  We will discuss with the structural team and initiate process.  She will have right and left cardiac catheterization.  The risks and benefits including myocardial infarction, CVA and death discussed and she agrees to proceed.  3 hypertension-patient's blood pressure is borderline.  We will follow and adjust medications as needed.  4  hyperlipidemia-continue medical therapy with statin.  Kirk Ruths, MD

## 2020-12-26 NOTE — H&P (View-Only) (Signed)
HPI: FU paroxysmal supraventricular tachycardia, AS,hypercholesterolemia, and benign hypertensive heart disease.Nuclear study December 2013 showed ejection fraction 73% and normal perfusion. EchocardiogramFebruary 2021 showed normal LV systolic function, mild left ventricular hypertrophy, grade 2 diastolic dysfunction, severe aortic stenosis with mean gradient 45 mmHg, trace aortic insufficiency and mild left atrial enlargement. Echocardiogram October 2021 showed normal LV function, mild left ventricular hypertrophy, grade 2 diastolic dysfunction, mild mitral regurgitation, moderate to severe aortic stenosis with mean gradient 31 mmHg and mild aortic insufficiency.  Monitor December 2021 showed sinus rhythm with PAC and PVC.  Patient admitted in December 2021 with recurrent SVT.  Her beta-blocker was increased and she was discharged.  However she returned with recurrent SVT and was placed on amiodarone.  Patient seen by Dr. Quentin Ore and conservative measures felt indicated. Seen by Dr Burt Knack 1/22 and TAVR W/U recommended. Seen by Dr Quentin Ore 1/22 for SVT and continued medical therapy with amiodarone recommended. Since last seen,  she has some dyspnea on exertion but no orthopnea, PND, pedal edema, chest pain or syncope.  She has had no recurrent SVT.  Current Outpatient Medications  Medication Sig Dispense Refill  . ALPRAZolam (XANAX) 0.5 MG tablet Take 0.5 mg by mouth 2 (two) times daily as needed for anxiety or sleep.    [name] amiodarone (PACERONE) 200 MG tablet Take 1 tablet (200 mg total) by mouth daily. 30 tablet 3  . amLODipine (NORVASC) 2.5 MG tablet Take 1 tablet (2.5 mg total) by mouth daily. 30 tablet 11  . amoxicillin (AMOXIL) 500 MG capsule Take 500 mg by mouth as directed. 4 capsules before dental procedures    . BIOTIN PO Take 1 tablet by mouth daily.     . calcium-vitamin D (OSCAL WITH D) 500-200 MG-UNIT tablet Take 1 tablet by mouth daily with breakfast.    . ibuprofen (ADVIL) 200 MG  tablet Take 400 mg by mouth every 6 (six) hours as needed. For pain    . loratadine (CLARITIN) 10 MG tablet Take 10 mg by mouth daily.    . pindolol (VISKEN) 10 MG tablet Take 1 tablet (10 mg total) by mouth 2 (two) times daily. 60 tablet 6  . rosuvastatin (CRESTOR) 5 MG tablet Take 5 mg by mouth every Monday, Wednesday, and Friday.    . zolpidem (AMBIEN) 5 MG tablet Take 5 mg by mouth at bedtime as needed for sleep.     No current facility-administered medications for this visit.     Past Medical History:  Diagnosis Date  . Adenomatous colon polyp   . Anginal pain ([place])   . Aortic stenosis   . Depression    Tx'd 19 yrs ago when husband passed away  . Diverticulosis   . Dysrhythmia   . Heart murmur   . Hyperlipidemia   . Hypertension   . Hypothyroidism    Dr. Dwyane Dee follows pt.--had small nodules but had decreased in size and stopped Synthroid 06/2013  . Osteopenia   . Osteoporosis   . Pure hypercholesterolemia   . SVT (supraventricular tachycardia) (HCC)     Past Surgical History:  Procedure Laterality Date  . CERVICAL BIOPSY  W/ LOOP ELECTRODE EXCISION  1996   CIN I  . COLONOSCOPY  03/2001, 01/2007   2008 - no polyps  . DILATION AND CURETTAGE OF UTERUS    . TONSILLECTOMY      Social History   Socioeconomic History  . Marital status: Widowed    Spouse name: Not on file  .  Number of children: 2  . Years of education: Not on file  . Highest education level: Not on file  Occupational History  . Occupation: Retired  Tobacco Use  . Smoking status: Former Smoker    Quit date: 06/17/1972    Years since quitting: 48.5  . Smokeless tobacco: Never Used  . Tobacco comment: quit June 17, 1972  Vaping Use  . Vaping Use: Never used  Substance and Sexual Activity  . Alcohol use: Yes    Alcohol/week: 0.0 standard drinks    Comment: 2 drinks per month  . Drug use: No  . Sexual activity: Never    Partners: Male    Birth control/protection: Post-menopausal  Other Topics  Concern  . Not on file  Social History Narrative  . Not on file   Social Determinants of Health   Financial Resource Strain: Not on file  Food Insecurity: Not on file  Transportation Needs: Not on file  Physical Activity: Not on file  Stress: Not on file  Social Connections: Not on file  Intimate Partner Violence: Not on file    Family History  Problem Relation Age of Onset  . Stroke Sister   . Breast cancer Sister   . Breast cancer Daughter 17       metastatic breast ca    ROS: no fevers or chills, productive cough, hemoptysis, dysphasia, odynophagia, melena, hematochezia, dysuria, hematuria, rash, seizure activity, orthopnea, PND, pedal edema, claudication. Remaining systems are negative.  Physical Exam: Well-developed well-nourished in no acute distress.  Skin is warm and dry.  HEENT is normal.  Neck is supple.  Chest is clear to auscultation with normal expansion.  Cardiovascular exam is regular rate and rhythm.  3/6 systolic murmur left sternal border. Abdominal exam nontender or distended. No masses palpated. Extremities show no edema. neuro grossly intact  ECG-sinus rhythm with PAC, first-degree AV block, left ventricular hypertrophy.  Personally reviewed  A/P  1 SVT-recently seen by Dr Quentin Ore and continued medical therapy recommended; can consider ablation for refractory symptoms. Continue amiodarone and pindolol; in 3 months we will check TSH, liver functions and chest x-ray.   2 severe aortic stenosis-seen by Dr Burt Knack recently and TAVR wu recommended.  She has reviewed this with her family and she is now ready to proceed.  We will discuss with the structural team and initiate process.  She will have right and left cardiac catheterization.  The risks and benefits including myocardial infarction, CVA and death discussed and she agrees to proceed.  3 hypertension-patient's blood pressure is borderline.  We will follow and adjust medications as needed.  4  hyperlipidemia-continue medical therapy with statin.  Kirk Ruths, MD

## 2020-12-27 ENCOUNTER — Ambulatory Visit: Payer: Medicare PPO | Admitting: Cardiology

## 2020-12-27 ENCOUNTER — Encounter: Payer: Self-pay | Admitting: Cardiology

## 2020-12-27 ENCOUNTER — Other Ambulatory Visit: Payer: Self-pay

## 2020-12-27 VITALS — BP 152/66 | HR 64 | Ht 65.0 in | Wt 152.4 lb

## 2020-12-27 DIAGNOSIS — I350 Nonrheumatic aortic (valve) stenosis: Secondary | ICD-10-CM

## 2020-12-27 DIAGNOSIS — I471 Supraventricular tachycardia: Secondary | ICD-10-CM | POA: Diagnosis not present

## 2020-12-27 DIAGNOSIS — E785 Hyperlipidemia, unspecified: Secondary | ICD-10-CM | POA: Diagnosis not present

## 2020-12-27 DIAGNOSIS — I10 Essential (primary) hypertension: Secondary | ICD-10-CM

## 2020-12-27 MED ORDER — SODIUM CHLORIDE 0.9% FLUSH: 3.0000 mL | Freq: Two times a day (BID) | INTRAVENOUS | Status: AC

## 2020-12-27 NOTE — Patient Instructions (Addendum)
    [facility] [place] [address], [address] [place] [address] Dept: [phone] Loc: [phone]  Kim Ward  12/27/2020   GO TO [place] Tuesday 01/09/21 @ 11:30 AM FOR COVID TESTING  You are scheduled for a Cardiac Catheterization on Friday, February 25 with Dr. Sherren Mocha.  1. Please arrive at the [facility] (Main Entrance A) at [facility]: [address] [place], [place] [address] at 10:00 AM (This time is two hours before your procedure to ensure your preparation). Free valet parking service is available.   Special note: Every effort is made to have your procedure done on time. Please understand that emergencies sometimes delay scheduled procedures.  2. Diet: Do not eat solid foods after midnight.  The patient may have clear liquids until 5am upon the day of the procedure.  3. Labs: You will need to have blood drawn TODAY  4. Medication instructions in preparation for your procedure:  TAKE ALL MEDICATION AS SCHEDULED PRIOR TO THE PROCEDURE  5. Plan for one night stay--bring personal belongings. 6. Bring a current list of your medications and current insurance cards. 7. You MUST have a responsible person to drive you home. 8. Someone MUST be with you the first 24 hours after you arrive home or your discharge will be delayed. 9. Please wear clothes that are easy to get on and off and wear slip-on shoes.  Thank you for allowing [place] to care for you!   -- [facility]

## 2020-12-28 ENCOUNTER — Encounter: Payer: Self-pay | Admitting: *Deleted

## 2020-12-28 LAB — CBC
Hematocrit: 40.5 % (ref 34.0–46.6)
Hemoglobin: 13.2 g/dL (ref 11.1–15.9)
MCH: 28.6 pg (ref 26.6–33.0)
MCHC: 32.6 g/dL (ref 31.5–35.7)
MCV: 88 fL (ref 79–97)
Platelets: 312 10*3/uL (ref 150–450)
RBC: 4.61 x10E6/uL (ref 3.77–5.28)
RDW: 14.3 % (ref 11.7–15.4)
WBC: 8.4 10*3/uL (ref 3.4–10.8)

## 2020-12-28 LAB — BASIC METABOLIC PANEL
BUN/Creatinine Ratio: 17 (ref 12–28)
BUN: 19 mg/dL (ref 8–27)
CO2: 20 mmol/L (ref 20–29)
Calcium: 9.5 mg/dL (ref 8.7–10.3)
Chloride: 103 mmol/L (ref 96–106)
Creatinine, Ser: 1.09 mg/dL — ABNORMAL HIGH (ref 0.57–1.00)
GFR calc Af Amer: 52 mL/min/{1.73_m2} — ABNORMAL LOW (ref >59)
GFR calc non Af Amer: 45 mL/min/{1.73_m2} — ABNORMAL LOW (ref >59)
Glucose: 83 mg/dL (ref 65–99)
Potassium: 4.8 mmol/L (ref 3.5–5.2)
Sodium: 140 mmol/L (ref 134–144)

## 2021-01-09 ENCOUNTER — Other Ambulatory Visit (HOSPITAL_COMMUNITY)
Admission: RE | Admit: 2021-01-09 | Discharge: 2021-01-09 | Disposition: A | Discharge status: Home / Self Care | Payer: Medicare PPO | Source: Ambulatory Visit | Attending: Cardiovascular Disease | Admitting: Cardiovascular Disease

## 2021-01-09 DIAGNOSIS — Z20822 Contact with and (suspected) exposure to covid-19: Secondary | ICD-10-CM | POA: Diagnosis not present

## 2021-01-09 DIAGNOSIS — Z01812 Encounter for preprocedural laboratory examination: Secondary | ICD-10-CM | POA: Insufficient documentation

## 2021-01-09 LAB — SARS CORONAVIRUS 2 (TAT 6-24 HRS): SARS Coronavirus 2: NEGATIVE

## 2021-01-11 ENCOUNTER — Telehealth: Payer: Self-pay | Admitting: *Deleted

## 2021-01-11 NOTE — Telephone Encounter (Deleted)
See notes

## 2021-01-11 NOTE — Telephone Encounter (Addendum)
Pt contacted pre-catheterization scheduled at [facility] for: Friday January 12, 2021 12 Noon Verified arrival time and place: [facility]) at: 7 AM-pre-procedure hydration.  No solid food after midnight prior to cath, clear liquids until 5 AM day of procedure.  Hold: Ibuprofen-day before and day of procedure-GFR 45  Except hold medications AM meds can be  taken pre-cath with sips of water including: ASA 81 mg   Confirmed patient has responsible adult to drive home post procedure and be with patient first 24 hours after arriving home: yes  You are allowed ONE visitor in the waiting room during the time you are at the hospital for your procedure. Both you and your visitor must wear a mask once you enter the hospital.  Reviewed procedure/mask/visitor instructions with patient.  Discussed with patient arrival time 7 AM instead of 10 AM  to allow time for IV hydration prior to procedure at 12 Noon. Pt states her daughter is coming from [place] to take her to hospital, she will be there as close to 7 AM as possible, knows okay if she is a little later than 7 AM.

## 2021-01-12 ENCOUNTER — Ambulatory Visit (HOSPITAL_COMMUNITY)
Admission: RE | Admit: 2021-01-12 | Discharge: 2021-01-12 | Disposition: A | Discharge status: Home / Self Care | Payer: Medicare PPO | Attending: Cardiovascular Disease | Admitting: Cardiovascular Disease

## 2021-01-12 ENCOUNTER — Other Ambulatory Visit: Payer: Self-pay

## 2021-01-12 ENCOUNTER — Ambulatory Visit (HOSPITAL_COMMUNITY)
Admission: RE | Disposition: A | Discharge status: Home / Self Care | Payer: Self-pay | Source: Home / Self Care | Attending: Cardiovascular Disease

## 2021-01-12 DIAGNOSIS — E785 Hyperlipidemia, unspecified: Secondary | ICD-10-CM | POA: Diagnosis not present

## 2021-01-12 DIAGNOSIS — I471 Supraventricular tachycardia: Secondary | ICD-10-CM | POA: Insufficient documentation

## 2021-01-12 DIAGNOSIS — Z79899 Other long term (current) drug therapy: Secondary | ICD-10-CM | POA: Insufficient documentation

## 2021-01-12 DIAGNOSIS — I10 Essential (primary) hypertension: Secondary | ICD-10-CM | POA: Diagnosis not present

## 2021-01-12 DIAGNOSIS — I350 Nonrheumatic aortic (valve) stenosis: Secondary | ICD-10-CM

## 2021-01-12 DIAGNOSIS — Z87891 Personal history of nicotine dependence: Secondary | ICD-10-CM | POA: Diagnosis not present

## 2021-01-12 HISTORY — PX: LEFT HEART CATH AND CORONARY ANGIOGRAPHY: CATH118249

## 2021-01-12 SURGERY — LEFT HEART CATH AND CORONARY ANGIOGRAPHY
Anesthesia: LOCAL

## 2021-01-12 MED ORDER — SODIUM CHLORIDE 0.9 % WEIGHT BASED INFUSION
3.0000 mL/kg/h | INTRAVENOUS | Status: AC
  Administered 2021-01-12: 3 mL/kg/h via INTRAVENOUS

## 2021-01-12 MED ORDER — ONDANSETRON HCL 4 MG/2ML IJ SOLN: 4.0000 mg | Freq: Four times a day (QID) | INTRAMUSCULAR | Status: DC | PRN

## 2021-01-12 MED ORDER — IOHEXOL 350 MG/ML SOLN
INTRAVENOUS | Status: DC | PRN
  Administered 2021-01-12: 40 mL

## 2021-01-12 MED ORDER — SODIUM CHLORIDE 0.9% FLUSH: 3.0000 mL | Freq: Two times a day (BID) | INTRAVENOUS | Status: DC

## 2021-01-12 MED ORDER — HYDRALAZINE HCL 20 MG/ML IJ SOLN: 10.0000 mg | INTRAMUSCULAR | Status: DC | PRN

## 2021-01-12 MED ORDER — SODIUM CHLORIDE 0.9 % WEIGHT BASED INFUSION: 1.0000 mL/kg/h | INTRAVENOUS | Status: DC

## 2021-01-12 MED ORDER — FENTANYL CITRATE (PF) 100 MCG/2ML IJ SOLN
INTRAMUSCULAR | Status: DC | PRN
  Administered 2021-01-12: 25 ug via INTRAVENOUS

## 2021-01-12 MED ORDER — SODIUM CHLORIDE 0.9% FLUSH: 3.0000 mL | INTRAVENOUS | Status: DC | PRN

## 2021-01-12 MED ORDER — MIDAZOLAM HCL 2 MG/2ML IJ SOLN
INTRAMUSCULAR | Status: DC | PRN
  Administered 2021-01-12: 1 mg via INTRAVENOUS

## 2021-01-12 MED ORDER — VERAPAMIL HCL 2.5 MG/ML IV SOLN
INTRAVENOUS | Status: DC | PRN
  Administered 2021-01-12: 10 mL via INTRA_ARTERIAL

## 2021-01-12 MED ORDER — HEPARIN (PORCINE) IN NACL 1000-0.9 UT/500ML-% IV SOLN
INTRAVENOUS | Status: DC | PRN
Start: 2021-01-12 — End: 2021-01-12
  Administered 2021-01-12: 500 mL

## 2021-01-12 MED ORDER — HEPARIN (PORCINE) IN NACL 1000-0.9 UT/500ML-% IV SOLN
INTRAVENOUS | Status: AC
  Filled 2021-01-12: qty 1000

## 2021-01-12 MED ORDER — SODIUM CHLORIDE 0.9 % IV SOLN: 250.0000 mL | INTRAVENOUS | Status: DC | PRN

## 2021-01-12 MED ORDER — LIDOCAINE HCL (PF) 1 % IJ SOLN
INTRAMUSCULAR | Status: AC
  Filled 2021-01-12: qty 30

## 2021-01-12 MED ORDER — ASPIRIN 81 MG PO CHEW: 81.0000 mg | CHEWABLE_TABLET | ORAL | Status: DC

## 2021-01-12 MED ORDER — ACETAMINOPHEN 325 MG PO TABS: 650.0000 mg | ORAL_TABLET | ORAL | Status: DC | PRN

## 2021-01-12 MED ORDER — MIDAZOLAM HCL 2 MG/2ML IJ SOLN
INTRAMUSCULAR | Status: AC
  Filled 2021-01-12: qty 2

## 2021-01-12 MED ORDER — VERAPAMIL HCL 2.5 MG/ML IV SOLN
INTRAVENOUS | Status: AC
  Filled 2021-01-12: qty 2

## 2021-01-12 MED ORDER — HEPARIN (PORCINE) IN NACL 1000-0.9 UT/500ML-% IV SOLN
INTRAVENOUS | Status: DC | PRN
  Administered 2021-01-12: 500 mL

## 2021-01-12 MED ORDER — LABETALOL HCL 5 MG/ML IV SOLN: 10.0000 mg | INTRAVENOUS | Status: DC | PRN

## 2021-01-12 MED ORDER — HEPARIN SODIUM (PORCINE) 1000 UNIT/ML IJ SOLN
INTRAMUSCULAR | Status: DC | PRN
  Administered 2021-01-12: 4000 [IU] via INTRAVENOUS

## 2021-01-12 MED ORDER — HEPARIN SODIUM (PORCINE) 1000 UNIT/ML IJ SOLN
INTRAMUSCULAR | Status: AC
  Filled 2021-01-12: qty 1

## 2021-01-12 MED ORDER — FENTANYL CITRATE (PF) 100 MCG/2ML IJ SOLN
INTRAMUSCULAR | Status: AC
  Filled 2021-01-12: qty 2

## 2021-01-12 SURGICAL SUPPLY — 13 items
CATH 5FR JL3.5 JR4 ANG PIG MP (CATHETERS) ×2 IMPLANT
CATH BALLN WEDGE 5F 110CM (CATHETERS) ×2 IMPLANT
COVER DOME SNAP 22 D (MISCELLANEOUS) ×2 IMPLANT
DEVICE RAD TR BAND REGULAR (VASCULAR PRODUCTS) ×2 IMPLANT
GLIDESHEATH SLEND SS 6F .021 (SHEATH) ×2 IMPLANT
GUIDEWIRE INQWIRE 1.5J.035X260 (WIRE) ×1 IMPLANT
INQWIRE 1.5J .035X260CM (WIRE) ×2
KIT HEART LEFT (KITS) ×2 IMPLANT
PACK CARDIAC CATHETERIZATION (CUSTOM PROCEDURE TRAY) ×2 IMPLANT
SHEATH GLIDE SLENDER 4/5FR (SHEATH) ×2 IMPLANT
SHEATH PROBE COVER 6X72 (BAG) ×2 IMPLANT
TRANSDUCER W/STOPCOCK (MISCELLANEOUS) ×2 IMPLANT
TUBING CIL FLEX 10 FLL-RA (TUBING) ×2 IMPLANT

## 2021-01-12 NOTE — Discharge Instructions (Signed)
Radial Site Care  This sheet gives you information about how to care for yourself after your procedure. Your health care provider may also give you more specific instructions. If you have problems or questions, contact your health care provider. What can I expect after the procedure? After the procedure, it is common to have:  Bruising and tenderness at the catheter insertion area. Follow these instructions at home: Medicines  Take over-the-counter and prescription medicines only as told by your health care provider. Insertion site care 1. Follow instructions from your health care provider about how to take care of your insertion site. Make sure you: ? Wash your hands with soap and water before you remove your bandage (dressing). If soap and water are not available, use hand sanitizer. ? May remove dressing in 24 hours. 2. Check your insertion site every day for signs of infection. Check for: ? Redness, swelling, or pain. ? Fluid or blood. ? Pus or a bad smell. ? Warmth. 3. Do no take baths, swim, or use a hot tub for 5 days. 4. You may shower 24-48 hours after the procedure. ? Remove the dressing and gently wash the site with plain soap and water. ? Pat the area dry with a clean towel. ? Do not rub the site. That could cause bleeding. 5. Do not apply powder or lotion to the site. Activity  1. For 24 hours after the procedure, or as directed by your health care provider: ? Do not flex or bend the affected arm. ? Do not push or pull heavy objects with the affected arm. ? Do not drive yourself home from the hospital or clinic. You may drive 24 hours after the procedure. ? Do not operate machinery or power tools. ? KEEP ARM ELEVATED THE REMAINDER OF THE DAY. 2. Do not push, pull or lift anything that is heavier than 10 lb for 5 days. 3. Ask your health care provider when it is okay to: ? Return to work or school. ? Resume usual physical activities or sports. ? Resume sexual  activity. General instructions  If the catheter site starts to bleed, raise your arm and put firm pressure on the site. If the bleeding does not stop, get help right away. This is a medical emergency.  DRINK PLENTY OF FLUIDS FOR THE NEXT 2-3 DAYS.  No alcohol consumption for 24 hours after receiving sedation.  If you went home on the same day as your procedure, a responsible adult should be with you for the first 24 hours after you arrive home.  Keep all follow-up visits as told by your health care provider. This is important. Contact a health care provider if:  You have a fever.  You have redness, swelling, or yellow drainage around your insertion site. Get help right away if:  You have unusual pain at the radial site.  The catheter insertion area swells very fast.  The insertion area is bleeding, and the bleeding does not stop when you hold steady pressure on the area.  Your arm or hand becomes pale, cool, tingly, or numb. These symptoms may represent a serious problem that is an emergency. Do not wait to see if the symptoms will go away. Get medical help right away. Call your local emergency services (911 in the [place] Do not drive yourself to the hospital. Summary  After the procedure, it is common to have bruising and tenderness at the site.  Follow instructions from your health care provider about how to take care   of your radial site wound. Check the wound every day for signs of infection.  This information is not intended to replace advice given to you by your health care provider. Make sure you discuss any questions you have with your health care provider. Document Revised: 12/10/2017 Document Reviewed: 12/10/2017 Elsevier Patient Education  2020 Elsevier Inc[employer] 

## 2021-01-12 NOTE — Interval H&P Note (Signed)
History and Physical Interval Note:  01/12/2021 12:41 PM  Kim Ward  has presented today for surgery, with the diagnosis of aortic stenosis.  The various methods of treatment have been discussed with the patient and family. After consideration of risks, benefits and other options for treatment, the patient has consented to  Procedure(s): RIGHT/LEFT HEART CATH AND CORONARY ANGIOGRAPHY (N/A) as a surgical intervention.  The patient's history has been reviewed, patient examined, no change in status, stable for surgery.  I have reviewed the patient's chart and labs.  Questions were answered to the patient's satisfaction.     Sherren Mocha

## 2021-01-15 ENCOUNTER — Encounter (HOSPITAL_COMMUNITY): Payer: Self-pay | Admitting: Cardiovascular Disease

## 2021-01-15 ENCOUNTER — Other Ambulatory Visit: Payer: Self-pay

## 2021-01-15 DIAGNOSIS — I350 Nonrheumatic aortic (valve) stenosis: Secondary | ICD-10-CM

## 2021-01-15 DIAGNOSIS — N289 Disorder of kidney and ureter, unspecified: Secondary | ICD-10-CM

## 2021-01-15 MED FILL — Lidocaine HCl Local Preservative Free (PF) Inj 1%: INTRAMUSCULAR | Qty: 30 | Status: AC

## 2021-01-19 ENCOUNTER — Other Ambulatory Visit (HOSPITAL_COMMUNITY): Payer: Self-pay | Admitting: *Deleted

## 2021-01-22 ENCOUNTER — Telehealth: Payer: Self-pay | Admitting: Cardiovascular Disease

## 2021-01-22 ENCOUNTER — Ambulatory Visit (HOSPITAL_COMMUNITY)
Admission: RE | Admit: 2021-01-22 | Discharge: 2021-01-22 | Disposition: A | Discharge status: Home / Self Care | Payer: Medicare PPO | Source: Ambulatory Visit | Attending: Cardiovascular Disease | Admitting: Cardiovascular Disease

## 2021-01-22 ENCOUNTER — Ambulatory Visit (HOSPITAL_BASED_OUTPATIENT_CLINIC_OR_DEPARTMENT_OTHER)
Admit: 2021-01-22 | Discharge: 2021-01-22 | Disposition: A | Discharge status: Home / Self Care | Payer: Medicare PPO | Attending: Cardiovascular Disease | Admitting: Cardiovascular Disease

## 2021-01-22 ENCOUNTER — Ambulatory Visit (HOSPITAL_COMMUNITY)
Admit: 2021-01-22 | Discharge: 2021-01-22 | Disposition: A | Discharge status: Home / Self Care | Payer: Medicare PPO | Attending: Cardiovascular Disease | Admitting: Cardiovascular Disease

## 2021-01-22 ENCOUNTER — Other Ambulatory Visit: Payer: Self-pay

## 2021-01-22 DIAGNOSIS — E785 Hyperlipidemia, unspecified: Secondary | ICD-10-CM | POA: Diagnosis not present

## 2021-01-22 DIAGNOSIS — K5730 Diverticulosis of large intestine without perforation or abscess without bleeding: Secondary | ICD-10-CM | POA: Diagnosis not present

## 2021-01-22 DIAGNOSIS — R918 Other nonspecific abnormal finding of lung field: Secondary | ICD-10-CM

## 2021-01-22 DIAGNOSIS — J90 Pleural effusion, not elsewhere classified: Secondary | ICD-10-CM | POA: Diagnosis not present

## 2021-01-22 DIAGNOSIS — I350 Nonrheumatic aortic (valve) stenosis: Secondary | ICD-10-CM

## 2021-01-22 DIAGNOSIS — R222 Localized swelling, mass and lump, trunk: Secondary | ICD-10-CM | POA: Insufficient documentation

## 2021-01-22 DIAGNOSIS — R9389 Abnormal findings on diagnostic imaging of other specified body structures: Secondary | ICD-10-CM

## 2021-01-22 DIAGNOSIS — I10 Essential (primary) hypertension: Secondary | ICD-10-CM | POA: Diagnosis not present

## 2021-01-22 DIAGNOSIS — I700 Atherosclerosis of aorta: Secondary | ICD-10-CM | POA: Diagnosis not present

## 2021-01-22 LAB — BASIC METABOLIC PANEL
Anion gap: 10 (ref 5–15)
BUN: 12 mg/dL (ref 8–23)
CO2: 22 mmol/L (ref 22–32)
Calcium: 9.2 mg/dL (ref 8.9–10.3)
Chloride: 104 mmol/L (ref 98–111)
Creatinine, Ser: 0.9 mg/dL (ref 0.44–1.00)
GFR, Estimated: >60 mL/min (ref >60)
Glucose, Bld: 93 mg/dL (ref 70–99)
Potassium: 4.4 mmol/L (ref 3.5–5.1)
Sodium: 136 mmol/L (ref 135–145)

## 2021-01-22 MED ORDER — SODIUM CHLORIDE 0.9 % WEIGHT BASED INFUSION: 1.0000 mL/kg/h | INTRAVENOUS | Status: DC

## 2021-01-22 MED ORDER — IOHEXOL 350 MG/ML SOLN
100.0000 mL | Freq: Once | INTRAVENOUS | Status: AC | PRN
  Administered 2021-01-22: 100 mL via INTRAVENOUS

## 2021-01-22 MED ORDER — SODIUM CHLORIDE 0.9 % WEIGHT BASED INFUSION
3.0000 mL/kg/h | INTRAVENOUS | Status: AC
  Administered 2021-01-22: 3 mL/kg/h via INTRAVENOUS

## 2021-01-22 NOTE — Telephone Encounter (Signed)
Malachy Mood with [facility] was calling to report incidental finding noted on this pts CT Angio Chest Aorta from today.  Over-read noted by Radiologist below, and CT is able to be viewed in Epic. Per Malachy Mood with GI, recommended is for the pt to have a PET-CT in the near future.   Report taken.  Will forward this message to pts Ordering Provider Dr. Burt Knack and RN for their review upon return to the office.  Will also include pts Primary Cardiologist and RN in on this message, as an [place].      2. Large right upper lobe mass measuring 4.7 x 3.7 x 4.5 cm with multiple prominent borderline enlarged mediastinal and hilar lymph nodes as well as an indeterminate left adrenal nodule concerning for potential metastatic disease, as detailed above. Small to moderate right pleural effusion lying dependently, concerning for potential malignant pleural effusion. Further evaluation with nonemergent PET-CT is strongly recommended in the near future to better evaluate these findings.

## 2021-01-22 NOTE — Telephone Encounter (Signed)
Kim Ward is calling to report CT results. Please advise.

## 2021-01-22 NOTE — Progress Notes (Signed)
Pre tavr has been completed.   Preliminary results in CV Proc.   Kim Ward 01/22/2021 10:51 AM

## 2021-01-22 NOTE — Telephone Encounter (Signed)
Reviewed results. Large RUL lung mass seen along with right pleural effusion. Questionable area on adrenal gland as well. PET-CT recommended. Spoke with the patient and reviewed results with her. Also called her daughter and reviewed results. Recommended that we delay further TAVR evaluation - she understands that testing and appt with Dr Roxy Manns this Thursday will be cancelled. Will order PET-CT of the chest/abd/pelvis as recommended.

## 2021-01-22 NOTE — Progress Notes (Signed)
Spoke with [name] in [place].  Let her know Kim Ward will be finished with her first hour of fluids at 1010 and was in bay 5.

## 2021-01-23 NOTE — Telephone Encounter (Signed)
  [facility]   MULTIDISCIPLINARY HEART VALVE TEAM  Pt's case was discussed in the Multidisciplinary Heart Valve team meeting today. Incidental findings noted on CTA and verbal order given to obtain PET-CT and MRI of Brain.  At this time we plan to delay evaluation for TAVR so that pt can undergo additional testing.

## 2021-01-23 NOTE — Telephone Encounter (Signed)
Pt has been scheduled for PET-CT and MRI of Brain on Friday, February 02, 2021 at [facility].  Pt should arrive in Radiology at [facility] at 6:30 AM for check-in and PET-CT scheduled at 7:00 AM. Pt should not consume any carbohydrates with evening meal on Thursday night, only water 6 hours prior to test, no gum or candy and pt will not take morning medications prior to PET scan. After PET scan is complete the pt can eat and take medications.  The pt is scheduled for MRI of brain at 12:00 PM and she will arrive in Radiology at 11:30 AM for check-in.  Pt's daughter, Mickel Baas, aware of appointments and instructions.  I have left a voicemail for the pt to return my call.

## 2021-01-24 NOTE — Telephone Encounter (Signed)
I spoke with the pt and she has spoken with her daughter and she is aware of 3/18 appointments and pre test instructions.

## 2021-01-25 ENCOUNTER — Other Ambulatory Visit (HOSPITAL_COMMUNITY): Payer: Medicare PPO

## 2021-01-25 ENCOUNTER — Ambulatory Visit: Payer: Medicare PPO | Admitting: Physical Therapy

## 2021-01-25 ENCOUNTER — Encounter: Payer: Medicare PPO | Admitting: Thoracic Surgery (Cardiothoracic Vascular Surgery)

## 2021-01-27 ENCOUNTER — Other Ambulatory Visit (HOSPITAL_COMMUNITY): Payer: Medicare PPO

## 2021-02-02 ENCOUNTER — Ambulatory Visit (HOSPITAL_COMMUNITY): Admission: RE | Admit: 2021-02-02 | Payer: Medicare PPO | Source: Ambulatory Visit

## 2021-02-02 ENCOUNTER — Ambulatory Visit (HOSPITAL_COMMUNITY): Payer: Medicare PPO

## 2021-02-08 ENCOUNTER — Ambulatory Visit (HOSPITAL_COMMUNITY): Payer: Medicare PPO

## 2021-02-12 ENCOUNTER — Other Ambulatory Visit: Payer: Self-pay | Admitting: Physician Assistant

## 2021-02-20 ENCOUNTER — Ambulatory Visit (HOSPITAL_COMMUNITY)
Admission: RE | Admit: 2021-02-20 | Discharge: 2021-02-20 | Disposition: A | Discharge status: Home / Self Care | Payer: Medicare PPO | Source: Ambulatory Visit | Attending: Cardiovascular Disease | Admitting: Cardiovascular Disease

## 2021-02-20 ENCOUNTER — Other Ambulatory Visit: Payer: Self-pay

## 2021-02-20 DIAGNOSIS — J90 Pleural effusion, not elsewhere classified: Secondary | ICD-10-CM | POA: Diagnosis not present

## 2021-02-20 DIAGNOSIS — N281 Cyst of kidney, acquired: Secondary | ICD-10-CM | POA: Diagnosis not present

## 2021-02-20 DIAGNOSIS — R918 Other nonspecific abnormal finding of lung field: Secondary | ICD-10-CM | POA: Diagnosis not present

## 2021-02-20 DIAGNOSIS — R590 Localized enlarged lymph nodes: Secondary | ICD-10-CM | POA: Diagnosis not present

## 2021-02-20 DIAGNOSIS — R9389 Abnormal findings on diagnostic imaging of other specified body structures: Secondary | ICD-10-CM | POA: Diagnosis present

## 2021-02-20 LAB — GLUCOSE, CAPILLARY: Glucose-Capillary: 89 mg/dL (ref 70–99)

## 2021-02-20 MED ORDER — GADOBUTROL 1 MMOL/ML IV SOLN
7.0000 mL | Freq: Once | INTRAVENOUS | Status: AC | PRN
  Administered 2021-02-20: 7 mL via INTRAVENOUS

## 2021-02-20 MED ORDER — FLUDEOXYGLUCOSE F - 18 (FDG) INJECTION
8.1000 | Freq: Once | INTRAVENOUS | Status: AC | PRN
  Administered 2021-02-20: 7.8 via INTRAVENOUS

## 2021-02-21 ENCOUNTER — Other Ambulatory Visit: Payer: Self-pay | Admitting: Physician Assistant

## 2021-02-21 ENCOUNTER — Telehealth: Payer: Self-pay | Admitting: *Deleted

## 2021-02-21 DIAGNOSIS — R918 Other nonspecific abnormal finding of lung field: Secondary | ICD-10-CM

## 2021-02-21 DIAGNOSIS — C7970 Secondary malignant neoplasm of unspecified adrenal gland: Secondary | ICD-10-CM

## 2021-02-21 NOTE — Telephone Encounter (Signed)
I received a referral message on Kim Ward. I called her and updated on an appt for tomorrow.  She verbalized understanding.

## 2021-02-22 ENCOUNTER — Inpatient Hospital Stay: Payer: Medicare PPO

## 2021-02-22 ENCOUNTER — Other Ambulatory Visit: Payer: Self-pay

## 2021-02-22 ENCOUNTER — Encounter: Payer: Self-pay | Admitting: *Deleted

## 2021-02-22 ENCOUNTER — Inpatient Hospital Stay: Payer: Medicare PPO | Attending: Internal Medicine | Admitting: Internal Medicine

## 2021-02-22 VITALS — BP 152/58 | HR 88 | Temp 97.3°F | Resp 17 | Ht 67.0 in | Wt 155.4 lb

## 2021-02-22 DIAGNOSIS — C7971 Secondary malignant neoplasm of right adrenal gland: Secondary | ICD-10-CM | POA: Diagnosis not present

## 2021-02-22 DIAGNOSIS — I10 Essential (primary) hypertension: Secondary | ICD-10-CM

## 2021-02-22 DIAGNOSIS — R918 Other nonspecific abnormal finding of lung field: Secondary | ICD-10-CM | POA: Insufficient documentation

## 2021-02-22 DIAGNOSIS — M810 Age-related osteoporosis without current pathological fracture: Secondary | ICD-10-CM

## 2021-02-22 DIAGNOSIS — C3431 Malignant neoplasm of lower lobe, right bronchus or lung: Secondary | ICD-10-CM | POA: Diagnosis not present

## 2021-02-22 DIAGNOSIS — C7970 Secondary malignant neoplasm of unspecified adrenal gland: Secondary | ICD-10-CM

## 2021-02-22 DIAGNOSIS — Z87891 Personal history of nicotine dependence: Secondary | ICD-10-CM

## 2021-02-22 DIAGNOSIS — C7972 Secondary malignant neoplasm of left adrenal gland: Secondary | ICD-10-CM | POA: Diagnosis not present

## 2021-02-22 DIAGNOSIS — E039 Hypothyroidism, unspecified: Secondary | ICD-10-CM

## 2021-02-22 DIAGNOSIS — Z803 Family history of malignant neoplasm of breast: Secondary | ICD-10-CM

## 2021-02-22 LAB — CMP (CANCER CENTER ONLY)
ALT: 19 U/L (ref 0–44)
AST: 17 U/L (ref 15–41)
Albumin: 3.7 g/dL (ref 3.5–5.0)
Alkaline Phosphatase: 79 U/L (ref 38–126)
Anion gap: 11 (ref 5–15)
BUN: 20 mg/dL (ref 8–23)
CO2: 23 mmol/L (ref 22–32)
Calcium: 9 mg/dL (ref 8.9–10.3)
Chloride: 105 mmol/L (ref 98–111)
Creatinine: 0.93 mg/dL (ref 0.44–1.00)
GFR, Estimated: 59 mL/min — ABNORMAL LOW (ref >60)
Glucose, Bld: 115 mg/dL — ABNORMAL HIGH (ref 70–99)
Potassium: 4.4 mmol/L (ref 3.5–5.1)
Sodium: 139 mmol/L (ref 135–145)
Total Bilirubin: 0.4 mg/dL (ref 0.3–1.2)
Total Protein: 6.8 g/dL (ref 6.5–8.1)

## 2021-02-22 LAB — CBC WITH DIFFERENTIAL (CANCER CENTER ONLY)
Abs Immature Granulocytes: 0.03 10*3/uL (ref 0.00–0.07)
Basophils Absolute: 0.1 10*3/uL (ref 0.0–0.1)
Basophils Relative: 1 %
Eosinophils Absolute: 0.1 10*3/uL (ref 0.0–0.5)
Eosinophils Relative: 2 %
HCT: 36.7 % (ref 36.0–46.0)
Hemoglobin: 11.8 g/dL — ABNORMAL LOW (ref 12.0–15.0)
Immature Granulocytes: 0 %
Lymphocytes Relative: 25 %
Lymphs Abs: 1.8 10*3/uL (ref 0.7–4.0)
MCH: 28.6 pg (ref 26.0–34.0)
MCHC: 32.2 g/dL (ref 30.0–36.0)
MCV: 89.1 fL (ref 80.0–100.0)
Monocytes Absolute: 0.7 10*3/uL (ref 0.1–1.0)
Monocytes Relative: 9 %
Neutro Abs: 4.4 10*3/uL (ref 1.7–7.7)
Neutrophils Relative %: 63 %
Platelet Count: 256 10*3/uL (ref 150–400)
RBC: 4.12 MIL/uL (ref 3.87–5.11)
RDW: 14.7 % (ref 11.5–15.5)
WBC Count: 7 10*3/uL (ref 4.0–10.5)
nRBC: 0 % (ref 0.0–0.2)

## 2021-02-22 NOTE — Progress Notes (Signed)
[place] [phone]   [phone]  CONSULT NOTE  REFERRING PHYSICIAN: Dr. Sherren Mocha  REASON FOR CONSULTATION:  87 years old white female with highly suspicious metastatic lung cancer  HPI Kim Ward is a 87 y.o. female with past medical history significant for hypertension, depression, dyslipidemia, hypothyroidism, osteoporosis, SVT, aortic stenosis and adenomatous colon polyps with remote history of smoking but quit 50 years ago.  The patient was seen by Dr. Burt Knack for severe aortic stenosis and she had CT of the coronaries as well as CT angiogram of the chest, abdomen pelvis performed on January 22, 2021 and it showed large right upper lobe lung mass measuring 4.7 x 3.7 x 4.5 cm with multiple prominent borderline enlarged mediastinal and hilar lymph nodes as well as indeterminate left adrenal nodule concerning for potential metastatic disease.  Her procedure for the TAVR was aborted and the patient had MRI of the brain on 02/20/2021 that showed no evidence of metastatic disease to the brain.  She also had a PET scan on 02/20/2021 and it showed hypermetabolic right upper lobe mass compatible with malignancy with hypermetabolic right hilar adenopathy and hypermetabolic bilateral adrenal lesions suspicious for metastatic disease.  There was trace right pleural effusion indeterminate for malignant involvement.  There was a small focus of accentuated metabolic activity in the left breast at the 12 o'clock position with maximum SUV of 3.5. Dr. Burt Knack kindly referred the patient to me today for evaluation and recommendation regarding her condition and treatment options. When seen today she is feeling fine with no concerning complaints except for shortness of breath with exertion as well as wheezing but no chest pain, cough or hemoptysis.  She lost around 10 pounds in the last few months.  She has no nausea, vomiting, diarrhea or constipation.  She has no headache  but has visual changes. Family history significant for a sister and daughter with breast cancer.  Mother died from old age and father died when she was young. The patient is a widow and had 3 children and her daughter with the breast cancer is deceased she was accompanied today by her daughter Kim Ward.  The patient is a retired Pharmacist, hospital.  She has a history of smoking for around 10 years but quit 50 years ago.  She drinks alcohol occasionally and no history of drug abuse.  HPI  Past Medical History:  Diagnosis Date  . Adenomatous colon polyp   . Depression    Tx'd 19 yrs ago when husband passed away  . Diverticulosis   . Hyperlipidemia   . Hypertension   . Hypothyroidism    Dr. Dwyane Dee follows pt.--had small nodules but had decreased in size and stopped Synthroid 06/2013  . Osteoporosis   . Pure hypercholesterolemia   . Severe aortic stenosis   . SVT (supraventricular tachycardia) (HCC)     Past Surgical History:  Procedure Laterality Date  . CERVICAL BIOPSY  W/ LOOP ELECTRODE EXCISION  1996   CIN I  . COLONOSCOPY  03/2001, 01/2007   2008 - no polyps  . DILATION AND CURETTAGE OF UTERUS    . LEFT HEART CATH AND CORONARY ANGIOGRAPHY N/A 01/12/2021   Procedure: LEFT HEART CATH AND CORONARY ANGIOGRAPHY;  Surgeon: Sherren Mocha, MD;  Location: [facility];  Service: Cardiovascular;  Laterality: N/A;  . TONSILLECTOMY      Family History  Problem Relation Age of Onset  . Stroke Sister   . Breast cancer Sister   .  Breast cancer Daughter 48       metastatic breast ca    Social History Social History   Tobacco Use  . Smoking status: Former Smoker    Quit date: 06/17/1972    Years since quitting: 48.7  . Smokeless tobacco: Never Used  . Tobacco comment: quit June 17, 1972  Vaping Use  . Vaping Use: Never used  Substance Use Topics  . Alcohol use: Yes    Alcohol/week: 0.0 standard drinks    Comment: 2 drinks per month  . Drug use: No    Allergies  Allergen Reactions   . Augmentin [Amoxicillin-Pot Clavulanate]     Unknown   . Azithromycin     Or any drug that ends in "mycin"  . Bextra [Valdecoxib]     Unknown  . Cefuroxime Other (See Comments)    dizziness  . Cefuroxime Axetil     Severe dizziness  . Etodolac Other (See Comments)    Reaction unknown  . Humibid La [Guaifenesin] Other (See Comments)    Mouth sores  . Lipitor [Atorvastatin Calcium]     MYALGIAS  . Ramipril Cough  . Risedronate Sodium      GI ISSUES  . Tramadol Other (See Comments)    Vomiting and diarrhea  . Zocor [Simvastatin]     MYALGIAS  . Niaspan [Niacin Er] Rash    Current Outpatient Medications  Medication Sig Dispense Refill  . amiodarone (PACERONE) 200 MG tablet TAKE ONE (1) TABLET BY MOUTH EVERY DAY 90 tablet 3  . amLODipine (NORVASC) 2.5 MG tablet Take 1 tablet (2.5 mg total) by mouth daily. (Patient taking differently: Take 2.5 mg by mouth at bedtime.) 30 tablet 11  . amoxicillin (AMOXIL) 500 MG capsule Take 500 mg by mouth as directed. 4 capsules before dental procedures    . BIOTIN PO Take 1 tablet by mouth daily.     . calcium-vitamin D (OSCAL WITH D) 500-200 MG-UNIT tablet Take 1 tablet by mouth daily with breakfast.    . ibuprofen (ADVIL) 200 MG tablet Take 400 mg by mouth every 6 (six) hours as needed. For pain    . pindolol (VISKEN) 10 MG tablet Take 1 tablet (10 mg total) by mouth 2 (two) times daily. 60 tablet 6  . rosuvastatin (CRESTOR) 5 MG tablet Take 5 mg by mouth every Monday, Wednesday, and Friday.     Current Facility-Administered Medications  Medication Dose Route Frequency Provider Last Rate Last Admin  . sodium chloride flush (NS) 0.9 % injection 3 mL  3 mL Intravenous Q12H Crenshaw, Denice Bors, MD        Review of Systems  Constitutional: positive for fatigue and weight loss Eyes: negative Ears, nose, mouth, throat, and face: negative Respiratory: positive for dyspnea on exertion Cardiovascular: negative Gastrointestinal:  negative Genitourinary:negative Integument/breast: negative Hematologic/lymphatic: negative Musculoskeletal:negative Neurological: negative Behavioral/Psych: negative Endocrine: negative Allergic/Immunologic: negative  Physical Exam  FFM:BWGYK, healthy, no distress, well nourished and well developed SKIN: skin color, texture, turgor are normal, no rashes or significant lesions HEAD: Normocephalic, No masses, lesions, tenderness or abnormalities EYES: normal, PERRLA, Conjunctiva are pink and non-injected EARS: External ears normal, Canals clear OROPHARYNX:no exudate, no erythema and lips, buccal mucosa, and tongue normal  NECK: supple, no adenopathy, no JVD LYMPH:  no palpable lymphadenopathy, no hepatosplenomegaly BREAST:not examined LUNGS: clear to auscultation , and palpation HEART: regular rate & rhythm, no murmurs and no gallops ABDOMEN:abdomen soft, non-tender, normal bowel sounds and no masses or organomegaly BACK: No CVA  tenderness, Range of motion is normal EXTREMITIES:no joint deformities, effusion, or inflammation, no edema  NEURO: alert & oriented x 3 with fluent speech, no focal motor/sensory deficits  PERFORMANCE STATUS: ECOG 1  LABORATORY DATA: Lab Results  Component Value Date   WBC 7.0 02/22/2021   HGB 11.8 (L) 02/22/2021   HCT 36.7 02/22/2021   MCV 89.1 02/22/2021   PLT 256 02/22/2021      Chemistry      Component Value Date/Time   NA 139 02/22/2021 1433   NA 140 12/27/2020 1137   K 4.4 02/22/2021 1433   CL 105 02/22/2021 1433   CO2 23 02/22/2021 1433   BUN 20 02/22/2021 1433   BUN 19 12/27/2020 1137   CREATININE 0.93 02/22/2021 1433   CREATININE 0.95 (H) 06/24/2016 0835      Component Value Date/Time   CALCIUM 9.0 02/22/2021 1433   ALKPHOS 79 02/22/2021 1433   AST 17 02/22/2021 1433   ALT 19 02/22/2021 1433   BILITOT 0.4 02/22/2021 1433       RADIOGRAPHIC STUDIES: MR Brain W Wo Contrast  Result Date: 02/20/2021 CLINICAL DATA:   Metastatic disease evaluation.  Lung mass. EXAM: MRI HEAD WITHOUT AND WITH CONTRAST TECHNIQUE: Multiplanar, multiecho pulse sequences of the brain and surrounding structures were obtained without and with intravenous contrast. CONTRAST:  34m GADAVIST GADOBUTROL 1 MMOL/ML IV SOLN COMPARISON:  None. FINDINGS: Brain: No acute infarction, hemorrhage, hydrocephalus, extra-axial collection or mass lesion. A few scattered foci of T2 hyperintensity are seen within the white matter of the cerebral hemispheres, nonspecific. Small developmental venous anomaly in the pons. No focus of abnormal contrast enhancement. Vascular: Normal flow voids. Skull and upper cervical spine: Normal marrow signal. Sinuses/Orbits: Bilateral lens surgery. Paranasal sinuses are clear. Other: Small bilateral mastoid effusion. IMPRESSION: 1. No evidence of intracranial metastatic disease. 2. A few scattered foci of T2 hyperintensity within the white matter of the cerebral hemispheres, nonspecific. This may represent mild chronic microvascular ischemic changes. 3. Small bilateral mastoid effusion. Electronically Signed   By: KPedro EarlsM.D.   On: 02/20/2021 16:50   NM PET Image Initial (PI) Skull Base To Thigh  Result Date: 02/21/2021 CLINICAL DATA:  Initial treatment strategy for lung nodule. EXAM: NUCLEAR MEDICINE PET SKULL BASE TO THIGH TECHNIQUE: 7.8 mCi F-18 FDG was injected intravenously. Full-ring PET imaging was performed from the skull base to thigh after the radiotracer. CT data was obtained and used for attenuation correction and anatomic localization. Fasting blood glucose: 89 mg/dl COMPARISON:  Chest CT 01/22/2021 FINDINGS: Mediastinal blood pool activity: SUV max 2.1 Liver activity: SUV max NA NECK: No significant abnormal hypermetabolic activity in this region. Incidental CT findings: Bilateral common carotid atherosclerotic calcifications. Small hypodense thyroid nodules measure up to 1.0 cm in diameter. These are  not hypermetabolic. Not clinically significant; no follow-up imaging recommended (ref: J Am Coll Radiol. 2015 Feb;12(2): 143-50). CHEST: Medial right upper lobe mass abutting the mediastinum and paraspinal region measures about 4.7 by 3.7 cm on image 50 of series 4 with maximum SUV 16.4. Reduced activity centrally compatible with central necrosis. Adjacent right hilar adenopathy as shown on CT scan, maximum SUV 7.3. Left infrahilar lymph node has faintly accentuated metabolic activity with maximum SUV 3.6. Similarly a small subcarinal node has faintly accentuated metabolic activity, maximum SUV 3.3. Left upper breast small focus of activity at the 12 o'clock position, maximum SUV 3.5. A comparable region in the normal right breast has a maximum SUV of 1.4.  Incidental CT findings: none ABDOMEN/PELVIS: The small but hypermetabolic left adrenal mass approximately 1.5 by 1.3 cm on image 102 of series 4 with maximum SUV 12.1, suspicious for metastatic disease. Precontrast internal density 33 Hounsfield units. There is likewise fullness of the right adrenal gland especially along the lateral limb which measures up to 8 mm in thickness, maximum SUV 6.8, likewise concerning for a small metastatic lesion given the high activity. Incidental CT findings: Bilateral renal cysts, some of the renal lesions are complex and too small to characterize by PET-CT, but could well simply represent complex cysts. Aortoiliac atherosclerotic vascular disease. SKELETON: Accentuated activity at the right first sternocostal joint, maximum SUV 3.4, likely inflammatory given the lack of other CT findings. Similarly, faint activity along the right upper iliac side of the SI joint, maximum SUV 3.1, likely inflammatory. No compelling findings of osseous metastatic disease. Incidental CT findings: Dextroconvex thoracolumbar scoliosis with rotary component. IMPRESSION: 1. Hypermetabolic right upper lobe mass compatible with malignancy. Hypermetabolic  right hilar adenopathy and hypermetabolic bilateral adrenal lesions suspicious for metastatic disease. 2. Trace right pleural effusion, indeterminate for malignant involvement. 3. Small focus of accentuated metabolic activity in the left breast at the 12 o'clock position, maximum SUV 3.5. On the patient's last elbow mammogram from 07/21/2018, note was made of dense breast tissue. Follow up diagnostic mammography is recommended for further assessment 4. Other imaging findings of potential clinical significance: Aortic Atherosclerosis (ICD10-I70.0). Bilateral renal cysts, along with some small complex renal lesions which are probably complex cysts but technically too small to characterize. Dextroconvex thoracolumbar scoliosis with rotary component. Electronically Signed   By: Van Clines M.D.   On: 02/21/2021 07:43    ASSESSMENT: This is a very pleasant 87 years old white female with highly suspicious stage IV (T3, in 2, M1 C) lung cancer likely non-small cell carcinoma and probably adenocarcinoma in this patient with remote history of smoking, pending tissue diagnosis.  She presented with large right lower lobe lung mass in addition to right hilar and mediastinal lymphadenopathy as well as bilateral adrenal metastasis.  She also has a suspicious hypermetabolic focus in the left breast that need further evaluation to rule out the possibility of second malignancy in the breast.   PLAN: I had a lengthy discussion with the patient and her daughter today about her current disease stage, prognosis and treatment options. I personally and independently reviewed the scan images and discussed the result and showed the images to the patient and her daughter today. I recommended for the patient to see Dr. Valeta Harms for consideration of bronchoscopy with EBUS for tissue diagnosis. I will also send the patient for diagnostic mammogram to evaluate the hypermetabolic focus in the left breast. If the final pathology is  consistent with non-small cell lung cancer, adenocarcinoma we will send the tissue for molecular studies and PD-L1 expression. I will arrange for the patient to come back for follow-up visit in around 2 weeks for more detailed discussion of her treatment options based on the final pathology and breast studies. She was advised to call immediately if she has any other concerning symptoms in the interval. The patient voices understanding of current disease status and treatment options and is in agreement with the current care plan.  All questions were answered. The patient knows to call the clinic with any problems, questions or concerns. We can certainly see the patient much sooner if necessary.  Thank you so much for allowing me to participate in the care of Kim  P Ward. I will continue to follow up the patient with you and assist in her care.  The total time spent in the appointment was 70 minutes.  Disclaimer: This note was dictated with voice recognition software. Similar sounding words can inadvertently be transcribed and may not be corrected upon review.   Eilleen Kempf February 22, 2021, 3:21 PM

## 2021-02-22 NOTE — Progress Notes (Signed)
I spoke with Kim Ward and her daughter today. Per Dr. Julien Nordmann, patient treatment plan is to be seen with pulmonary for bronchoscopy/bx and to get mammogram.  I contacted Dr. Valeta Harms to expedite her appt to see him.  I also contacted breast navigator to help coordinate mammogram.  Email to imaging center with an update on patients need for mammogram completed.

## 2021-02-23 ENCOUNTER — Encounter: Payer: Self-pay | Admitting: *Deleted

## 2021-02-23 ENCOUNTER — Telehealth: Payer: Self-pay | Admitting: *Deleted

## 2021-02-23 ENCOUNTER — Telehealth: Payer: Self-pay | Admitting: Internal Medicine

## 2021-02-23 ENCOUNTER — Other Ambulatory Visit: Payer: Self-pay | Admitting: Internal Medicine

## 2021-02-23 DIAGNOSIS — R918 Other nonspecific abnormal finding of lung field: Secondary | ICD-10-CM

## 2021-02-23 NOTE — Progress Notes (Signed)
I received a call from Dr. Valeta Harms about setting Ms. Kim Ward up for bx. I called Ms. Kim Ward and relayed message.  She would like to meet Dr. Valeta Harms before bx.  I updated him and he will notify his team to get her scheduled.

## 2021-02-23 NOTE — Telephone Encounter (Signed)
I followed up on Kim Ward schedule and her mammogram is scheduled for next month.  I called patient and stated I will talk to Dr. Julien Nordmann to see what we can do to get an earlier bx.  She was thankful for the call.

## 2021-02-23 NOTE — Telephone Encounter (Signed)
Scheduled per los. Called and spoke with patient. Confirmed appt 

## 2021-02-26 ENCOUNTER — Telehealth: Payer: Self-pay | Admitting: Pulmonary Disease

## 2021-02-26 ENCOUNTER — Encounter: Payer: Self-pay | Admitting: *Deleted

## 2021-02-26 ENCOUNTER — Other Ambulatory Visit (HOSPITAL_COMMUNITY)
Admission: RE | Admit: 2021-02-26 | Discharge: 2021-02-26 | Disposition: A | Discharge status: Home / Self Care | Payer: Medicare PPO | Source: Ambulatory Visit | Attending: Internal Medicine | Admitting: Internal Medicine

## 2021-02-26 DIAGNOSIS — R918 Other nonspecific abnormal finding of lung field: Secondary | ICD-10-CM

## 2021-02-26 DIAGNOSIS — Z01812 Encounter for preprocedural laboratory examination: Secondary | ICD-10-CM | POA: Diagnosis present

## 2021-02-26 DIAGNOSIS — Z20822 Contact with and (suspected) exposure to covid-19: Secondary | ICD-10-CM | POA: Insufficient documentation

## 2021-02-26 LAB — SARS CORONAVIRUS 2 (TAT 6-24 HRS): SARS Coronavirus 2: NEGATIVE

## 2021-02-26 NOTE — Telephone Encounter (Signed)
Kim Ward   TM  02/26/21 11:24 AM Note Informed pt of the following:  Covid test: 02/26/21 at 2:30pm   EBUS: 03/01/21 at 1:00pm; 10:30am arrival time at [facility] END[facility]. NPO after midnight.   No imaging requested.

## 2021-02-26 NOTE — Progress Notes (Signed)
I received a message that Kim Ward spoke with Dr. Valeta Harms and is willing to get her bx this week.  I did reach out to Dr. Julien Nordmann to see if there is a way to get her breast bx scheduled sooner. He asked that I contact breast navigators for this. I reached out to [facility] to see if she can help.

## 2021-02-26 NOTE — Telephone Encounter (Signed)
Great news.  I am working with the breast navigators to see if we can get her breast bx sooner than next month.

## 2021-02-26 NOTE — Telephone Encounter (Signed)
PCCM:  I called and spoke with patient as well as daughter. She is agreeable to proceed with EBUS bronchoscopy.   We will get her scheduled for this Thursday.   I will meet her the day of the case in endo to expedite her diagnosis.   She does have severe AS so we will need to work closely with our anesthesia colleagues.   Garner Nash, DO [facility] 02/26/2021 9:31 AM

## 2021-02-26 NOTE — Telephone Encounter (Signed)
Informed pt of the following:  Covid test: 02/26/21 at 2:30pm   EBUS: 03/01/21 at 1:00pm; 10:30am arrival time at [facility] after midnight.   No imaging requested.

## 2021-02-28 ENCOUNTER — Encounter (HOSPITAL_COMMUNITY): Payer: Self-pay | Admitting: Pulmonary Disease

## 2021-02-28 NOTE — Anesthesia Preprocedure Evaluation (Addendum)
Anesthesia Evaluation  Patient identified by MRN, date of birth, ID band Patient awake    Reviewed: Allergy & Precautions, NPO status , Patient's Chart, lab work & pertinent test results, reviewed documented beta blocker date and time   History of Anesthesia Complications Negative for: history of anesthetic complications  Airway Mallampati: I  TM Distance: >3 FB Neck ROM: Full    Dental  (+) Edentulous Upper, Edentulous Lower, Dental Advisory Given   Pulmonary neg pulmonary ROS, former smoker,  Covid-19 Nucleic Acid Test Results Lab Results      Component                Value               Date                      [id number]              NEGATIVE            02/26/2021                [place]              NEGATIVE            01/09/2021                [place]              NEGATIVE            10/28/2020                [place]              NEGATIVE            10/26/2020              breath sounds clear to auscultation       Cardiovascular hypertension, Pt. on medications and Pt. on home beta blockers + dysrhythmias Supra Ventricular Tachycardia + Valvular Problems/Murmurs AS  Rhythm:Regular + Systolic murmurs 6789 echo:  1. Left ventricular ejection fraction, by estimation, is 60 to 65%. The  left ventricle has normal function. The left ventricle has no regional  wall motion abnormalities. There is mild left ventricular hypertrophy.  Left ventricular diastolic parameters  are consistent with Grade II diastolic dysfunction (pseudonormalization).  2. Right ventricular systolic function is normal. The right ventricular  size is normal.  3. The mitral valve is normal in structure. Mild mitral valve  regurgitation. No evidence of mitral stenosis.  4. Gradient is less this time as compare to echocardiogram from  12/30/2019. Most likely missed higher velocity jet of AS at this study. All  other parameters indicate severe  AS. The aortic valve is normal in  structure. Aortic valve regurgitation is mild.  Severe aortic valve stenosis. Aortic valve mean gradient measures 30.7  mmHg.  5. The inferior vena cava is normal in size with greater than 50%  respiratory variability, suggesting right atrial pressure of 3 mmHg.    Neuro/Psych neg Seizures PSYCHIATRIC DISORDERS Depression negative neurological ROS     GI/Hepatic negative GI ROS, Neg liver ROS,   Endo/Other  Hypothyroidism No results found for: HGBA1C   Renal/GU negative Renal ROSLab Results      Component                Value               Date  CREATININE               0.93                02/22/2021           Lab Results      Component                Value               Date                      K                        4.4                 02/22/2021                Musculoskeletal negative musculoskeletal ROS (+)   Abdominal   Peds  Hematology  (+) Blood dyscrasia, anemia , Lab Results      Component                Value               Date                      WBC                      7.0                 02/22/2021                HGB                      11.8 (L)            02/22/2021                HCT                      36.7                02/22/2021                MCV                      89.1                02/22/2021                PLT                      256                 02/22/2021              Anesthesia Other Findings Conclusion  1. Angiographically normal coronary arteries (right dominant) 2. Calcified aortic valve with probable severe aortic stenosis (mean pressure gradient by cath is 35 mmHg)  Pt with severe, symptomatic aortic stenosis. Continue multidisciplinary evaluation for TAVR   Reproductive/Obstetrics                            Anesthesia Physical Anesthesia Plan  ASA: IV  Anesthesia Plan: General   Post-op Pain Management:    Induction:  Intravenous  PONV  Risk Score and Plan: 3 and Ondansetron, Dexamethasone, Propofol infusion and TIVA  Airway Management Planned: Oral ETT  Additional Equipment: Arterial line  Intra-op Plan:   Post-operative Plan: Extubation in OR  Informed Consent: I have reviewed the patients History and Physical, chart, labs and discussed the procedure including the risks, benefits and alternatives for the proposed anesthesia with the patient or authorized representative who has indicated his/her understanding and acceptance.     Dental advisory given  Plan Discussed with: CRNA and Surgeon  Anesthesia Plan Comments: (PAT note by Karoline Caldwell, PA-C: Pt recently undergoing eval by Dr. Burt Knack for severe aortic stenosis. Preop CT showed large right upper lobe lung mass measuring 4.7 x 3.7 x 4.5 cm with multiple prominent borderline enlarged mediastinal and hilar lymph nodes as well as indeterminate left adrenal nodule concerning for potential metastatic disease.  TAVR workup was put on hold so this could be further evaluated. PET scan 02/20/2021 showed hypermetabolic right upper lobe mass compatible with malignancy with hypermetabolic right hilar adenopathy and hypermetabolic bilateral adrenal lesions suspicious for metastatic disease.  There was trace right pleural effusion indeterminate for malignant involvement.  There was a small focus of accentuated metabolic activity in the left breast at the 12 o'clock position with maximum SUV of 3.5. Pt was referred to oncology and subsequently to pulmonology with recommendation to undergo EBUS bronchoscopy for tissue diagnosis.   CMP and CBC from 02/22/2021 reviewed, unremarkable.  Patient will need day of surgery evaluation.  Cath 01/12/21: 1. Angiographically normal coronary arteries (right dominant) 2. Calcified aortic valve with probable severe aortic stenosis (mean pressure gradient by cath is 35 mmHg)  Pt with severe, symptomatic aortic stenosis. Continue  multidisciplinary evaluation for TAVR  TTE 08/25/20: 1. Left ventricular ejection fraction, by estimation, is 60 to 65%. The  left ventricle has normal function. The left ventricle has no regional  wall motion abnormalities. There is mild left ventricular hypertrophy.  Left ventricular diastolic parameters  are consistent with Grade II diastolic dysfunction (pseudonormalization).  2. Right ventricular systolic function is normal. The right ventricular  size is normal.  3. The mitral valve is normal in structure. Mild mitral valve  regurgitation. No evidence of mitral stenosis.  4. Gradient is less this time as compare to echocardiogram from  12/30/2019. Most likely missed higher velocity jet of AS at this study. All  other parameters indicate severe AS. The aortic valve is normal in  structure. Aortic valve regurgitation is mild.  Severe aortic valve stenosis. Aortic valve mean gradient measures 30.7  mmHg.  5. The inferior vena cava is normal in size with greater than 50%  respiratory variability, suggesting right atrial pressure of 3 mmHg.  )       Anesthesia Quick Evaluation

## 2021-02-28 NOTE — Progress Notes (Signed)
Anesthesia Chart Review: Same day workup  Pt recently undergoing eval by Dr. Burt Knack for severe aortic stenosis. Preop CT showed large right upper lobe lung mass measuring 4.7 x 3.7 x 4.5 cm with multiple prominent borderline enlarged mediastinal and hilar lymph nodes as well as indeterminate left adrenal nodule concerning for potential metastatic disease.  TAVR workup was put on hold so this could be further evaluated. PET scan 02/20/2021 showed hypermetabolic right upper lobe mass compatible with malignancy with hypermetabolic right hilar adenopathy and hypermetabolic bilateral adrenal lesions suspicious for metastatic disease.  There was trace right pleural effusion indeterminate for malignant involvement.  There was a small focus of accentuated metabolic activity in the left breast at the 12 o'clock position with maximum SUV of 3.5. Pt was referred to oncology and subsequently to pulmonology with recommendation to undergo EBUS bronchoscopy for tissue diagnosis.   CMP and CBC from 02/22/2021 reviewed, unremarkable.  Patient will need day of surgery evaluation.  Cath 01/12/21: 1. Angiographically normal coronary arteries (right dominant) 2. Calcified aortic valve with probable severe aortic stenosis (mean pressure gradient by cath is 35 mmHg)  Pt with severe, symptomatic aortic stenosis. Continue multidisciplinary evaluation for TAVR  TTE 08/25/20: 1. Left ventricular ejection fraction, by estimation, is 60 to 65%. The  left ventricle has normal function. The left ventricle has no regional  wall motion abnormalities. There is mild left ventricular hypertrophy.  Left ventricular diastolic parameters  are consistent with Grade II diastolic dysfunction (pseudonormalization).  2. Right ventricular systolic function is normal. The right ventricular  size is normal.  3. The mitral valve is normal in structure. Mild mitral valve  regurgitation. No evidence of mitral stenosis.  4. Gradient is less  this time as compare to echocardiogram from  12/30/2019. Most likely missed higher velocity jet of AS at this study. All  other parameters indicate severe AS. The aortic valve is normal in  structure. Aortic valve regurgitation is mild.  Severe aortic valve stenosis. Aortic valve mean gradient measures 30.7  mmHg.  5. The inferior vena cava is normal in size with greater than 50%  respiratory variability, suggesting right atrial pressure of 3 mmHg.    Wynonia Musty [facility] Short Stay Center/Anesthesiology Phone [phone] 02/28/2021 12:46 PM

## 2021-02-28 NOTE — Progress Notes (Signed)
Patient denies shortness of breath, fever, cough or chest pain.  PCP - Dr Kirk Ruths Cardiologist - Dr Kirk Ruths  CT/Chest x-ray - 01/22/21, 10/28/20 (1v) EKG - 01/12/21 Stress Test - 10/22/12 ECHO - 08/25/20 Cardiac Cath - 01/12/21  Anesthesia review: Yes  STOP now taking any Aspirin (unless otherwise instructed by your surgeon), Aleve, Naproxen, Ibuprofen, Motrin, Advil, Goody's, BC's, all herbal medications, fish oil, and all vitamins.   Coronavirus Screening Covid test on 02/26/21 was negative.  Patient verbalized understanding of instructions that were given via phone.

## 2021-03-01 ENCOUNTER — Observation Stay (HOSPITAL_COMMUNITY): Payer: Medicare PPO

## 2021-03-01 ENCOUNTER — Ambulatory Visit (HOSPITAL_COMMUNITY): Payer: Medicare PPO | Admitting: Physician Assistant

## 2021-03-01 ENCOUNTER — Other Ambulatory Visit: Payer: Self-pay

## 2021-03-01 ENCOUNTER — Observation Stay (HOSPITAL_COMMUNITY)
Admission: RE | Admit: 2021-03-01 | Discharge: 2021-03-03 | Disposition: A | Discharge status: Home / Self Care | Payer: Medicare PPO | Attending: Pulmonary Disease | Admitting: Pulmonary Disease

## 2021-03-01 ENCOUNTER — Encounter (HOSPITAL_COMMUNITY): Payer: Self-pay | Admitting: Pulmonary Disease

## 2021-03-01 ENCOUNTER — Ambulatory Visit (HOSPITAL_COMMUNITY): Payer: Medicare PPO

## 2021-03-01 ENCOUNTER — Encounter (HOSPITAL_COMMUNITY)
Admission: RE | Disposition: A | Discharge status: Home / Self Care | Payer: Self-pay | Source: Home / Self Care | Attending: Pulmonary Disease

## 2021-03-01 DIAGNOSIS — Z79899 Other long term (current) drug therapy: Secondary | ICD-10-CM | POA: Diagnosis not present

## 2021-03-01 DIAGNOSIS — X58XXXA Exposure to other specified factors, initial encounter: Secondary | ICD-10-CM | POA: Diagnosis not present

## 2021-03-01 DIAGNOSIS — C3411 Malignant neoplasm of upper lobe, right bronchus or lung: Secondary | ICD-10-CM | POA: Insufficient documentation

## 2021-03-01 DIAGNOSIS — J95811 Postprocedural pneumothorax: Secondary | ICD-10-CM | POA: Diagnosis present

## 2021-03-01 DIAGNOSIS — E039 Hypothyroidism, unspecified: Secondary | ICD-10-CM | POA: Insufficient documentation

## 2021-03-01 DIAGNOSIS — W44F3XA Food entering into or through a natural orifice, initial encounter: Secondary | ICD-10-CM

## 2021-03-01 DIAGNOSIS — J939 Pneumothorax, unspecified: Secondary | ICD-10-CM

## 2021-03-01 DIAGNOSIS — K222 Esophageal obstruction: Secondary | ICD-10-CM | POA: Insufficient documentation

## 2021-03-01 DIAGNOSIS — R918 Other nonspecific abnormal finding of lung field: Secondary | ICD-10-CM | POA: Diagnosis present

## 2021-03-01 DIAGNOSIS — Z87891 Personal history of nicotine dependence: Secondary | ICD-10-CM | POA: Diagnosis not present

## 2021-03-01 DIAGNOSIS — Z9889 Other specified postprocedural states: Secondary | ICD-10-CM

## 2021-03-01 DIAGNOSIS — T18128A Food in esophagus causing other injury, initial encounter: Secondary | ICD-10-CM | POA: Diagnosis not present

## 2021-03-01 DIAGNOSIS — R0989 Other specified symptoms and signs involving the circulatory and respiratory systems: Secondary | ICD-10-CM

## 2021-03-01 DIAGNOSIS — R6889 Other general symptoms and signs: Secondary | ICD-10-CM

## 2021-03-01 DIAGNOSIS — I10 Essential (primary) hypertension: Secondary | ICD-10-CM | POA: Insufficient documentation

## 2021-03-01 DIAGNOSIS — K317 Polyp of stomach and duodenum: Secondary | ICD-10-CM | POA: Diagnosis not present

## 2021-03-01 HISTORY — PX: BRONCHIAL NEEDLE ASPIRATION BIOPSY: SHX5106

## 2021-03-01 HISTORY — PX: BRONCHIAL BRUSHINGS: SHX5108

## 2021-03-01 HISTORY — PX: BRONCHIAL BIOPSY: SHX5109

## 2021-03-01 HISTORY — PX: VIDEO BRONCHOSCOPY WITH ENDOBRONCHIAL ULTRASOUND: SHX6177

## 2021-03-01 HISTORY — PX: BRONCHIAL WASHINGS: SHX5105

## 2021-03-01 LAB — BASIC METABOLIC PANEL
Anion gap: 8 (ref 5–15)
BUN: 14 mg/dL (ref 8–23)
CO2: 24 mmol/L (ref 22–32)
Calcium: 9 mg/dL (ref 8.9–10.3)
Chloride: 107 mmol/L (ref 98–111)
Creatinine, Ser: 0.85 mg/dL (ref 0.44–1.00)
GFR, Estimated: >60 mL/min (ref >60)
Glucose, Bld: 91 mg/dL (ref 70–99)
Potassium: 4.3 mmol/L (ref 3.5–5.1)
Sodium: 139 mmol/L (ref 135–145)

## 2021-03-01 LAB — CBC
HCT: 37 % (ref 36.0–46.0)
Hemoglobin: 11.5 g/dL — ABNORMAL LOW (ref 12.0–15.0)
MCH: 28.6 pg (ref 26.0–34.0)
MCHC: 31.1 g/dL (ref 30.0–36.0)
MCV: 92 fL (ref 80.0–100.0)
Platelets: 243 10*3/uL (ref 150–400)
RBC: 4.02 MIL/uL (ref 3.87–5.11)
RDW: 14.7 % (ref 11.5–15.5)
WBC: 10 10*3/uL (ref 4.0–10.5)
nRBC: 0 % (ref 0.0–0.2)

## 2021-03-01 SURGERY — BRONCHOSCOPY, WITH EBUS
Anesthesia: General

## 2021-03-01 MED ORDER — GLUCAGON HCL RDNA (DIAGNOSTIC) 1 MG IJ SOLR
1.0000 mg | Freq: Once | INTRAMUSCULAR | Status: AC
  Administered 2021-03-01: 1 mg via INTRAVENOUS
  Filled 2021-03-01: qty 1

## 2021-03-01 MED ORDER — PANTOPRAZOLE SODIUM 40 MG IV SOLR: 40.0000 mg | INTRAVENOUS | Status: DC

## 2021-03-01 MED ORDER — PROPOFOL 500 MG/50ML IV EMUL
INTRAVENOUS | Status: DC | PRN
  Administered 2021-03-01: 100 ug/kg/min via INTRAVENOUS

## 2021-03-01 MED ORDER — PANTOPRAZOLE SODIUM 40 MG IV SOLR
40.0000 mg | Freq: Every day | INTRAVENOUS | Status: DC
  Administered 2021-03-01 – 2021-03-02 (×2): 40 mg via INTRAVENOUS
  Filled 2021-03-01 (×2): qty 40

## 2021-03-01 MED ORDER — SUGAMMADEX SODIUM 200 MG/2ML IV SOLN
INTRAVENOUS | Status: DC | PRN
  Administered 2021-03-01: 150 mg via INTRAVENOUS

## 2021-03-01 MED ORDER — CHLORHEXIDINE GLUCONATE 0.12 % MT SOLN
15.0000 mL | Freq: Once | OROMUCOSAL | Status: AC
  Filled 2021-03-01: qty 15

## 2021-03-01 MED ORDER — ONDANSETRON HCL 4 MG/2ML IJ SOLN
INTRAMUSCULAR | Status: DC | PRN
  Administered 2021-03-01: 4 mg via INTRAVENOUS

## 2021-03-01 MED ORDER — FENTANYL CITRATE (PF) 250 MCG/5ML IJ SOLN
INTRAMUSCULAR | Status: DC | PRN
  Administered 2021-03-01: 100 ug via INTRAVENOUS

## 2021-03-01 MED ORDER — AMIODARONE HCL 200 MG PO TABS: 200.0000 mg | ORAL_TABLET | Freq: Every day | ORAL | Status: DC

## 2021-03-01 MED ORDER — MORPHINE SULFATE (PF) 2 MG/ML IV SOLN
0.5000 mg | Freq: Once | INTRAVENOUS | Status: AC | PRN
  Administered 2021-03-01: 0.5 mg via INTRAVENOUS
  Filled 2021-03-01: qty 1

## 2021-03-01 MED ORDER — EPINEPHRINE 1 MG/10ML IJ SOSY
PREFILLED_SYRINGE | INTRAMUSCULAR | Status: AC
  Filled 2021-03-01: qty 10

## 2021-03-01 MED ORDER — ENOXAPARIN SODIUM 40 MG/0.4ML ~~LOC~~ SOLN: 40.0000 mg | SUBCUTANEOUS | Status: DC

## 2021-03-01 MED ORDER — LIDOCAINE 2% (20 MG/ML) 5 ML SYRINGE
INTRAMUSCULAR | Status: DC | PRN
  Administered 2021-03-01: 60 mg via INTRAVENOUS

## 2021-03-01 MED ORDER — ROSUVASTATIN CALCIUM 5 MG PO TABS
5.0000 mg | ORAL_TABLET | ORAL | Status: DC
  Administered 2021-03-02: 5 mg via ORAL
  Filled 2021-03-01: qty 1

## 2021-03-01 MED ORDER — LACTATED RINGERS IV SOLN: INTRAVENOUS | Status: DC

## 2021-03-01 MED ORDER — ACETAMINOPHEN 500 MG PO TABS
1000.0000 mg | ORAL_TABLET | Freq: Once | ORAL | Status: AC | PRN
  Administered 2021-03-01: 1000 mg via ORAL

## 2021-03-01 MED ORDER — ROCURONIUM BROMIDE 10 MG/ML (PF) SYRINGE
PREFILLED_SYRINGE | INTRAVENOUS | Status: DC | PRN
  Administered 2021-03-01: 50 mg via INTRAVENOUS

## 2021-03-01 MED ORDER — PHENYLEPHRINE HCL-NACL 10-0.9 MG/250ML-% IV SOLN
INTRAVENOUS | Status: DC | PRN
  Administered 2021-03-01: 50 ug/min via INTRAVENOUS

## 2021-03-01 MED ORDER — ACETAMINOPHEN 160 MG/5ML PO SOLN
1000.0000 mg | Freq: Once | ORAL | Status: AC | PRN
  Filled 2021-03-01: qty 40

## 2021-03-01 MED ORDER — PROPOFOL 10 MG/ML IV BOLUS
INTRAVENOUS | Status: DC | PRN
  Administered 2021-03-01 (×2): 20 mg via INTRAVENOUS
  Administered 2021-03-01: 90 mg via INTRAVENOUS

## 2021-03-01 MED ORDER — AMLODIPINE BESYLATE 5 MG PO TABS
2.5000 mg | ORAL_TABLET | Freq: Every day | ORAL | Status: DC
  Filled 2021-03-01 (×2): qty 1

## 2021-03-01 MED ORDER — MORPHINE SULFATE (PF) 2 MG/ML IV SOLN
0.5000 mg | Freq: Once | INTRAVENOUS | Status: AC
  Administered 2021-03-01: 0.5 mg via INTRAVENOUS
  Filled 2021-03-01: qty 1

## 2021-03-01 MED ORDER — PINDOLOL 5 MG PO TABS
10.0000 mg | ORAL_TABLET | Freq: Two times a day (BID) | ORAL | Status: DC
  Filled 2021-03-01 (×3): qty 2

## 2021-03-01 MED ORDER — SODIUM CHLORIDE (PF) 0.9 % IJ SOLN
PREFILLED_SYRINGE | INTRAMUSCULAR | Status: DC | PRN
  Administered 2021-03-01: 4 mL
  Administered 2021-03-01: 2 mL

## 2021-03-01 MED ORDER — CHLORHEXIDINE GLUCONATE 0.12 % MT SOLN
OROMUCOSAL | Status: AC
  Administered 2021-03-01: 15 mL via OROMUCOSAL
  Filled 2021-03-01: qty 15

## 2021-03-01 MED ORDER — ACETAMINOPHEN 500 MG PO TABS
ORAL_TABLET | ORAL | Status: AC
  Administered 2021-03-02: 500 mg via ORAL
  Filled 2021-03-01: qty 2

## 2021-03-01 SURGICAL SUPPLY — 29 items
BRUSH CYTOL CELLEBRITY 1.5X140 (MISCELLANEOUS) IMPLANT
CANISTER SUCT 3000ML PPV (MISCELLANEOUS) ×3 IMPLANT
CONT SPEC 4OZ CLIKSEAL STRL BL (MISCELLANEOUS) ×3 IMPLANT
COVER BACK TABLE 60X90IN (DRAPES) ×3 IMPLANT
COVER DOME SNAP 22 D (MISCELLANEOUS) ×3 IMPLANT
FORCEPS BIOP RJ4 1.8 (CUTTING FORCEPS) IMPLANT
GAUZE SPONGE 4X4 12PLY STRL (GAUZE/BANDAGES/DRESSINGS) ×3 IMPLANT
GLOVE BIO SURGEON STRL SZ7.5 (GLOVE) ×3 IMPLANT
GOWN STRL REUS W/ TWL LRG LVL3 (GOWN DISPOSABLE) ×2 IMPLANT
GOWN STRL REUS W/TWL LRG LVL3 (GOWN DISPOSABLE) ×3
KIT CLEAN ENDO COMPLIANCE (KITS) ×6 IMPLANT
KIT TURNOVER KIT B (KITS) ×3 IMPLANT
MARKER SKIN DUAL TIP RULER LAB (MISCELLANEOUS) ×3 IMPLANT
NEEDLE EBUS SONO TIP PENTAX (NEEDLE) ×3 IMPLANT
NS IRRIG 1000ML POUR BTL (IV SOLUTION) ×3 IMPLANT
OIL SILICONE PENTAX (PARTS (SERVICE/REPAIRS)) ×3 IMPLANT
PAD ARMBOARD 7.5X6 YLW CONV (MISCELLANEOUS) ×6 IMPLANT
SOL ANTI FOG 6CC (MISCELLANEOUS) ×2 IMPLANT
SOLUTION ANTI FOG 6CC (MISCELLANEOUS) ×1
SYR 20CC LL (SYRINGE) ×6 IMPLANT
SYR 20ML ECCENTRIC (SYRINGE) ×6 IMPLANT
SYR 50ML SLIP (SYRINGE) IMPLANT
SYR 5ML LUER SLIP (SYRINGE) ×3 IMPLANT
TOWEL OR 17X24 6PK STRL BLUE (TOWEL DISPOSABLE) ×3 IMPLANT
TRAP SPECIMEN MUCOUS 40CC (MISCELLANEOUS) IMPLANT
TUBE CONNECTING 20X1/4 (TUBING) ×6 IMPLANT
UNDERPAD 30X30 (UNDERPADS AND DIAPERS) ×3 IMPLANT
VALVE DISPOSABLE (MISCELLANEOUS) ×3 IMPLANT
WATER STERILE IRR 1000ML POUR (IV SOLUTION) ×3 IMPLANT

## 2021-03-01 NOTE — Anesthesia Procedure Notes (Signed)
Procedure Name: Intubation Date/Time: 03/01/2021 1:37 PM Performed by: Valda Favia, CRNA Pre-anesthesia Checklist: Patient identified, Emergency Drugs available, Suction available and Patient being monitored Patient Re-evaluated:Patient Re-evaluated prior to induction Oxygen Delivery Method: Circle System Utilized Preoxygenation: Pre-oxygenation with 100% oxygen Induction Type: IV induction Ventilation: Mask ventilation without difficulty Laryngoscope Size: Mac and 3 Grade View: Grade I Tube type: Oral Tube size: 7.0 mm Number of attempts: 1 Airway Equipment and Method: Stylet and Oral airway Placement Confirmation: ETT inserted through vocal cords under direct vision,  positive ETCO2 and breath sounds checked- equal and bilateral Secured at: 21 cm Tube secured with: Tape Dental Injury: Teeth and Oropharynx as per pre-operative assessment

## 2021-03-01 NOTE — Op Note (Addendum)
Thoracentesis  Procedure Note  SUHAILAH KWAN  [id number]  Sep 09, 1931  Date:03/01/21  Time:5:14 PM   Provider Performing:Glorianne Proctor L Circe Chilton   Procedure: Thoracentesis with imaging guidance ([id number])  Indication(s) Pneumothorax  Consent Risks of the procedure as well as the alternatives and risks of each were explained to the patient and/or caregiver.  Consent for the procedure was obtained and is signed in the bedside chart  Anesthesia Topical only with 1% lidocaine    Time Out Verified patient identification, verified procedure, site/side was marked, verified correct patient position, special equipment/implants available, medications/allergies/relevant history reviewed, required imaging and test results available.   Sterile Technique Maximal sterile technique including full sterile barrier drape, hand hygiene, sterile gown, sterile gloves, mask, hair covering, sterile ultrasound probe cover (if used).  Procedure Description Ultrasound was used to identify appropriate pleural anatomy for placement and overlying skin marked.  Area of drainage cleaned and draped in sterile fashion. Lidocaine was used to anesthetize the skin and subcutaneous tissue.  ~400 cc's of air was drained from the right pleural space. Catheter then removed and bandaid applied to site.   Complications/Tolerance None; patient tolerated the procedure well. Chest X-ray is ordered to confirm no post-procedural complication.   EBL Minimal   Specimen(s) None    Garner Nash, DO [place] [facility] [facility] 03/01/2021 5:15 PM      [place] Chest: Visible pleural line, sliding lung s/p pneumothorax drainage

## 2021-03-01 NOTE — Interval H&P Note (Signed)
History and Physical Interval Note:  03/01/2021 12:20 PM  Kim Ward  has presented today for surgery, with the diagnosis of lung mass, adenopathy.  The various methods of treatment have been discussed with the patient and family. After consideration of risks, benefits and other options for treatment, the patient has consented to  Procedure(s): [name] (N/A) as a surgical intervention.  The patient's history has been reviewed, patient examined, no change in status, stable for surgery.  I have reviewed the patient's chart and labs.  Questions were answered to the patient's satisfaction.     [place]

## 2021-03-01 NOTE — Op Note (Signed)
Video Bronchoscopy with Endobronchial Ultrasound Procedure Note  Date of Operation: 03/01/2021  Pre-op Diagnosis: RUL mass, adenopathy   Post-op Diagnosis: RUL mass, adenopathy   Surgeon: Garner Nash, DO   Assistants: None   Anesthesia: General endotracheal anesthesia  Operation: Flexible video fiberoptic bronchoscopy with endobronchial ultrasound and biopsies.  Estimated Blood Loss: Minimal  Complications: Bleeding, Right upper lobe   Indications and History: Kim Ward is a 87 y.o. female with right upper lobe mass, mediastinal adenopathy.  The risks, benefits, complications, treatment options and expected outcomes were discussed with the patient.  The possibilities of pneumothorax, pneumonia, reaction to medication, pulmonary aspiration, perforation of a viscus, bleeding, failure to diagnose a condition and creating a complication requiring transfusion or operation were discussed with the patient who freely signed the consent.    Description of Procedure: The patient was examined in the preoperative area and history and data from the preprocedure consultation were reviewed. It was deemed appropriate to proceed.  The patient was taken to [facility] endoscopy room 2, identified as Kim Ward and the procedure verified as Flexible Video Fiberoptic Bronchoscopy.  A Time Out was held and the above information confirmed. After being taken to the operating room general anesthesia was initiated and the patient  was orally intubated. The video fiberoptic bronchoscope was introduced via the endotracheal tube and a general inspection was performed which showed normal right and left lung anatomy, the right upper lobe apical segment and posterior segment was fully occluded with visible tumor, likely extrinsic compression. The standard scope was then withdrawn and the endobronchial ultrasound was used to identify and characterize the peritracheal, hilar and bronchial lymph nodes. Inspection showed no  enlarged subcarinal nodes, the 10 R node was enlarged and the right upper lobe mass was visible through the lateral portion of the tracheal wall. Using real-time ultrasound guidance Wang needle biopsies were take from Station 10R nodes as well as the right upper lobe mass and were sent for cytology. The patient tolerated the procedure well without apparent complications.    Following the endobronchial ultrasound procedure component we used cytology brushes as well as [employer] forceps for right upper lobe transbronchial biopsies into the apical and posterior segment which was identified to contain mass. Following sampling there was some bleeding from the apical segment which required ice saline to help with hemostasis. Additionally 8 cc of diluted 1:10,000 epinephrine solution was used to aid in hemostasis.  The standard therapeutic bronchoscope was used for aspiration of the bilateral mainstem's and removal of any remaining blood clots and debris. There was no significant blood loss. The bronchoscope was withdrawn. Anesthesia was reversed and the patient was taken to the PACU for recovery.   Samples: 1. Wang needle biopsies from 10R node 2. Wang needle biopsies from right upper lobe mass 3. Endobronchial biopsies and transbronchial biopsies to the RUL 4. Cytology brushings RUL  5. RUL bronchial washings   Plans:  The patient will be discharged from the PACU to home when recovered from anesthesia. We will review the cytology, pathology and microbiology results with the patient when they become available. Outpatient followup will be with [name]. DO.   Garner Nash, DO [place] [facility] 03/01/2021 3:24 PM

## 2021-03-01 NOTE — Progress Notes (Addendum)
PCCM interval progress note:  Pt's pneumothorax improved post-thoracentesis, however called to the bedside as patient was eating beef for dinner and felt like a piece got stuck in her esophagus.   On evaluation was sitting up in bed in no respiratory distress with O2 sats 99% on RA.   However, she was not able to swallow her own secretions and felt uncomfortable from dysphagia.  She denies a history of GERD or stricture, says that she has never experienced a food impaction before.  -as she is stable, worth trying Glucagon, though will likely need GI consult for overnight possible endoscopy.  -spoke with Dr. Fuller Plan, plan to keep patient NPO,sit as upright as possible, try one more dose Glucagon and likely endoscopy in early AM.  She remains stable and with good oxygen saturations on room air.  -RN to contact critical care for any signs of decompensation overnight  Otilio Carpen Kailea Dannemiller, PA-C [facility] See Amion for pager If no response to pager , please call [phone] until 7pm After 7:00 pm call Elink  [phone]?[place]

## 2021-03-01 NOTE — Discharge Instructions (Signed)
Flexible Bronchoscopy, Care After This sheet gives you information about how to care for yourself after your test. Your doctor may also give you more specific instructions. If you have problems or questions, contact your doctor. Follow these instructions at home: Eating and drinking  The day after the test, go back to your normal diet. Driving  Do not drive for 24 hours if you were given a medicine to help you relax (sedative).  Do not drive or use heavy machinery while taking prescription pain medicine. General instructions   Take over-the-counter and prescription medicines only as told by your doctor.  Return to your normal activities as told. Ask what activities are safe for you.  Do not use any products that have nicotine or tobacco in them. This includes cigarettes and e-cigarettes. If you need help quitting, ask your doctor.  Keep all follow-up visits as told by your doctor. This is important. It is very important if you had a tissue sample (biopsy) taken. Get help right away if:  You have shortness of breath that gets worse.  You get light-headed.  You feel like you are going to pass out (faint).  You have chest pain.  You cough up: ? More than a little blood. ? More blood than before. Summary  Do not eat or drink anything (not even water) for 2 hours after your test, or until your numbing medicine wears off.  Do not use cigarettes. Do not use e-cigarettes.  Get help right away if you have chest pain.   This information is not intended to replace advice given to you by your health care provider. Make sure you discuss any questions you have with your health care provider. Document Released: 09/01/2009 Document Revised: 10/17/2017 Document Reviewed: 11/22/2016 Elsevier Patient Education  2020 [employer].

## 2021-03-01 NOTE — Plan of Care (Signed)
  Problem: Clinical Measurements: Goal: Postoperative complications will be avoided or minimized Outcome: Progressing   Problem: Skin Integrity: Goal: Demonstration of wound healing without infection will improve Outcome: Progressing   

## 2021-03-01 NOTE — Anesthesia Procedure Notes (Signed)
Arterial Line Insertion Start/End4/14/2022 12:45 PM, 03/01/2021 1:05 PM Performed by: Leonor Liv, CRNA, CRNA  Patient location: Pre-op. Preanesthetic checklist: patient identified, IV checked, site marked, risks and benefits discussed, surgical consent, monitors and equipment checked, pre-op evaluation, timeout performed and anesthesia consent Lidocaine 1% used for infiltration Right, radial was placed Catheter size: 20 G Hand hygiene performed  and maximum sterile barriers used  Allen's test indicative of satisfactory collateral circulation Attempts: 2 Procedure performed without using ultrasound guided technique. Following insertion, Biopatch and dressing applied. Post procedure assessment: normal  Patient tolerated the procedure well with no immediate complications. Additional procedure comments: Small bruise to site from initial puncture .

## 2021-03-01 NOTE — Progress Notes (Signed)
[place] Progress Note Patient Name: Kim Ward DOB: December 02, 1930 MRN: [id number]   Date of Service  03/01/2021  HPI/Events of Note  Patient was chewing a piece of beef when she choked on it.She feels it's still suck in her airway and she's been coughing and drooling. There's no camera in her room.  eICU Interventions  PCCM ground crew requested to go and evaluate the patient.        Kerry Kass Kodee Ravert 03/01/2021, 9:17 PM

## 2021-03-01 NOTE — Consult Note (Signed)
Synopsis: Referred in April 2022 for lung mass by No ref. provider found  Subjective:   PATIENT ID: Kim Ward GENDER: female DOB: [birth year], MRN: [id number]  Chief complaint: Abnormal chest imaging  This is an 87 year old female, past medical history of hypertension, hyperlipidemia, severe aortic stenosis.  Had previously followed closely with cardiology and had evaluation by Dr. Burt Knack for potential TAVR.  During this time patient ultimately underwent CT scan of the chest for evaluation in March 2022.  This showed a large right upper lobe lung mass measuring 4.7 x 3.7 x 4.5 cm with multiple prominent borderline enlarged mediastinal and hilar nodes.  The decision was made for referral for biopsy.  Patient underwent PET scan on 02/20/2021.  It was hypermetabolic with an SUV elevation.  Additionally there was a small focus in the left breast with an SUV of 3.5.  She is also scheduled for a breast biopsy.  Patient presents today for evaluation perioperatively and discussed the risk benefits and alternatives of videobronchoscope with endobronchial ultrasound transbronchial needle aspirations.  Patient is a widow with 3 children.  I also discussed this with the patient's daughter.  Patient is a former smoker for approximately 10 years and he she quit 50 years ago.  Today we discussed the risk benefits alternatives of proceeding with bronchoscopy the endobronchial ultrasound transbronchial needle aspiration.  Patient also understands risk of undergoing anesthesia.    Past Medical History:  Diagnosis Date  . Adenomatous colon polyp   . Depression    Tx'd 19 yrs ago when husband passed away  . Diverticulosis   . Hyperlipidemia   . Hypertension   . Hypothyroidism    Dr. Dwyane Dee follows pt.--had small nodules but had decreased in size and stopped Synthroid 06/2013  . Osteoporosis   . Pure hypercholesterolemia   . Severe aortic stenosis   . SVT (supraventricular tachycardia) (HCC)      Family  History  Problem Relation Age of Onset  . Stroke Sister   . Breast cancer Sister   . Breast cancer Daughter 60       metastatic breast ca     Past Surgical History:  Procedure Laterality Date  . CERVICAL BIOPSY  W/ LOOP ELECTRODE EXCISION  1996   CIN I  . COLONOSCOPY  03/2001, 01/2007   2008 - no polyps  . DILATION AND CURETTAGE OF UTERUS    . LEFT HEART CATH AND CORONARY ANGIOGRAPHY N/A 01/12/2021   Procedure: LEFT HEART CATH AND CORONARY ANGIOGRAPHY;  Surgeon: Sherren Mocha, MD;  Location: [facility];  Service: Cardiovascular;  Laterality: N/A;  . TONSILLECTOMY      Social History   Socioeconomic History  . Marital status: Widowed    Spouse name: Not on file  . Number of children: 2  . Years of education: Not on file  . Highest education level: Not on file  Occupational History  . Occupation: Retired  Tobacco Use  . Smoking status: Former Smoker    Quit date: 06/17/1972    Years since quitting: 48.7  . Smokeless tobacco: Never Used  . Tobacco comment: quit June 17, 1972  Vaping Use  . Vaping Use: Never used  Substance and Sexual Activity  . Alcohol use: Yes    Alcohol/week: 0.0 standard drinks    Comment: 2 drinks per month  . Drug use: No  . Sexual activity: Never    Partners: Male    Birth control/protection: Post-menopausal  Other Topics Concern  . Not  on file  Social History Narrative  . Not on file   Social Determinants of Health   Financial Resource Strain: Not on file  Food Insecurity: Not on file  Transportation Needs: Not on file  Physical Activity: Not on file  Stress: Not on file  Social Connections: Not on file  Intimate Partner Violence: Not on file     Allergies  Allergen Reactions  . Augmentin [Amoxicillin-Pot Clavulanate]     Unknown   . Azithromycin     Or any drug that ends in "mycin"  . Bextra [Valdecoxib]     Unknown  . Cefuroxime Other (See Comments)    dizziness  . Cefuroxime Axetil     Severe dizziness  .  Etodolac Other (See Comments)    Reaction unknown  . Humibid La [Guaifenesin] Other (See Comments)    Mouth sores  . Lipitor [Atorvastatin Calcium]     MYALGIAS  . Ramipril Cough  . Risedronate Sodium      GI ISSUES  . Tramadol Other (See Comments)    Vomiting and diarrhea  . Zocor [Simvastatin]     MYALGIAS  . Niaspan [Niacin Er] Rash     @ENCMEDSTART @  Review of Systems  Constitutional: Negative for chills, fever, malaise/fatigue and weight loss.  HENT: Negative for hearing loss, sore throat and tinnitus.   Eyes: Negative for blurred vision and double vision.  Respiratory: Negative for cough, hemoptysis, sputum production, shortness of breath, wheezing and stridor.   Cardiovascular: Negative for chest pain, palpitations, orthopnea, leg swelling and PND.  Gastrointestinal: Negative for abdominal pain, constipation, diarrhea, heartburn, nausea and vomiting.  Genitourinary: Negative for dysuria, hematuria and urgency.  Musculoskeletal: Negative for joint pain and myalgias.  Skin: Negative for itching and rash.  Neurological: Negative for dizziness, tingling, weakness and headaches.  Endo/Heme/Allergies: Negative for environmental allergies. Does not bruise/bleed easily.  Psychiatric/Behavioral: Negative for depression. The patient is not nervous/anxious and does not have insomnia.   All other systems reviewed and are negative.    Objective:  Physical Exam Vitals reviewed.  Constitutional:      General: She is not in acute distress.    Appearance: She is well-developed.  HENT:     Head: Normocephalic and atraumatic.  Eyes:     General: No scleral icterus.    Conjunctiva/sclera: Conjunctivae normal.     Pupils: Pupils are equal, round, and reactive to light.  Neck:     Vascular: No JVD.     Trachea: No tracheal deviation.  Cardiovascular:     Rate and Rhythm: Normal rate and regular rhythm.     Heart sounds: Murmur heard.    Pulmonary:     Effort: Pulmonary effort  is normal. No tachypnea, accessory muscle usage or respiratory distress.     Breath sounds: No stridor. No wheezing, rhonchi or rales.  Abdominal:     General: Bowel sounds are normal. There is no distension.     Palpations: Abdomen is soft.     Tenderness: There is no abdominal tenderness.  Musculoskeletal:        General: No tenderness.     Cervical back: Neck supple.  Lymphadenopathy:     Cervical: No cervical adenopathy.  Skin:    General: Skin is warm and dry.     Capillary Refill: Capillary refill takes less than 2 seconds.     Findings: No rash.  Neurological:     Mental Status: She is alert and oriented to person, place, and time.  Psychiatric:        Behavior: Behavior normal.      Vitals:   02/28/21 1648 03/01/21 1039  BP:  (!) 154/39  Pulse:  60  Resp:  18  Temp:  98.4 F (36.9 C)  TempSrc:  Oral  SpO2:  98%  Weight: 77.1 kg 78.9 kg  Height: 5\' 6"  (1.676 m) 5\' 6"  (1.676 m)   98% on RA BMI Readings from Last 3 Encounters:  03/01/21 28.08 kg/m  02/22/21 24.34 kg/m  01/22/21 24.59 kg/m   Wt Readings from Last 3 Encounters:  03/01/21 78.9 kg  02/22/21 70.5 kg  01/22/21 71.2 kg     CBC    Component Value Date/Time   WBC 7.0 02/22/2021 1433   WBC 7.7 10/30/2020 0712   RBC 4.12 02/22/2021 1433   HGB 11.8 (L) 02/22/2021 1433   HGB 13.2 12/27/2020 1137   HCT 36.7 02/22/2021 1433   HCT 40.5 12/27/2020 1137   PLT 256 02/22/2021 1433   PLT 312 12/27/2020 1137   MCV 89.1 02/22/2021 1433   MCV 88 12/27/2020 1137   MCH 28.6 02/22/2021 1433   MCHC 32.2 02/22/2021 1433   RDW 14.7 02/22/2021 1433   RDW 14.3 12/27/2020 1137   LYMPHSABS 1.8 02/22/2021 1433   MONOABS 0.7 02/22/2021 1433   EOSABS 0.1 02/22/2021 1433   BASOSABS 0.1 02/22/2021 1433    Chest Imaging: 01/22/2021 CT chest: Large right upper lobe mass 4.7 x 3.7 x 4.5 cm with enlarged mediastinal and hilar adenopathy concerning for primary bronchogenic carcinoma. The patient's images have  been independently reviewed by me.    02/20/2021: PET imaging with hypermetabolic lesions as described above in CT scan. The patient's images have been independently reviewed by me.    Pulmonary Functions Testing Results: No flowsheet data found.  FeNO:   Pathology:   Echocardiogram:   Heart Catheterization:     Assessment & Plan:   Right upper lobe mass Mediastinal and hilar adenopathy Concerning for a primary bronchogenic carcinoma, advanced stage Aortic stenosis  Discussion: Today we discussed the risk benefits and alternatives of proceeding with video bronchoscopy with endobronchial ultrasound transbronchial needle aspiration. Patient understands the risk of bleeding as well as pneumothorax. We also discussed the risk of anesthesia with her known aortic stenosis   Garner Nash, DO [place] [facility] 03/01/2021 12:03 PM

## 2021-03-01 NOTE — Transfer of Care (Signed)
Immediate Anesthesia Transfer of Care Note  Patient: Kim Ward  Procedure(s) Performed: VIDEO BRONCHOSCOPY WITH ENDOBRONCHIAL ULTRASOUND (N/A ) BRONCHIAL NEEDLE ASPIRATION BIOPSIES BRONCHIAL BRUSHINGS BRONCHIAL BIOPSIES BRONCHIAL WASHINGS  Patient Location: PACU  Anesthesia Type:General  Level of Consciousness: drowsy and patient cooperative  Airway & Oxygen Therapy: Patient Spontanous Breathing and Patient connected to face mask oxygen  Post-op Assessment: Report given to RN and Post -op Vital signs reviewed and stable  Post vital signs: Reviewed and stable  Last Vitals:  Vitals Value Taken Time  BP 146/41   Temp    Pulse 60   Resp 16   SpO2 97     Last Pain:  Vitals:   03/01/21 1054  TempSrc:   PainSc: 0-No pain         Complications: No complications documented.

## 2021-03-01 NOTE — Progress Notes (Addendum)
Pt called RN into room, pt states "I have a beef piece stuck in my throat" Pt. On Room air, O2 Sats 97%, HR 60. Pt actively coughing and drooling large amounts of sputum. Sputum clear. Continue to monitor.  2030 Pt continue to to cough and drool, now blood tinged. PCCM made aware, continue to monitor.

## 2021-03-01 NOTE — H&P (View-Only) (Signed)
Synopsis: Referred in April 2022 for lung mass by No ref. provider found  Subjective:   PATIENT ID: Kim Ward GENDER: female DOB: [birth year], MRN: [id number]  Chief complaint: Abnormal chest imaging  This is an 87 year old female, past medical history of hypertension, hyperlipidemia, severe aortic stenosis.  Had previously followed closely with cardiology and had evaluation by Dr. Burt Knack for potential TAVR.  During this time patient ultimately underwent CT scan of the chest for evaluation in March 2022.  This showed a large right upper lobe lung mass measuring 4.7 x 3.7 x 4.5 cm with multiple prominent borderline enlarged mediastinal and hilar nodes.  The decision was made for referral for biopsy.  Patient underwent PET scan on 02/20/2021.  It was hypermetabolic with an SUV elevation.  Additionally there was a small focus in the left breast with an SUV of 3.5.  She is also scheduled for a breast biopsy.  Patient presents today for evaluation perioperatively and discussed the risk benefits and alternatives of videobronchoscope with endobronchial ultrasound transbronchial needle aspirations.  Patient is a widow with 3 children.  I also discussed this with the patient's daughter.  Patient is a former smoker for approximately 10 years and he she quit 50 years ago.  Today we discussed the risk benefits alternatives of proceeding with bronchoscopy the endobronchial ultrasound transbronchial needle aspiration.  Patient also understands risk of undergoing anesthesia.    Past Medical History:  Diagnosis Date  . Adenomatous colon polyp   . Depression    Tx'd 19 yrs ago when husband passed away  . Diverticulosis   . Hyperlipidemia   . Hypertension   . Hypothyroidism    Dr. Dwyane Dee follows pt.--had small nodules but had decreased in size and stopped Synthroid 06/2013  . Osteoporosis   . Pure hypercholesterolemia   . Severe aortic stenosis   . SVT (supraventricular tachycardia) (HCC)      Family  History  Problem Relation Age of Onset  . Stroke Sister   . Breast cancer Sister   . Breast cancer Daughter 29       metastatic breast ca     Past Surgical History:  Procedure Laterality Date  . CERVICAL BIOPSY  W/ LOOP ELECTRODE EXCISION  1996   CIN I  . COLONOSCOPY  03/2001, 01/2007   2008 - no polyps  . DILATION AND CURETTAGE OF UTERUS    . LEFT HEART CATH AND CORONARY ANGIOGRAPHY N/A 01/12/2021   Procedure: LEFT HEART CATH AND CORONARY ANGIOGRAPHY;  Surgeon: Sherren Mocha, MD;  Location: [facility];  Service: Cardiovascular;  Laterality: N/A;  . TONSILLECTOMY      Social History   Socioeconomic History  . Marital status: Widowed    Spouse name: Not on file  . Number of children: 2  . Years of education: Not on file  . Highest education level: Not on file  Occupational History  . Occupation: Retired  Tobacco Use  . Smoking status: Former Smoker    Quit date: 06/17/1972    Years since quitting: 48.7  . Smokeless tobacco: Never Used  . Tobacco comment: quit June 17, 1972  Vaping Use  . Vaping Use: Never used  Substance and Sexual Activity  . Alcohol use: Yes    Alcohol/week: 0.0 standard drinks    Comment: 2 drinks per month  . Drug use: No  . Sexual activity: Never    Partners: Male    Birth control/protection: Post-menopausal  Other Topics Concern  . Not  on file  Social History Narrative  . Not on file   Social Determinants of Health   Financial Resource Strain: Not on file  Food Insecurity: Not on file  Transportation Needs: Not on file  Physical Activity: Not on file  Stress: Not on file  Social Connections: Not on file  Intimate Partner Violence: Not on file     Allergies  Allergen Reactions  . Augmentin [Amoxicillin-Pot Clavulanate]     Unknown   . Azithromycin     Or any drug that ends in "mycin"  . Bextra [Valdecoxib]     Unknown  . Cefuroxime Other (See Comments)    dizziness  . Cefuroxime Axetil     Severe dizziness  .  Etodolac Other (See Comments)    Reaction unknown  . Humibid La [Guaifenesin] Other (See Comments)    Mouth sores  . Lipitor [Atorvastatin Calcium]     MYALGIAS  . Ramipril Cough  . Risedronate Sodium      GI ISSUES  . Tramadol Other (See Comments)    Vomiting and diarrhea  . Zocor [Simvastatin]     MYALGIAS  . Niaspan [Niacin Er] Rash     @ENCMEDSTART @  Review of Systems  Constitutional: Negative for chills, fever, malaise/fatigue and weight loss.  HENT: Negative for hearing loss, sore throat and tinnitus.   Eyes: Negative for blurred vision and double vision.  Respiratory: Negative for cough, hemoptysis, sputum production, shortness of breath, wheezing and stridor.   Cardiovascular: Negative for chest pain, palpitations, orthopnea, leg swelling and PND.  Gastrointestinal: Negative for abdominal pain, constipation, diarrhea, heartburn, nausea and vomiting.  Genitourinary: Negative for dysuria, hematuria and urgency.  Musculoskeletal: Negative for joint pain and myalgias.  Skin: Negative for itching and rash.  Neurological: Negative for dizziness, tingling, weakness and headaches.  Endo/Heme/Allergies: Negative for environmental allergies. Does not bruise/bleed easily.  Psychiatric/Behavioral: Negative for depression. The patient is not nervous/anxious and does not have insomnia.   All other systems reviewed and are negative.    Objective:  Physical Exam Vitals reviewed.  Constitutional:      General: She is not in acute distress.    Appearance: She is well-developed.  HENT:     Head: Normocephalic and atraumatic.  Eyes:     General: No scleral icterus.    Conjunctiva/sclera: Conjunctivae normal.     Pupils: Pupils are equal, round, and reactive to light.  Neck:     Vascular: No JVD.     Trachea: No tracheal deviation.  Cardiovascular:     Rate and Rhythm: Normal rate and regular rhythm.     Heart sounds: Murmur heard.    Pulmonary:     Effort: Pulmonary effort  is normal. No tachypnea, accessory muscle usage or respiratory distress.     Breath sounds: No stridor. No wheezing, rhonchi or rales.  Abdominal:     General: Bowel sounds are normal. There is no distension.     Palpations: Abdomen is soft.     Tenderness: There is no abdominal tenderness.  Musculoskeletal:        General: No tenderness.     Cervical back: Neck supple.  Lymphadenopathy:     Cervical: No cervical adenopathy.  Skin:    General: Skin is warm and dry.     Capillary Refill: Capillary refill takes less than 2 seconds.     Findings: No rash.  Neurological:     Mental Status: She is alert and oriented to person, place, and time.  Psychiatric:        Behavior: Behavior normal.      Vitals:   02/28/21 1648 03/01/21 1039  BP:  (!) 154/39  Pulse:  60  Resp:  18  Temp:  98.4 F (36.9 C)  TempSrc:  Oral  SpO2:  98%  Weight: 77.1 kg 78.9 kg  Height: 5\' 6"  (1.676 m) 5\' 6"  (1.676 m)   98% on RA BMI Readings from Last 3 Encounters:  03/01/21 28.08 kg/m  02/22/21 24.34 kg/m  01/22/21 24.59 kg/m   Wt Readings from Last 3 Encounters:  03/01/21 78.9 kg  02/22/21 70.5 kg  01/22/21 71.2 kg     CBC    Component Value Date/Time   WBC 7.0 02/22/2021 1433   WBC 7.7 10/30/2020 0712   RBC 4.12 02/22/2021 1433   HGB 11.8 (L) 02/22/2021 1433   HGB 13.2 12/27/2020 1137   HCT 36.7 02/22/2021 1433   HCT 40.5 12/27/2020 1137   PLT 256 02/22/2021 1433   PLT 312 12/27/2020 1137   MCV 89.1 02/22/2021 1433   MCV 88 12/27/2020 1137   MCH 28.6 02/22/2021 1433   MCHC 32.2 02/22/2021 1433   RDW 14.7 02/22/2021 1433   RDW 14.3 12/27/2020 1137   LYMPHSABS 1.8 02/22/2021 1433   MONOABS 0.7 02/22/2021 1433   EOSABS 0.1 02/22/2021 1433   BASOSABS 0.1 02/22/2021 1433    Chest Imaging: 01/22/2021 CT chest: Large right upper lobe mass 4.7 x 3.7 x 4.5 cm with enlarged mediastinal and hilar adenopathy concerning for primary bronchogenic carcinoma. The patient's images have  been independently reviewed by me.    02/20/2021: PET imaging with hypermetabolic lesions as described above in CT scan. The patient's images have been independently reviewed by me.    Pulmonary Functions Testing Results: No flowsheet data found.  FeNO:   Pathology:   Echocardiogram:   Heart Catheterization:     Assessment & Plan:   Right upper lobe mass Mediastinal and hilar adenopathy Concerning for a primary bronchogenic carcinoma, advanced stage Aortic stenosis  Discussion: Today we discussed the risk benefits and alternatives of proceeding with video bronchoscopy with endobronchial ultrasound transbronchial needle aspiration. Patient understands the risk of bleeding as well as pneumothorax. We also discussed the risk of anesthesia with her known aortic stenosis   Garner Nash, DO [facility] 03/01/2021 12:03 PM

## 2021-03-02 ENCOUNTER — Encounter (HOSPITAL_COMMUNITY): Payer: Self-pay | Admitting: Pulmonary Disease

## 2021-03-02 ENCOUNTER — Other Ambulatory Visit: Payer: Medicare PPO

## 2021-03-02 ENCOUNTER — Encounter (HOSPITAL_COMMUNITY)
Admission: RE | Disposition: A | Discharge status: Home / Self Care | Payer: Self-pay | Source: Home / Self Care | Attending: Pulmonary Disease

## 2021-03-02 ENCOUNTER — Observation Stay (HOSPITAL_COMMUNITY): Payer: Medicare PPO

## 2021-03-02 ENCOUNTER — Observation Stay (HOSPITAL_COMMUNITY): Payer: Medicare PPO | Admitting: Certified Registered Nurse Anesthetist

## 2021-03-02 ENCOUNTER — Telehealth: Payer: Self-pay

## 2021-03-02 DIAGNOSIS — T18128A Food in esophagus causing other injury, initial encounter: Secondary | ICD-10-CM

## 2021-03-02 DIAGNOSIS — W44F3XA Food entering into or through a natural orifice, initial encounter: Secondary | ICD-10-CM

## 2021-03-02 DIAGNOSIS — J95811 Postprocedural pneumothorax: Secondary | ICD-10-CM | POA: Diagnosis not present

## 2021-03-02 DIAGNOSIS — K222 Esophageal obstruction: Secondary | ICD-10-CM | POA: Diagnosis not present

## 2021-03-02 DIAGNOSIS — R918 Other nonspecific abnormal finding of lung field: Secondary | ICD-10-CM | POA: Diagnosis not present

## 2021-03-02 DIAGNOSIS — K317 Polyp of stomach and duodenum: Secondary | ICD-10-CM | POA: Diagnosis not present

## 2021-03-02 HISTORY — PX: ESOPHAGOGASTRODUODENOSCOPY (EGD) WITH PROPOFOL: SHX5813

## 2021-03-02 HISTORY — PX: FOREIGN BODY REMOVAL: SHX962

## 2021-03-02 SURGERY — ESOPHAGOGASTRODUODENOSCOPY (EGD) WITH PROPOFOL
Anesthesia: General

## 2021-03-02 MED ORDER — PINDOLOL 5 MG PO TABS
10.0000 mg | ORAL_TABLET | Freq: Two times a day (BID) | ORAL | Status: DC
  Administered 2021-03-02 – 2021-03-03 (×2): 10 mg via ORAL
  Filled 2021-03-02 (×3): qty 2

## 2021-03-02 MED ORDER — METOPROLOL TARTRATE 5 MG/5ML IV SOLN: 2.5000 mg | INTRAVENOUS | Status: DC | PRN

## 2021-03-02 MED ORDER — LACTATED RINGERS IV SOLN: INTRAVENOUS | Status: DC | PRN

## 2021-03-02 MED ORDER — PROPOFOL 10 MG/ML IV BOLUS
INTRAVENOUS | Status: DC | PRN
  Administered 2021-03-02: 50 mg via INTRAVENOUS
  Administered 2021-03-02: 30 mg via INTRAVENOUS

## 2021-03-02 MED ORDER — LIDOCAINE 2% (20 MG/ML) 5 ML SYRINGE
INTRAMUSCULAR | Status: DC | PRN
  Administered 2021-03-02: 60 mg via INTRAVENOUS

## 2021-03-02 MED ORDER — ACETAMINOPHEN 500 MG PO TABS
500.0000 mg | ORAL_TABLET | Freq: Four times a day (QID) | ORAL | Status: DC | PRN
  Filled 2021-03-02: qty 1

## 2021-03-02 MED ORDER — AMIODARONE HCL 200 MG PO TABS
200.0000 mg | ORAL_TABLET | Freq: Every day | ORAL | Status: DC
  Administered 2021-03-02 – 2021-03-03 (×2): 200 mg via ORAL
  Filled 2021-03-02 (×2): qty 1

## 2021-03-02 MED ORDER — ONDANSETRON HCL 4 MG/2ML IJ SOLN
INTRAMUSCULAR | Status: DC | PRN
  Administered 2021-03-02: 4 mg via INTRAVENOUS

## 2021-03-02 MED ORDER — LACTATED RINGERS IV SOLN: INTRAVENOUS | Status: DC

## 2021-03-02 MED ORDER — SUCCINYLCHOLINE CHLORIDE 200 MG/10ML IV SOSY
PREFILLED_SYRINGE | INTRAVENOUS | Status: DC | PRN
  Administered 2021-03-02: 100 mg via INTRAVENOUS

## 2021-03-02 MED ORDER — MORPHINE SULFATE (PF) 2 MG/ML IV SOLN
0.5000 mg | Freq: Once | INTRAVENOUS | Status: AC
  Administered 2021-03-02: 0.5 mg via INTRAVENOUS
  Filled 2021-03-02: qty 1

## 2021-03-02 MED ORDER — PHENYLEPHRINE 40 MCG/ML (10ML) SYRINGE FOR IV PUSH (FOR BLOOD PRESSURE SUPPORT)
PREFILLED_SYRINGE | INTRAVENOUS | Status: DC | PRN
  Administered 2021-03-02: 40 ug via INTRAVENOUS
  Administered 2021-03-02: 80 ug via INTRAVENOUS
  Administered 2021-03-02: 40 ug via INTRAVENOUS
  Administered 2021-03-02: 120 ug via INTRAVENOUS
  Administered 2021-03-02: 80 ug via INTRAVENOUS

## 2021-03-02 SURGICAL SUPPLY — 14 items

## 2021-03-02 NOTE — Anesthesia Preprocedure Evaluation (Addendum)
Anesthesia Evaluation  Patient identified by MRN, date of birth, ID band Patient awake    Reviewed: Allergy & Precautions, NPO status , Patient's Chart, lab work & pertinent test results, reviewed documented beta blocker date and time   History of Anesthesia Complications Negative for: history of anesthetic complications  Airway Mallampati: I  TM Distance: >3 FB Neck ROM: Full    Dental  (+) Edentulous Upper, Edentulous Lower, Dental Advisory Given   Pulmonary neg pulmonary ROS, former smoker,  Covid-19 Nucleic Acid Test Results Lab Results      Component                Value               Date                      SARSCOV2NAA              NEGATIVE            02/26/2021                [place]              NEGATIVE            01/09/2021                [place]              NEGATIVE            10/28/2020                [place]              NEGATIVE            10/26/2020              breath sounds clear to auscultation       Cardiovascular hypertension, Pt. on medications and Pt. on home beta blockers + dysrhythmias Supra Ventricular Tachycardia + Valvular Problems/Murmurs AS  Rhythm:Regular + Systolic murmurs 0240 echo:  1. Left ventricular ejection fraction, by estimation, is 60 to 65%. The  left ventricle has normal function. The left ventricle has no regional  wall motion abnormalities. There is mild left ventricular hypertrophy.  Left ventricular diastolic parameters  are consistent with Grade II diastolic dysfunction (pseudonormalization).  2. Right ventricular systolic function is normal. The right ventricular  size is normal.  3. The mitral valve is normal in structure. Mild mitral valve  regurgitation. No evidence of mitral stenosis.  4. Gradient is less this time as compare to echocardiogram from  12/30/2019. Most likely missed higher velocity jet of AS at this study. All  other parameters indicate severe  AS. The aortic valve is normal in  structure. Aortic valve regurgitation is mild.  Severe aortic valve stenosis. Aortic valve mean gradient measures 30.7  mmHg.  5. The inferior vena cava is normal in size with greater than 50%  respiratory variability, suggesting right atrial pressure of 3 mmHg.    Neuro/Psych neg Seizures PSYCHIATRIC DISORDERS Depression negative neurological ROS     GI/Hepatic negative GI ROS, Neg liver ROS,   Endo/Other  Hypothyroidism No results found for: HGBA1C   Renal/GU negative Renal ROSLab Results      Component                Value               Date  CREATININE               0.93                02/22/2021           Lab Results      Component                Value               Date                      K                        4.4                 02/22/2021                Musculoskeletal negative musculoskeletal ROS (+)   Abdominal   Peds  Hematology  (+) Blood dyscrasia, anemia , Lab Results      Component                Value               Date                      WBC                      7.0                 02/22/2021                HGB                      11.8 (L)            02/22/2021                HCT                      36.7                02/22/2021                MCV                      89.1                02/22/2021                PLT                      256                 02/22/2021              Anesthesia Other Findings Conclusion  1. Angiographically normal coronary arteries (right dominant) 2. Calcified aortic valve with probable severe aortic stenosis (mean pressure gradient by cath is 35 mmHg)  Pt with severe, symptomatic aortic stenosis. Continue multidisciplinary evaluation for TAVR   Reproductive/Obstetrics                             Anesthesia Physical  Anesthesia Plan  ASA: IV and emergent  Anesthesia Plan: General   Post-op Pain Management:  Induction: Intravenous, Cricoid pressure planned and Rapid sequence  PONV Risk Score and Plan: 3 and Ondansetron, Dexamethasone and Propofol infusion  Airway Management Planned: Oral ETT  Additional Equipment: Arterial line  Intra-op Plan:   Post-operative Plan: Extubation in OR  Informed Consent: I have reviewed the patients History and Physical, chart, labs and discussed the procedure including the risks, benefits and alternatives for the proposed anesthesia with the patient or authorized representative who has indicated his/her understanding and acceptance.     Dental advisory given  Plan Discussed with: CRNA  Anesthesia Plan Comments: (PAT note by Karoline Caldwell, PA-C: Pt recently undergoing eval by Dr. Burt Knack for severe aortic stenosis. Preop CT showed large right upper lobe lung mass measuring 4.7 x 3.7 x 4.5 cm with multiple prominent borderline enlarged mediastinal and hilar lymph nodes as well as indeterminate left adrenal nodule concerning for potential metastatic disease.  TAVR workup was put on hold so this could be further evaluated. PET scan 02/20/2021 showed hypermetabolic right upper lobe mass compatible with malignancy with hypermetabolic right hilar adenopathy and hypermetabolic bilateral adrenal lesions suspicious for metastatic disease.  There was trace right pleural effusion indeterminate for malignant involvement.  There was a small focus of accentuated metabolic activity in the left breast at the 12 o'clock position with maximum SUV of 3.5. Pt was referred to oncology and subsequently to pulmonology with recommendation to undergo EBUS bronchoscopy for tissue diagnosis.   CMP and CBC from 02/22/2021 reviewed, unremarkable.  Patient will need day of surgery evaluation.  Cath 01/12/21: 1. Angiographically normal coronary arteries (right dominant) 2. Calcified aortic valve with probable severe aortic stenosis (mean pressure gradient by cath is 35 mmHg)  Pt with severe,  symptomatic aortic stenosis. Continue multidisciplinary evaluation for TAVR  TTE 08/25/20: 1. Left ventricular ejection fraction, by estimation, is 60 to 65%. The  left ventricle has normal function. The left ventricle has no regional  wall motion abnormalities. There is mild left ventricular hypertrophy.  Left ventricular diastolic parameters  are consistent with Grade II diastolic dysfunction (pseudonormalization).  2. Right ventricular systolic function is normal. The right ventricular  size is normal.  3. The mitral valve is normal in structure. Mild mitral valve  regurgitation. No evidence of mitral stenosis.  4. Gradient is less this time as compare to echocardiogram from  12/30/2019. Most likely missed higher velocity jet of AS at this study. All  other parameters indicate severe AS. The aortic valve is normal in  structure. Aortic valve regurgitation is mild.  Severe aortic valve stenosis. Aortic valve mean gradient measures 30.7  mmHg.  5. The inferior vena cava is normal in size with greater than 50%  respiratory variability, suggesting right atrial pressure of 3 mmHg.  )      Anesthesia Quick Evaluation

## 2021-03-02 NOTE — Progress Notes (Signed)
Pt arrived from ENDO. VSS. Will continue to monitor   Phoebe Sharps, RN

## 2021-03-02 NOTE — Telephone Encounter (Signed)
Pts daughter, Mickel Baas, left a message regarding the r/s of her breast imaging appt. I have attempted to call her back and was unable to leave a message.

## 2021-03-02 NOTE — Op Note (Signed)
[facility] Patient Name: Kim Ward Procedure Date : 03/02/2021 MRN: [id number] Attending MD: Jerene Bears , MD Date of Birth: 05-26-1931 CSN: [id number] Age: 87 Admit Type: Outpatient Procedure:                Upper GI endoscopy Indications:              Removal of food impaction in the esophagus, acute                            dysphagia Providers:                Lajuan Lines. Hilarie Fredrickson, MD, Erenest Rasher, RN, Fransico Setters                            Mbumina, Technician Referring MD:             [facility] Medicines:                General Anesthesia Complications:            No immediate complications. Estimated Blood Loss:     Estimated blood loss: none. Procedure:                Pre-Anesthesia Assessment:                           - Prior to the procedure, a History and Physical                            was performed, and patient medications and                            allergies were reviewed. The patient's tolerance of                            previous anesthesia was also reviewed. The risks                            and benefits of the procedure and the sedation                            options and risks were discussed with the patient.                            All questions were answered, and informed consent                            was obtained. Prior Anticoagulants: The patient has                            taken no previous anticoagulant or antiplatelet                            agents. ASA Grade Assessment: III - A patient with  severe systemic disease. After reviewing the risks                            and benefits, the patient was deemed in                            satisfactory condition to undergo the procedure.                           After obtaining informed consent, the endoscope was                            passed under direct vision. Throughout the                            procedure, the patient's  blood pressure, pulse, and                            oxygen saturations were monitored continuously. The                            GIF-H190 ([id number]) Olympus gastroscope was                            introduced through the mouth, and advanced to the                            second part of duodenum. The upper GI endoscopy was                            accomplished without difficulty. The patient                            tolerated the procedure well. Scope In: Scope Out: Findings:      Food was found in the lower third of the esophagus. Removal of food was       accomplished with Talon graspers. Once a portion of the food was removed       the scope was able to be carefully maneuvered around the impaction and       into the stomach. The remainder of the esophageal food bolus was lavaged       into the stomach. The esophageal was completely cleared.      One benign-appearing, intrinsic moderate (circumferential scarring or       stenosis; an endoscope may pass) stenosis was found at the       gastroesophageal junction at 40 cm from the incisors. This stenosis       measured 1.2 cm (inner diameter) x less than one cm (in length). The       stenosis was traversed. There is mild esophagitis in the lower 1/3 of       the esophagus due to food impaction.      Multiple diminutive sessile polyps were found in the gastric fundus and       in the gastric body. Polyps are benign appearing and felt to be fundic  gland polyps. Stomach otherwise normal.      The examined duodenum was normal. Impression:               - Food in the lower third of the esophagus. Removal                            was successful.                           - Benign-appearing esophageal stenosis and                            esophagitis from food impaction.                           - Multiple diminutive and benign gastric polyps.                           - Otherwise normal stomach.                            - Normal examined duodenum. Moderate Sedation:      N/A Recommendation:           - Return patient to hospital ward for ongoing care.                           - Full liquid diet and advance to soft diet as                            tolerated.                           - Continue present medications. Daily PPI given                            esophagitis.                           - GI will sign off, call if questions. Procedure Code(s):        --- Professional ---                           [phone], Esophagogastroduodenoscopy, flexible,                            transoral; with removal of foreign body(s) Diagnosis Code(s):        --- Professional ---                           B84.665L, Food in esophagus causing other injury,                            initial encounter                           K22.2, Esophageal obstruction  K31.7, Polyp of stomach and duodenum                           T18.108A, Unspecified foreign body in esophagus                            causing other injury, initial encounter CPT copyright 2019 [employer]. All rights reserved. The codes documented in this report are preliminary and upon coder review may  be revised to meet current compliance requirements. Jerene Bears, MD 03/02/2021 11:00:41 AM This report has been signed electronically. Number of Addenda: 0

## 2021-03-02 NOTE — Transfer of Care (Addendum)
Immediate Anesthesia Transfer of Care Note  Patient: Kim Ward  Procedure(s) Performed: ESOPHAGOGASTRODUODENOSCOPY (EGD) WITH PROPOFOL (N/A )  Patient Location: Endoscopy Unit  Anesthesia Type:General  Level of Consciousness: awake, oriented and patient cooperative  Airway & Oxygen Therapy: Patient Spontanous Breathing and Patient connected to nasal cannula oxygen  Post-op Assessment: Report given to RN and Post -op Vital signs reviewed and stable  Post vital signs: Reviewed  Last Vitals:  Vitals Value Taken Time  BP 129/40 03/02/21 1113  Temp 37.2 C 03/02/21 1113  Pulse 73 03/02/21 1121  Resp 22 03/02/21 1121  SpO2 95 % 03/02/21 1121  Vitals shown include unvalidated device data.  Last Pain:  Vitals:   03/02/21 1113  TempSrc: Oral  PainSc: 0-No pain         Complications: No complications documented.

## 2021-03-02 NOTE — Progress Notes (Signed)
87 year old woman with hypermetabolic right upper lobe mass, noted as part of preop eval for aortic stenosis/TAVR  She underwent redo bronchoscopy with EBUS.  Right upper lobe bronchus appeared extrinsically compressed, and her lymph node was biopsied, brushings and transbronchial biopsies were obtained from right upper lobe. Procedure was complicated by pneumothorax and aspiration was performed with removal of 400 cc of air  She was admitted for observation. Overnight she had pot roast and felt like a piece got stuck in her esophagus.  She was evaluated, GI was consulted.  This a.m., sitting at the side of the bed, daughter at bedside Feels like she was coughing and gagging all night and complains of lower substernal chest pain  Vitals:   03/02/21 0806 03/02/21 0926  BP: (!) 139/47 (!) 168/46  Pulse: 75 73  Resp: 18 20  Temp: 98.3 F (36.8 C) 98.2 F (36.8 C)  SpO2: 99% 98%   Elderly woman, mild distress, room air Mild crepitus over right neck Clear breath sounds bilateral S1-S2 regular,  soft nontender abdomen No edema  Chest x-ray 4/15 independently reviewed, shows minimal less than 5% right apical pneumothorax, subcu emphysema is improved  Labs show no leukocytosis, normal electrolytes.  Impression/plan Iatrogenic pneumothorax post bronchoscopy with subcu emphysema -improved, oxygenating well, stable  Food impaction/aspiration -I gave her a sip of water and she was unable to swallow and had to spit it out. Have consulted GI for EGD procedure for impaction  SVT -maintained on amiodarone and pindolol. We will use Lopressor IV as needed while she is n.p.o. and hopefully resume these medications post EGD  I am still hopeful we can discharge her in 24 hours once impaction episode is resolved  Kara Mead MD. Shade Flood. [facility] Pager : [phone]  If no response to pager , please call [phone] until 7 pm After 7:00 pm call Elink  [phone]    03/02/2021

## 2021-03-02 NOTE — Procedures (Addendum)
Discussed the findings at EGD as well as recommendations with the patient's daughter by phone after procedure  Time provided for questions and answers and she thanked me for the call

## 2021-03-02 NOTE — Plan of Care (Signed)
POC initiated and progressing. 

## 2021-03-02 NOTE — Consult Note (Signed)
Consultation  Referring Provider: Calvert Cantor Lucile Shutters primary Care Physician:  [name], NP Primary Gastroenterologist:  none.  Reason for Consultation:  Food impaction  HPI: Kim Ward is a 87 y.o. female, who was admitted yesterday, after outpatient CT imaging of the chest had shown a large right upper lobe mass and multiple borderline enlarged mediastinal and hilar nodes.  She had PET scan on 02/20/2021 that showed a small focus in the left breast. Patient underwent bronchoscopy yesterday with biopsies.  Procedure was complicated by pneumothorax, she underwent thoracentesis postprocedure with 400 cc of air removed.  Op last evening after returning to her room she was eating dinner, and got a piece of beef stuck in her esophagus.  This was associated with some coughing and also drooling.  After this she has not been able to swallow her own secretions. Early this morning did she did spit up a small amount of what looked like beef however still unable to handle her secretions.  She is not having any significant pain but says she feels like her lower chest/lower esophagus has been "in distress".  Patient has not had any prior GI issues, denies any chronic GERD She says she had not been having any difficulty swallowing prior to admission however her daughter in the room feels that she has had intermittent problems with dysphagia.  Other medical issues include severe aortic stenosis, hypertension, hyperlipidemia, hypothyroidism, diverticulosis, depression, and prior history of adenomatous polyps.      Past Medical History:  Diagnosis Date  . Adenomatous colon polyp   . Depression    Tx'd 19 yrs ago when husband passed away  . Diverticulosis   . Hyperlipidemia   . Hypertension   . Hypothyroidism    Dr. Dwyane Dee follows pt.--had small nodules but had decreased in size and stopped Synthroid 06/2013  . Osteoporosis   . Pure hypercholesterolemia   . Severe aortic stenosis   . SVT  (supraventricular tachycardia) (HCC)     Past Surgical History:  Procedure Laterality Date  . CERVICAL BIOPSY  W/ LOOP ELECTRODE EXCISION  1996   CIN I  . COLONOSCOPY  03/2001, 01/2007   2008 - no polyps  . DILATION AND CURETTAGE OF UTERUS    . LEFT HEART CATH AND CORONARY ANGIOGRAPHY N/A 01/12/2021   Procedure: LEFT HEART CATH AND CORONARY ANGIOGRAPHY;  Surgeon: Sherren Mocha, MD;  Location: [facility];  Service: Cardiovascular;  Laterality: N/A;  . TONSILLECTOMY      Prior to Admission medications   Medication Sig Start Date End Date Taking? Authorizing Provider  amiodarone (PACERONE) 200 MG tablet TAKE ONE (1) TABLET BY MOUTH EVERY DAY Patient taking differently: Take 200 mg by mouth daily. 02/13/21  Yes Vickie Epley, MD  amLODipine (NORVASC) 2.5 MG tablet Take 1 tablet (2.5 mg total) by mouth daily. Patient taking differently: Take 2.5 mg by mouth at bedtime. 10/31/20 10/31/21 Yes Bhagat, Bhavinkumar, PA  BIOTIN PO Take 1 tablet by mouth daily.    Yes [provider]  calcium-vitamin D (OSCAL WITH D) 500-200 MG-UNIT tablet Take 1 tablet by mouth daily with breakfast.   Yes [provider]  pindolol (VISKEN) 10 MG tablet Take 1 tablet (10 mg total) by mouth 2 (two) times daily. 10/31/20  Yes Bhagat, Crista Luria, PA  rosuvastatin (CRESTOR) 5 MG tablet Take 5 mg by mouth every Monday, Wednesday, and Friday. 02/21/20  Yes [provider]  amoxicillin (AMOXIL) 500 MG capsule Take 500 mg by  mouth as directed. 4 capsules before dental procedures    [provider]  ibuprofen (ADVIL) 200 MG tablet Take 400 mg by mouth every 6 (six) hours as needed for mild pain or headache. For pain    [provider]    Current Facility-Administered Medications  Medication Dose Route Frequency Provider Last Rate Last Admin  . amiodarone (PACERONE) tablet 200 mg  200 mg Oral Daily Corey Harold, NP      . amLODipine (NORVASC) tablet 2.5 mg  2.5 mg  Oral QHS Corey Harold, NP      . enoxaparin (LOVENOX) injection 40 mg  40 mg Subcutaneous Q24H Corey Harold, NP      . lactated ringers infusion   Intravenous Continuous Corey Harold, NP      . pantoprazole (PROTONIX) injection 40 mg  40 mg Intravenous QHS Icard, Bradley L, DO   40 mg at 03/01/21 2257  . pindolol (VISKEN) tablet 10 mg  10 mg Oral BID Corey Harold, NP      . rosuvastatin (CRESTOR) tablet 5 mg  5 mg Oral Q M,W,F Corey Harold, NP        Allergies as of 02/26/2021 - Review Complete 02/22/2021  Allergen Reaction Noted  . Augmentin [amoxicillin-pot clavulanate]  12/23/2013  . Azithromycin  10/10/2012  . Bextra [valdecoxib]  02/11/2011  . Cefuroxime Other (See Comments) 03/22/2015  . Cefuroxime axetil  03/22/2015  . Etodolac Other (See Comments) 04/01/2019  . Humibid la [guaifenesin] Other (See Comments) 12/23/2013  . Lipitor [atorvastatin calcium]  02/11/2011  . Ramipril Cough 02/11/2011  . Risedronate sodium  02/11/2011  . Tramadol Other (See Comments) 07/26/2015  . Zocor [simvastatin]  02/11/2011  . Niaspan [niacin er] Rash 02/11/2011    Family History  Problem Relation Age of Onset  . Stroke Sister   . Breast cancer Sister   . Breast cancer Daughter 13       metastatic breast ca    Social History   Socioeconomic History  . Marital status: Widowed    Spouse name: Not on file  . Number of children: 2  . Years of education: Not on file  . Highest education level: Not on file  Occupational History  . Occupation: Retired  Tobacco Use  . Smoking status: Former Smoker    Quit date: 06/17/1972    Years since quitting: 48.7  . Smokeless tobacco: Never Used  . Tobacco comment: quit June 17, 1972  Vaping Use  . Vaping Use: Never used  Substance and Sexual Activity  . Alcohol use: Yes    Alcohol/week: 0.0 standard drinks    Comment: 2 drinks per month  . Drug use: No  . Sexual activity: Never    Partners: Male    Birth control/protection:  Post-menopausal  Other Topics Concern  . Not on file  Social History Narrative  . Not on file   Social Determinants of Health   Financial Resource Strain: Not on file  Food Insecurity: Not on file  Transportation Needs: Not on file  Physical Activity: Not on file  Stress: Not on file  Social Connections: Not on file  Intimate Partner Violence: Not on file    Review of Systems: Pertinent positive and negative review of systems were noted in the above HPI section.  All other review of systems was otherwise negative.  Physical Exam: Vital signs in last 24 hours: Temp:  [97.6 F (36.4 C)-98.4 F (36.9 C)] 98.3 F (36.8  C) (04/15 0806) Pulse Rate:  [57-110] 75 (04/15 0806) Resp:  [12-20] 18 (04/15 0806) BP: (103-154)/(39-91) 139/47 (04/15 0806) SpO2:  [94 %-100 %] 99 % (04/15 0806) Arterial Line BP: (139-160)/(39-54) 160/54 (04/14 1655) Weight:  [78.9 kg] 78.9 kg (04/14 1039)   General:   Alert,  Well-developed, thin, frail-appearing elderly white female in no acute distress, pleasant and cooperative in NAD spitting saliva into emesis basin Head:  Normocephalic and atraumatic. Eyes:  Sclera clear, no icterus.   Conjunctiva pink. Ears:  Normal auditory acuity. Nose:  No deformity, discharge,  or lesions. Mouth:  No deformity or lesions.   Neck:  Supple; no masses or thyromegaly. Lungs:  Clear throughout to auscultation.   No wheezes, crackles, or rhonchi. Heart:  Regular rate and rhythm; systolic murmur Abdomen:  Soft,nontender, BS active,nonpalp mass or hsm.   Rectal:  Deferred  Msk:  Symmetrical without gross deformities. . Pulses:  Normal pulses noted. Extremities:  Without clubbing or edema. Neurologic:  Alert and  oriented x4;  grossly normal neurologically. Skin:  Intact without significant lesions or rashes.. Psych:  Alert and cooperative. Normal mood and affect.  Intake/Output from previous day: 04/14 0701 - 04/15 0700 In: 700 [I.V.:700] Out: 20  [Blood:20] Intake/Output this shift: No intake/output data recorded.  Lab Results: Recent Labs    03/01/21 1907  WBC 10.0  HGB 11.5*  HCT 37.0  PLT 243   BMET Recent Labs    03/01/21 1907  NA 139  K 4.3  CL 107  CO2 24  GLUCOSE 91  BUN 14  CREATININE 0.85  CALCIUM 9.0   LFT No results for input(s): PROT, ALBUMIN, AST, ALT, ALKPHOS, BILITOT, BILIDIR, IBILI in the last 72 hours. PT/INR No results for input(s): LABPROT, INR in the last 72 hours. Hepatitis Panel No results for input(s): HEPBSAG, HCVAB, HEPAIGM, HEPBIGM in the last 72 hours.     IMPRESSION:  #15 87 year old white female with new diagnosis of right upper lobe mass, malignancy with mediastinal adenopathy status post bronchoscopy with biopsies yesterday then thoracentesis for pneumothorax  #2 acute esophageal food impaction-onset last p.m. with dinner around 9 PM And still unable to handle secretions  #3 severe aortic stenosis #4 hypertension #5.  Hyperlipidemia   PLAN: N.p.o. Patient has been scheduled for upper endoscopy with removal of food impaction with Dr. Hilarie Fredrickson this morning.  Procedure was discussed with the patient and her daughter including indications risk and benefits and she is agreeable to proceed.  Due to severe AS she will have a line placed prior to sedation.  Further recommendations pending findings at EGD    [place] PA-C 03/02/2021, 8:45 AM

## 2021-03-02 NOTE — Anesthesia Postprocedure Evaluation (Signed)
Anesthesia Post Note  Patient: Kim Ward  Procedure(s) Performed: VIDEO BRONCHOSCOPY WITH ENDOBRONCHIAL ULTRASOUND (N/A ) BRONCHIAL NEEDLE ASPIRATION BIOPSIES BRONCHIAL BRUSHINGS BRONCHIAL BIOPSIES BRONCHIAL WASHINGS     Patient location during evaluation: PACU Anesthesia Type: General Level of consciousness: awake and alert Pain management: pain level controlled Vital Signs Assessment: post-procedure vital signs reviewed and stable Respiratory status: spontaneous breathing, nonlabored ventilation, respiratory function stable and patient connected to nasal cannula oxygen Cardiovascular status: blood pressure returned to baseline and stable Postop Assessment: no apparent nausea or vomiting Anesthetic complications: no   No complications documented.  Last Vitals:  Vitals:   03/02/21 0806 03/02/21 0926  BP: (!) 139/47 (!) 168/46  Pulse: 75 73  Resp: 18 20  Temp: 36.8 C 36.8 C  SpO2: 99% 98%    Last Pain:  Vitals:   03/02/21 0926  TempSrc: Oral  PainSc: 0-No pain                 March Rummage Meggan Dhaliwal

## 2021-03-02 NOTE — Anesthesia Procedure Notes (Signed)
Procedure Name: Intubation Date/Time: 03/02/2021 10:31 AM Performed by: Leonor Liv, CRNA Pre-anesthesia Checklist: Patient identified, Emergency Drugs available, Suction available and Patient being monitored Patient Re-evaluated:Patient Re-evaluated prior to induction Oxygen Delivery Method: Circle System Utilized Preoxygenation: Pre-oxygenation with 100% oxygen Induction Type: IV induction, Rapid sequence and Cricoid Pressure applied Laryngoscope Size: Mac and 3 Grade View: Grade I Tube type: Oral Tube size: 7.0 mm Number of attempts: 1 Airway Equipment and Method: Stylet and Oral airway Placement Confirmation: ETT inserted through vocal cords under direct vision,  positive ETCO2 and breath sounds checked- equal and bilateral Secured at: 21 cm Tube secured with: Tape Dental Injury: Teeth and Oropharynx as per pre-operative assessment

## 2021-03-02 NOTE — Anesthesia Procedure Notes (Signed)
Arterial Line Insertion Start/End4/15/2022 9:55 AM, 03/02/2021 10:05 AM Performed by: Leonor Liv, CRNA, CRNA  Patient location: Pre-op. Preanesthetic checklist: patient identified, IV checked, site marked, risks and benefits discussed, surgical consent, monitors and equipment checked, pre-op evaluation, timeout performed and anesthesia consent Lidocaine 1% used for infiltration Left, radial was placed Catheter size: 20 G Hand hygiene performed  and maximum sterile barriers used  Allen's test indicative of satisfactory collateral circulation Attempts: 2 (1 attempt venous) Procedure performed without using ultrasound guided technique. Following insertion, dressing applied and Biopatch. Post procedure assessment: normal  Patient tolerated the procedure well with no immediate complications.

## 2021-03-02 NOTE — Progress Notes (Signed)
Pt's BP is low and she is scheduled for her night time NORVASC and VISKEN, MD on call notified, advised to hold the 2 meds. We'll continue to monitor.

## 2021-03-03 ENCOUNTER — Observation Stay (HOSPITAL_COMMUNITY): Payer: Medicare PPO

## 2021-03-03 DIAGNOSIS — R918 Other nonspecific abnormal finding of lung field: Secondary | ICD-10-CM | POA: Diagnosis not present

## 2021-03-03 NOTE — Discharge Summary (Signed)
Physician Discharge Summary  Patient ID: Kim Ward MRN: [id number] DOB/AGE: 06/01/1931 87 y.o.  Admit date: 03/01/2021 Discharge date: 03/03/2021  Problem List Principal Problem:   Mass of lower lobe of right lung Active Problems:   Status post bronchoscopy   Pneumothorax, postprocedural   Postprocedural pneumothorax   Food impaction of esophagus  HPI: Chief complaint: Abnormal chest imaging  This is an 87 year old female, past medical history of hypertension, hyperlipidemia, severe aortic stenosis.  Had previously followed closely with cardiology and had evaluation by Dr. Burt Knack for potential TAVR.  During this time patient ultimately underwent CT scan of the chest for evaluation in March 2022.  This showed a large right upper lobe lung mass measuring 4.7 x 3.7 x 4.5 cm with multiple prominent borderline enlarged mediastinal and hilar nodes.  The decision was made for referral for biopsy.  Patient underwent PET scan on 02/20/2021.  It was hypermetabolic with an SUV elevation.  Additionally there was a small focus in the left breast with an SUV of 3.5.  She is also scheduled for a breast biopsy.  Patient presents today for evaluation perioperatively and discussed the risk benefits and alternatives of videobronchoscope with endobronchial ultrasound transbronchial needle aspirations.  Patient is a widow with 3 children.  I also discussed this with the patient's daughter.  Patient is a former smoker for approximately 10 years and he she quit 50 years ago.  Hospital Course: 03/01/2021 fiberoptic bronchoscopy was performed by Dr. Valeta Harms with multiple needle biopsies of right upper lobe mass.  Post procedure was noted to have small right apical pneumothorax.  This did not require a chest tube intervention.  She was monitored overnight on hospital and was ready for discharge on 03/03/2021.  Fortunately she had a choking episode 03/01/2021 and a GI consult was placed.  She had an upper endoscopy which  revealed food impaction which was cleared per the GI services.  And she has been placed on a soft diet until further evaluation and treatment.  Right apical pneumothorax post fiberoptic bronchoscopy   03/03/2021 near resolution of apical pneumothorax. Discharged home with follow-up with Dr. Valeta Harms with a portable chest x-ray on office visit.  Choking with food impaction noted GI consult Dr. Riley Kill     Status post upper endoscopy procedure with clearing of food impaction Soft diet  Aortic stenosis, SVT, hyper tension, goiter, macular degeneration Resume home medications Follow-up with appropriate physicians  General: Elderly female no acute distress, no further complaints of choking HEENT: No JVD lymphadenopathy is appreciated Neuro: Intact without focal defect CV: Lungs are distant PULM: Clear to auscultation Currently on room air with no respiratory distress sats 94% GI: soft, bsx4 active   Extremities: warm/dry,  edema  Skin: no rashes or lesions    Labs at discharge Lab Results  Component Value Date   CREATININE 0.85 03/01/2021   BUN 14 03/01/2021   NA 139 03/01/2021   K 4.3 03/01/2021   CL 107 03/01/2021   CO2 24 03/01/2021   Lab Results  Component Value Date   WBC 10.0 03/01/2021   HGB 11.5 (L) 03/01/2021   HCT 37.0 03/01/2021   MCV 92.0 03/01/2021   PLT 243 03/01/2021   Lab Results  Component Value Date   ALT 19 02/22/2021   AST 17 02/22/2021   ALKPHOS 79 02/22/2021   BILITOT 0.4 02/22/2021   No results found for: INR, PROTIME  Current radiology studies DG Neck Soft Tissue  Result Date: 03/01/2021 CLINICAL DATA:  Globus sensation after eating beef EXAM: NECK SOFT TISSUES - 1+ VIEW COMPARISON:  03/01/2021 FINDINGS: Frontal and lateral views of the soft tissues of the neck are obtained. There is extensive soft tissue gas within the subcutaneous tissues of the neck and throughout the retropharyngeal region, likely gas dissecting superiorly from known  right apical pneumothorax. The airway appears patent. Epiglottis is normal. There are no radiopaque foreign bodies identified. Food bolus impaction is not typically radiopaque. Small right apical pneumothorax is unchanged since recent chest x-ray. Right upper lobe lung mass is seen in the right paratracheal region. IMPRESSION: 1. Extensive retropharyngeal and subcutaneous emphysema, likely representing superior dissection of gas from known right apical pneumothorax. Stable small right apical pneumothorax since earlier chest x-ray. 2. No radiopaque foreign bodies. Please note that food impaction is not typically radiopaque. 3. Right upper lobe mass. Electronically Signed   By: Randa Ngo M.D.   On: 03/01/2021 22:43   X-ray chest PA or AP  Result Date: 03/01/2021 CLINICAL DATA:  Status post thoracentesis. EXAM: CHEST  1 VIEW COMPARISON:  Chest x-ray 03/01/2021, CT chest 01/22/2021 FINDINGS: The heart size and mediastinal contours are unchanged. Aortic arch calcifications. Thickened right paratracheal stripe with Redemonstration of a 4.9 cm paramediastinal right lung density. Nodular-like patchy airspace opacities overlying the right lung. Patchy airspace opacities of the right lung. No pulmonary edema. Possible trace bilateral pleural effusions. Interval decrease in size of a small right apical pneumothorax. No left pneumothorax. No acute osseous abnormality. Emphysema of the right neck soft tissues. IMPRESSION: 1. Interval decrease in size of a small right apical pneumothorax. 2. Possible trace bilateral pleural effusions. 3. Persistent 4.9 cm paramediastinal right lung mass. Nodular-like patchy airspace opacities overlying the right lung - underlying pulmonary nodules not excluded (findings could also represent vessel en face). 4. Patchy airspace opacities of the right lung that could represent a combination atelectasis and or infection/inflammation. Electronically Signed   By: Iven Finn M.D.   On:  03/01/2021 17:04   DG Chest Port 1 View  Result Date: 03/03/2021 CLINICAL DATA:  Pneumothorax EXAM: PORTABLE CHEST 1 VIEW COMPARISON:  Radiograph 03/02/2021 CT 01/22/2021 FINDINGS: There is a persistent trace right apical pneumothorax as well as subcutaneous emphysema about the base of the neck. Some gradient density in the left lung apex and lateral left chest possibly related to a skin fold. Could consider repositioning and reimaging. Redemonstration of a right upper lobe paramediastinal mass better assessed on comparison cross-sectional imaging 01/22/2021. The aorta is calcified. The remaining cardiomediastinal contours are unremarkable. Basilar atelectasis with otherwise clear lungs. No acute osseous or soft tissue abnormality. Degenerative changes are present in the imaged spine and shoulders. IMPRESSION: 1. Persistent trace right apical pneumothorax and subcutaneous emphysema about the base of the neck. 2. Some peripheral density in the left lung apex and lateral left chest possibly related to a skin fold, pneumothorax less favored. Could consider repositioning and reimaging. 3. Basilar atelectasis. 4. Stable right upper lobe paramediastinal mass. 5.  Aortic Atherosclerosis (ICD10-I70.0). Electronically Signed   By: Lovena Le M.D.   On: 03/03/2021 06:00   DG CHEST PORT 1 VIEW  Result Date: 03/02/2021 CLINICAL DATA:  Pneumothorax EXAM: PORTABLE CHEST 1 VIEW COMPARISON:  03/01/2021 FINDINGS: Tiny right apical pneumothorax appears stable since prior examination. Right upper lobe paramediastinal mass is unchanged, better appreciated on CT examination of 01/22/2021. No pneumothorax on the left. No pleural effusion. Cardiac size within normal limits. Pulmonary vascularity is normal. Stable subcutaneous gas is noted  within the right neck base IMPRESSION: Stable small right apical pneumothorax. Stable subcutaneous gas within the right neck base. Unchanged right upper lobe paramediastinal mass.  Electronically Signed   By: Fidela Salisbury MD   On: 03/02/2021 05:40   Portable chest 1 View  Result Date: 03/01/2021 CLINICAL DATA:  Pneumothorax EXAM: PORTABLE CHEST 1 VIEW COMPARISON:  03/01/2021, 4:44 p.m. FINDINGS: Mild cardiomegaly. Redemonstrated paramedian right upper lobe mass and lymphadenopathy. Interval decrease in volume of a right apical pneumothorax, less than 10% volume. Subcutaneous emphysema about the right neck. The visualized skeletal structures are unremarkable. IMPRESSION: 1. Interval decrease in volume of a right apical pneumothorax, less than 10% volume. 2. Redemonstrated paramedian right upper lobe mass and lymphadenopathy, better assessed by prior PET-CT. Electronically Signed   By: Eddie Candle M.D.   On: 03/01/2021 20:10   DG CHEST PORT 1 VIEW  Result Date: 03/01/2021 CLINICAL DATA:  Status post bronchoscopy EXAM: PORTABLE CHEST 1 VIEW COMPARISON:  PET-CT February 20, 2021 FINDINGS: There is a right apical, apicolateral, and apicomedial pneumothorax without tension component. The mass in the medial aspect of the right upper lobe is again noted. There is scattered atelectatic change on the right. The left lung is clear. Heart is upper normal in size with pulmonary vascularity normal on the left. Pulmonary vascularity on the right is distorted. There is aortic atherosclerosis. No evident bone lesions. IMPRESSION: Right apical/apicolateral and medial pneumothorax without tension component. Mass right upper lobe again noted. Areas of atelectatic change on the right. Left lung clear. Stable cardiac silhouette. Aortic Atherosclerosis (ICD10-I70.0). Critical Value/emergent results were called by telephone at the time of interpretation on 03/01/2021 at 3:58 pm to provider [name] , who verbally acknowledged these results. Electronically Signed   By: Lowella Grip III M.D.   On: 03/01/2021 15:59    Disposition:  Discharge disposition: 01-Home or Self Care       Discharge  Instructions    Call MD for:  difficulty breathing, headache or visual disturbances   Complete by: As directed    Call MD for:  persistant nausea and vomiting   Complete by: As directed    Diet - low sodium heart healthy   Complete by: As directed    Increase activity slowly   Complete by: As directed    No wound care   Complete by: As directed      Allergies as of 03/03/2021      Reactions   Augmentin [amoxicillin-pot Clavulanate]    Unknown    Azithromycin    Or any drug that ends in "mycin"   Bextra [valdecoxib]    Unknown   Cefuroxime Other (See Comments)   dizziness   Cefuroxime Axetil    Severe dizziness   Etodolac Other (See Comments)   Reaction unknown   Humibid La [guaifenesin] Other (See Comments)   Mouth sores   Lipitor [atorvastatin Calcium]    MYALGIAS   Ramipril Cough   Risedronate Sodium     GI ISSUES   Tramadol Other (See Comments)   Vomiting and diarrhea   Zocor [simvastatin]    MYALGIAS   Niaspan [niacin Er] Rash      Medication List    TAKE these medications   amiodarone 200 MG tablet Commonly known as: PACERONE TAKE ONE (1) TABLET BY MOUTH EVERY DAY What changed: how much to take   amLODipine 2.5 MG tablet Commonly known as: NORVASC Take 1 tablet (2.5 mg total) by mouth daily. What changed: when  to take this   amoxicillin 500 MG capsule Commonly known as: AMOXIL Take 500 mg by mouth as directed. 4 capsules before dental procedures   BIOTIN PO Take 1 tablet by mouth daily.   calcium-vitamin D 500-200 MG-UNIT tablet Commonly known as: OSCAL WITH D Take 1 tablet by mouth daily with breakfast.   ibuprofen 200 MG tablet Commonly known as: ADVIL Take 400 mg by mouth every 6 (six) hours as needed for mild pain or headache. For pain   pindolol 10 MG tablet Commonly known as: VISKEN Take 1 tablet (10 mg total) by mouth 2 (two) times daily.   rosuvastatin 5 MG tablet Commonly known as: CRESTOR Take 5 mg by mouth every Monday,  Wednesday, and Friday.       Follow-up Information    Go to Curt Bears, MD.   Specialty: Oncology Contact information: [address] [place] [address] [phone]        Schedule an appointment as soon as possible for a visit with Icard, Bradley L, DO.   Specialty: Pulmonary Disease Contact information: [address] [address] [phone]                Discharged Condition: fair  Time spent on discharge g 40 minutes.  Vital signs at Discharge. Temp:  [98.2 F (36.8 C)-98.9 F (37.2 C)] 98.3 F (36.8 C) (04/16 0817) Pulse Rate:  [60-113] 66 (04/16 0817) Resp:  [14-30] 16 (04/16 0817) BP: (98-168)/(35-58) 143/50 (04/16 0817) SpO2:  [90 %-100 %] 95 % (04/16 0817) Weight:  [78.9 kg] 78.9 kg (04/15 0926) Office follow up Special Information or instructions.  She will need portable chest x-ray on follow-up visit.   Signed: Richardson Landry Melia Hopes ACNP Acute Care Nurse Practitioner [place] Please consult [place] 03/03/2021, 8:23 AM

## 2021-03-03 NOTE — Plan of Care (Signed)
  Problem: Clinical Measurements: Goal: Postoperative complications will be avoided or minimized Outcome: Adequate for Discharge

## 2021-03-04 ENCOUNTER — Encounter (HOSPITAL_COMMUNITY): Payer: Self-pay | Admitting: Internal Medicine

## 2021-03-05 ENCOUNTER — Encounter: Payer: Self-pay | Admitting: *Deleted

## 2021-03-05 LAB — CYTOLOGY - NON PAP

## 2021-03-05 NOTE — Progress Notes (Signed)
Ms. Baray missed her breast bx last week due to her being in the hospital.  She is re-scheduled for 5/17 but Dr. Julien Nordmann would like this information sooner.  I reached out to [employer], Clarise Cruz to see if this can be moved up.  Wait for response.

## 2021-03-05 NOTE — Progress Notes (Signed)
I received a message from [facility] and they will get Kim Ward re-scheduled for a sooner appt.

## 2021-03-05 NOTE — Anesthesia Postprocedure Evaluation (Signed)
Anesthesia Post Note  Patient: Kim Ward  Procedure(s) Performed: ESOPHAGOGASTRODUODENOSCOPY (EGD) WITH PROPOFOL (N/A ) FOREIGN BODY REMOVAL     Patient location during evaluation: PACU Anesthesia Type: General Level of consciousness: sedated and patient cooperative Pain management: pain level controlled Vital Signs Assessment: post-procedure vital signs reviewed and stable Respiratory status: spontaneous breathing Cardiovascular status: stable Anesthetic complications: no   No complications documented.  Last Vitals:  Vitals:   03/03/21 0300 03/03/21 0817  BP: (!) 125/48 (!) 143/50  Pulse: 66 66  Resp: 20 16  Temp: 37 C 36.8 C  SpO2: 99% 95%    Last Pain:  Vitals:   03/03/21 0947  TempSrc:   PainSc: 0-No pain                 Nolon Nations

## 2021-03-06 NOTE — Progress Notes (Signed)
HPI: FU paroxysmal supraventricular tachycardia, AS,hypercholesterolemia, and benign hypertensive heart disease. Echocardiogram October 2021 showed normal LV function, mild left ventricular hypertrophy, grade 2 diastolic dysfunction, mild mitral regurgitation, moderate to severe aortic stenosis with mean gradient 31 mmHg and mild aortic insufficiency.  Had recurrent SVT.  Patient underwent cardiac catheterization February 2022 in anticipation of TAVR. Coronaries were normal. Carotid Dopplers March 2022 showed 1 to 39% bilateral stenosis In anticipation of TAVR patient had CTA March 2022.  There was note of a large right upper lobe mass with potential for metastatic disease noted; small to moderate right pleural effusion; multiple renal lesions and follow-up MRI recommended.  PET scan showed hypermetabolic right upper lobe mass and adrenals concerning for metastatic disease. There was also note of left breast uptake and follow-up mammogram recommended.  Renal lesions were felt to be cysts.  Since last seen,she has dyspnea with vigorous activities but not routine activities.  No orthopnea, PND, pedal edema, chest pain or syncope.  Current Outpatient Medications  Medication Sig Dispense Refill  . amiodarone (PACERONE) 200 MG tablet TAKE ONE (1) TABLET BY MOUTH EVERY DAY (Patient taking differently: Take 200 mg by mouth daily.) 90 tablet 3  . amLODipine (NORVASC) 2.5 MG tablet Take 1 tablet (2.5 mg total) by mouth daily. (Patient taking differently: Take 2.5 mg by mouth at bedtime.) 30 tablet 11  . amoxicillin (AMOXIL) 500 MG capsule Take 500 mg by mouth as directed. 4 capsules before dental procedures    . BIOTIN PO Take 1 tablet by mouth daily.     . calcium-vitamin D (OSCAL WITH D) 500-200 MG-UNIT tablet Take 1 tablet by mouth daily with breakfast.    . ibuprofen (ADVIL) 200 MG tablet Take 400 mg by mouth every 6 (six) hours as needed for mild pain or headache. For pain    . pindolol (VISKEN) 10  MG tablet Take 1 tablet (10 mg total) by mouth 2 (two) times daily. 60 tablet 6  . rosuvastatin (CRESTOR) 5 MG tablet Take 5 mg by mouth every Monday, Wednesday, and Friday.     Current Facility-Administered Medications  Medication Dose Route Frequency Provider Last Rate Last Admin  . sodium chloride flush (NS) 0.9 % injection 3 mL  3 mL Intravenous Q12H Stanford Breed Denice Bors, MD         Past Medical History:  Diagnosis Date  . Adenomatous colon polyp   . Depression    Tx'd 19 yrs ago when husband passed away  . Diverticulosis   . Hyperlipidemia   . Hypertension   . Hypothyroidism    Dr. Dwyane Dee follows pt.--had small nodules but had decreased in size and stopped Synthroid 06/2013  . Osteoporosis   . Pure hypercholesterolemia   . Severe aortic stenosis   . SVT (supraventricular tachycardia) (HCC)     Past Surgical History:  Procedure Laterality Date  . BRONCHIAL BIOPSY  03/01/2021   Procedure: BRONCHIAL BIOPSIES;  Surgeon: Garner Nash, DO;  Location: [facility];  Service: Pulmonary;;  . BRONCHIAL BRUSHINGS  03/01/2021   Procedure: BRONCHIAL BRUSHINGS;  Surgeon: Garner Nash, DO;  Location: [facility];  Service: Pulmonary;;  . BRONCHIAL NEEDLE ASPIRATION BIOPSY  03/01/2021   Procedure: BRONCHIAL NEEDLE ASPIRATION BIOPSIES;  Surgeon: Garner Nash, DO;  Location: [facility];  Service: Pulmonary;;  . BRONCHIAL WASHINGS  03/01/2021   Procedure: BRONCHIAL WASHINGS;  Surgeon: Garner Nash, DO;  Location: [facility];  Service: Pulmonary;;  . CERVICAL BIOPSY  W/  [place]   CIN I  . COLONOSCOPY  03/2001, 01/2007   2008 - no polyps  . DILATION AND CURETTAGE OF UTERUS    . ESOPHAGOGASTRODUODENOSCOPY (EGD) WITH PROPOFOL N/A 03/02/2021   Procedure: ESOPHAGOGASTRODUODENOSCOPY (EGD) WITH PROPOFOL;  Surgeon: Jerene Bears, MD;  Location: [facility];  Service: Gastroenterology;  Laterality: N/A;  . FOREIGN BODY REMOVAL  03/02/2021   Procedure: FOREIGN BODY  REMOVAL;  Surgeon: Jerene Bears, MD;  Location: [facility];  Service: Gastroenterology;;  . LEFT HEART CATH AND CORONARY ANGIOGRAPHY N/A 01/12/2021   Procedure: LEFT HEART CATH AND CORONARY ANGIOGRAPHY;  Surgeon: Sherren Mocha, MD;  Location: [facility];  Service: Cardiovascular;  Laterality: N/A;  . TONSILLECTOMY    . VIDEO BRONCHOSCOPY WITH ENDOBRONCHIAL ULTRASOUND N/A 03/01/2021   Procedure: VIDEO BRONCHOSCOPY WITH ENDOBRONCHIAL ULTRASOUND;  Surgeon: Garner Nash, DO;  Location: [facility];  Service: Pulmonary;  Laterality: N/A;    Social History   Socioeconomic History  . Marital status: Widowed    Spouse name: Not on file  . Number of children: 2  . Years of education: Not on file  . Highest education level: Not on file  Occupational History  . Occupation: Retired  Tobacco Use  . Smoking status: Former Smoker    Quit date: 06/17/1972    Years since quitting: 48.7  . Smokeless tobacco: Never Used  . Tobacco comment: quit June 17, 1972  Vaping Use  . Vaping Use: Never used  Substance and Sexual Activity  . Alcohol use: Yes    Alcohol/week: 0.0 standard drinks    Comment: 2 drinks per month  . Drug use: No  . Sexual activity: Never    Partners: Male    Birth control/protection: Post-menopausal  Other Topics Concern  . Not on file  Social History Narrative  . Not on file   Social Determinants of Health   Financial Resource Strain: Not on file  Food Insecurity: Not on file  Transportation Needs: Not on file  Physical Activity: Not on file  Stress: Not on file  Social Connections: Not on file  Intimate Partner Violence: Not on file    Family History  Problem Relation Age of Onset  . Stroke Sister   . Breast cancer Sister   . Breast cancer Daughter 57       metastatic breast ca    ROS: no fevers or chills, productive cough, hemoptysis, dysphasia, odynophagia, melena, hematochezia, dysuria, hematuria, rash, seizure activity, orthopnea, PND, pedal  edema, claudication. Remaining systems are negative.  Physical Exam: Well-developed well-nourished in no acute distress.  Skin is warm and dry.  HEENT is normal.  Neck is supple.  Chest is clear to auscultation with normal expansion.  Cardiovascular exam is regular rate and rhythm.  3/6 systolic murmur left sternal border. Abdominal exam nontender or distended. No masses palpated. Extremities show no edema. neuro grossly intact  A/P  1 aortic stenosis-patient had been scheduled for TAVR but has been diagnosed with probable non-small cell lung cancer stage IV.  Plans for TAVR are on hold at present.  Her symptoms are unchanged (dyspnea with vigorous activities but not routine activities).  We will consider proceeding with TAVR once her malignancy is treated.  2 supraventricular tachycardia-no recurrences with amiodarone.  We will continue as well as pindolol.  3 hypertension-blood pressure controlled.  Continue present medications.  4 hyperlipidemia-continue statin.  5 stage IV non-small cell lung cancer-per oncology.  Note they have also  ordered mammogram for possible breast lesion.  Kirk Ruths, MD

## 2021-03-08 ENCOUNTER — Telehealth: Payer: Self-pay | Admitting: Internal Medicine

## 2021-03-08 ENCOUNTER — Inpatient Hospital Stay: Payer: Medicare PPO

## 2021-03-08 ENCOUNTER — Inpatient Hospital Stay: Payer: Medicare PPO | Admitting: Internal Medicine

## 2021-03-08 NOTE — Telephone Encounter (Signed)
R/s appts per 4/21 sch msg. Pt's daughter is aware.

## 2021-03-09 ENCOUNTER — Encounter: Payer: Self-pay | Admitting: Internal Medicine

## 2021-03-09 ENCOUNTER — Encounter: Payer: Self-pay | Admitting: *Deleted

## 2021-03-09 NOTE — Progress Notes (Signed)
I followed up on Kim Ward schedule. I noticed she had an appt for breast bx on 5/17.  I reached out to Clarise Cruz to see if she could get a sooner appt.

## 2021-03-12 ENCOUNTER — Encounter: Payer: Self-pay | Admitting: *Deleted

## 2021-03-12 NOTE — Progress Notes (Signed)
I received a notification that patient has again cancelled her appt for breast bx.  She is schedule for 5/12.

## 2021-03-13 ENCOUNTER — Other Ambulatory Visit: Payer: Medicare PPO

## 2021-03-13 ENCOUNTER — Ambulatory Visit: Payer: Medicare PPO | Admitting: Cardiology

## 2021-03-14 ENCOUNTER — Other Ambulatory Visit: Payer: Self-pay

## 2021-03-14 ENCOUNTER — Encounter: Payer: Self-pay | Admitting: Cardiology

## 2021-03-14 ENCOUNTER — Ambulatory Visit: Payer: Medicare PPO | Admitting: Cardiology

## 2021-03-14 VITALS — BP 136/78 | HR 70 | Ht 66.0 in | Wt 153.4 lb

## 2021-03-14 DIAGNOSIS — I350 Nonrheumatic aortic (valve) stenosis: Secondary | ICD-10-CM | POA: Diagnosis not present

## 2021-03-14 DIAGNOSIS — I10 Essential (primary) hypertension: Secondary | ICD-10-CM | POA: Diagnosis not present

## 2021-03-14 DIAGNOSIS — I471 Supraventricular tachycardia: Secondary | ICD-10-CM

## 2021-03-14 DIAGNOSIS — E785 Hyperlipidemia, unspecified: Secondary | ICD-10-CM | POA: Diagnosis not present

## 2021-03-14 NOTE — Patient Instructions (Signed)
  Follow-Up: At [facility], you and your health needs are our priority.  As part of our continuing mission to provide you with exceptional heart care, we have created designated Provider Care Teams.  These Care Teams include your primary Cardiologist (physician) and Advanced Practice Providers (APPs -  Physician Assistants and Nurse Practitioners) who all work together to provide you with the care you need, when you need it.  We recommend signing up for the patient portal called "MyChart".  Sign up information is provided on this After Visit Summary.  MyChart is used to connect with patients for Virtual Visits (Telemedicine).  Patients are able to view lab/test results, encounter notes, upcoming appointments, etc.  Non-urgent messages can be sent to your provider as well.   To learn more about what you can do with MyChart, go to [email or web address].    Your next appointment:   3 month(s)  The format for your next appointment:   In Person  Provider:   Brian Crenshaw, MD    

## 2021-03-15 ENCOUNTER — Other Ambulatory Visit: Payer: Self-pay | Admitting: Internal Medicine

## 2021-03-15 DIAGNOSIS — R918 Other nonspecific abnormal finding of lung field: Secondary | ICD-10-CM

## 2021-03-15 DIAGNOSIS — N63 Unspecified lump in unspecified breast: Secondary | ICD-10-CM

## 2021-03-16 ENCOUNTER — Other Ambulatory Visit: Payer: Self-pay

## 2021-03-16 ENCOUNTER — Ambulatory Visit (INDEPENDENT_AMBULATORY_CARE_PROVIDER_SITE_OTHER): Payer: Medicare PPO

## 2021-03-16 ENCOUNTER — Encounter: Payer: Self-pay | Admitting: Adult Health

## 2021-03-16 ENCOUNTER — Ambulatory Visit: Payer: Medicare PPO | Admitting: Adult Health

## 2021-03-16 VITALS — BP 128/60 | HR 59 | Ht 65.0 in | Wt 154.0 lb

## 2021-03-16 DIAGNOSIS — Z8639 Personal history of other endocrine, nutritional and metabolic disease: Secondary | ICD-10-CM | POA: Insufficient documentation

## 2021-03-16 DIAGNOSIS — Z880 Allergy status to penicillin: Secondary | ICD-10-CM | POA: Insufficient documentation

## 2021-03-16 DIAGNOSIS — R918 Other nonspecific abnormal finding of lung field: Secondary | ICD-10-CM

## 2021-03-16 DIAGNOSIS — C3490 Malignant neoplasm of unspecified part of unspecified bronchus or lung: Secondary | ICD-10-CM | POA: Insufficient documentation

## 2021-03-16 DIAGNOSIS — E049 Nontoxic goiter, unspecified: Secondary | ICD-10-CM | POA: Insufficient documentation

## 2021-03-16 DIAGNOSIS — J95811 Postprocedural pneumothorax: Secondary | ICD-10-CM

## 2021-03-16 DIAGNOSIS — J309 Allergic rhinitis, unspecified: Secondary | ICD-10-CM | POA: Insufficient documentation

## 2021-03-16 DIAGNOSIS — G4700 Insomnia, unspecified: Secondary | ICD-10-CM | POA: Insufficient documentation

## 2021-03-16 DIAGNOSIS — I359 Nonrheumatic aortic valve disorder, unspecified: Secondary | ICD-10-CM | POA: Insufficient documentation

## 2021-03-16 DIAGNOSIS — M189 Osteoarthritis of first carpometacarpal joint, unspecified: Secondary | ICD-10-CM | POA: Insufficient documentation

## 2021-03-16 DIAGNOSIS — Z8669 Personal history of other diseases of the nervous system and sense organs: Secondary | ICD-10-CM | POA: Insufficient documentation

## 2021-03-16 DIAGNOSIS — C3411 Malignant neoplasm of upper lobe, right bronchus or lung: Secondary | ICD-10-CM

## 2021-03-16 NOTE — Progress Notes (Signed)
PCCM:  Thanks for seeing her.   Garner Nash, DO [facility] 03/16/2021 3:49 PM

## 2021-03-16 NOTE — Progress Notes (Signed)
@Patient  ID: Kim Ward, female    DOB: 1931/02/01, 87 y.o.   MRN: [id number]  Chief Complaint  Patient presents with  . Follow-up    Referring provider: [name], NP  HPI: 87 year old female former smoker seen for pulmonary consult during hospitalization April 2022 for right lung mass , patient underwent bronchoscopy complicated by iatrogenic right apical pneumothorax. Medical history significant for hypertension, severe aortic stenosis, A. fib  TEST/EVENTS :  CT chest January 22, 2021 showed a large right upper lobe mass 4.7 x 3.7 x 4.5 cm with multiple prominent borderline enlarged mediastinal and hilar lymph nodes, small to moderate right pleural effusion  PET scan February 21, 2021 showed hypermetabolic right upper lobe mass compatible with malignancy, hypermetabolic right hilar adenopathy and hypermetabolic bilateral adrenal lesions suspicious for metastatic disease.  Small focus of metabolic activity in the left breast at the 12 o'clock position max SUV 3.5.  MRI brain February 20, 2021 showed no evidence of intracranial metastatic disease.  03/16/2021 post hospital follow-up Patient returns for a post hospital follow-up.  Patient was recently seen for pulmonary consult during hospitalization April 2022 for right lung mass.  Patient underwent a bronchoscopy which was complicated by iatrogenic right apical pneumothorax.  Patient has severe aortic stenosis.  Is followed by cardiology.  Patient was being evaluated for possible TAVR.  During this time CT chest was completed and showed a large right upper lobe lung mass measuring 4.7 x 4.5 cm with prominent borderline enlarged mediastinal and hilar lymph nodes.  PET scan showed hypermetabolic with SUV elevation.  And also a small focus in the left breast at SUV of 3.5.  After video bronchoscopy with endobronchial ultrasound transbronchial needle aspirations patient had a small right apical pneumothorax that did not require chest tube  intervention.  Unfortunately patient had a choking episode on March 01, 2021 that required GI consult and upper endoscopy with food impaction requiring clearance.  Cytology returned positive for non-small cell carcinoma We reviewed .  Lives at [facility] , [place].  3 children, 1 daughter died of cancer. 2 adult children in [place] and [place].  We went over her cytology results.  She has an upcoming appointment with Dr. Earlie Server in 2 weeks to discuss treatment options.  She has an upcoming mammogram and ultrasound for the abnormal left breast area found on PET scan. Since discharge patient says she is doing okay she has no significant cough or shortness of breath.  She carries no diagnosis of COPD or asthma prior to diagnosis.  She denies any hemoptysis chest pain orthopnea or increased leg swelling.  She says she has been eating well since discharge.  She is had no episodes of choking.   Allergies  Allergen Reactions  . Augmentin [Amoxicillin-Pot Clavulanate]     Unknown   . Azithromycin     Or any drug that ends in "mycin"  . Bextra [Valdecoxib]     Unknown  . Cefuroxime Other (See Comments)    dizziness  . Cefuroxime Axetil     Severe dizziness  . Etodolac Other (See Comments)    Reaction unknown  . Humibid La [Guaifenesin] Other (See Comments)    Mouth sores  . Lipitor [Atorvastatin Calcium]     MYALGIAS  . Ramipril Cough  . Risedronate Sodium      GI ISSUES  . Tramadol Other (See Comments)    Vomiting and diarrhea  . Zocor [Simvastatin]     MYALGIAS  .  Niaspan [Niacin Er] Rash    Immunization History  Administered Date(s) Administered  . Influenza Split 08/19/2014, 09/27/2016, 08/18/2018  . Influenza, High Dose Seasonal PF 12/12/2015, 09/27/2016  . Influenza, Quadrivalent, Recombinant, Inj, Pf 10/07/2017  . Influenza,inj,Quad PF,6+ Mos 08/19/2014  . Influenza,inj,Quad PF,6-35 Mos 08/31/2018  . Influenza-Unspecified 09/27/2016, 08/18/2018  .  Moderna Sars-Covid-2 Vaccination 11/30/2019, 12/28/2019  . Pneumococcal Conjugate-13 10/12/2014, 10/13/2015  . Pneumococcal Polysaccharide-23 01/14/2008, 04/03/2020  . Tdap 09/14/2012  . Zoster Recombinat (Shingrix) 11/26/2018    Past Medical History:  Diagnosis Date  . Adenomatous colon polyp   . Depression    Tx'd 19 yrs ago when husband passed away  . Diverticulosis   . Hyperlipidemia   . Hypertension   . Hypothyroidism    Dr. Dwyane Dee follows pt.--had small nodules but had decreased in size and stopped Synthroid 06/2013  . Osteoporosis   . Pure hypercholesterolemia   . Severe aortic stenosis   . SVT (supraventricular tachycardia) (HCC)     Tobacco History: Social History   Tobacco Use  Smoking Status Former Smoker  . Quit date: 06/17/1972  . Years since quitting: 48.7  Smokeless Tobacco Never Used  Tobacco Comment   quit June 17, 1972   Counseling given: Not Answered Comment: quit June 17, 1972   Outpatient Medications Prior to Visit  Medication Sig Dispense Refill  . amiodarone (PACERONE) 200 MG tablet TAKE ONE (1) TABLET BY MOUTH EVERY DAY (Patient taking differently: Take 200 mg by mouth daily.) 90 tablet 3  . amLODipine (NORVASC) 2.5 MG tablet Take 1 tablet (2.5 mg total) by mouth daily. (Patient taking differently: Take 2.5 mg by mouth at bedtime.) 30 tablet 11  . amoxicillin (AMOXIL) 500 MG capsule Take 500 mg by mouth as directed. 4 capsules before dental procedures    . BIOTIN PO Take 1 tablet by mouth daily.     . calcium-vitamin D (OSCAL WITH D) 500-200 MG-UNIT tablet Take 1 tablet by mouth daily with breakfast.    . ibuprofen (ADVIL) 200 MG tablet Take 400 mg by mouth every 6 (six) hours as needed for mild pain or headache. For pain    . pindolol (VISKEN) 10 MG tablet Take 1 tablet (10 mg total) by mouth 2 (two) times daily. 60 tablet 6  . rosuvastatin (CRESTOR) 5 MG tablet Take 5 mg by mouth every Monday, Wednesday, and Friday.     Facility-Administered  Medications Prior to Visit  Medication Dose Route Frequency Provider Last Rate Last Admin  . sodium chloride flush (NS) 0.9 % injection 3 mL  3 mL Intravenous Q12H Lelon Perla, MD         Review of Systems:   Constitutional:   No  weight loss, night sweats,  Fevers, chills,  +fatigue, or  lassitude.  HEENT:   No headaches,  Difficulty swallowing,  Tooth/dental problems, or  Sore throat,                No sneezing, itching, ear ache, nasal congestion, post nasal drip,   CV:  No chest pain,  Orthopnea, PND, swelling in lower extremities, anasarca, dizziness, palpitations, syncope.   GI  No heartburn, indigestion, abdominal pain, nausea, vomiting, diarrhea, change in bowel habits, loss of appetite, bloody stools.   Resp: No shortness of breath with exertion or at rest.  No excess mucus, no productive cough,  No non-productive cough,  No coughing up of blood.  No change in color of mucus.  No wheezing.  No chest  wall deformity  Skin: no rash or lesions.  GU: no dysuria, change in color of urine, no urgency or frequency.  No flank pain, no hematuria   MS:  No joint pain or swelling.  No decreased range of motion.  No back pain.    Physical Exam  BP 128/60   Pulse (!) 59   Ht 5\' 5"  (1.651 m)   Wt 154 lb (69.9 kg)   LMP 11/19/1983   SpO2 98%   BMI 25.63 kg/m   GEN: A/Ox3; pleasant , NAD, well nourished    HEENT:  [place]/AT,  , NOSE-clear, THROAT-clear, no lesions, no postnasal drip or exudate noted.   NECK:  Supple w/ fair ROM; no JVD; normal carotid impulses w/o bruits; no thyromegaly or nodules palpated; no lymphadenopathy.    RESP  Clear  P & A; w/o, wheezes/ rales/ or rhonchi. no accessory muscle use, no dullness to percussion  CARD:  RRR, grade 2 systolic murmur no peripheral edema, pulses intact, no cyanosis or clubbing.  GI:   Soft & nt; nml bowel sounds; no organomegaly or masses detected.   Musco: Warm bil, no deformities or joint swelling noted.   Neuro:  alert, no focal deficits noted.    Skin: Warm, no lesions or rashes    Lab Results:  CBC   BNP No results found for: BNP  ProBNP No results found for: PROBNP  Imaging: DG Neck Soft Tissue  Result Date: 03/01/2021 CLINICAL DATA:  Globus sensation after eating beef EXAM: NECK SOFT TISSUES - 1+ VIEW COMPARISON:  03/01/2021 FINDINGS: Frontal and lateral views of the soft tissues of the neck are obtained. There is extensive soft tissue gas within the subcutaneous tissues of the neck and throughout the retropharyngeal region, likely gas dissecting superiorly from known right apical pneumothorax. The airway appears patent. Epiglottis is normal. There are no radiopaque foreign bodies identified. Food bolus impaction is not typically radiopaque. Small right apical pneumothorax is unchanged since recent chest x-ray. Right upper lobe lung mass is seen in the right paratracheal region. IMPRESSION: 1. Extensive retropharyngeal and subcutaneous emphysema, likely representing superior dissection of gas from known right apical pneumothorax. Stable small right apical pneumothorax since earlier chest x-ray. 2. No radiopaque foreign bodies. Please note that food impaction is not typically radiopaque. 3. Right upper lobe mass. Electronically Signed   By: Randa Ngo M.D.   On: 03/01/2021 22:43   X-ray chest PA or AP  Result Date: 03/01/2021 CLINICAL DATA:  Status post thoracentesis. EXAM: CHEST  1 VIEW COMPARISON:  Chest x-ray 03/01/2021, CT chest 01/22/2021 FINDINGS: The heart size and mediastinal contours are unchanged. Aortic arch calcifications. Thickened right paratracheal stripe with Redemonstration of a 4.9 cm paramediastinal right lung density. Nodular-like patchy airspace opacities overlying the right lung. Patchy airspace opacities of the right lung. No pulmonary edema. Possible trace bilateral pleural effusions. Interval decrease in size of a small right apical pneumothorax. No left pneumothorax.  No acute osseous abnormality. Emphysema of the right neck soft tissues. IMPRESSION: 1. Interval decrease in size of a small right apical pneumothorax. 2. Possible trace bilateral pleural effusions. 3. Persistent 4.9 cm paramediastinal right lung mass. Nodular-like patchy airspace opacities overlying the right lung - underlying pulmonary nodules not excluded (findings could also represent vessel en face). 4. Patchy airspace opacities of the right lung that could represent a combination atelectasis and or infection/inflammation. Electronically Signed   By: Iven Finn M.D.   On: 03/01/2021 17:04   MR Brain W  Wo Contrast  Result Date: 02/20/2021 CLINICAL DATA:  Metastatic disease evaluation.  Lung mass. EXAM: MRI HEAD WITHOUT AND WITH CONTRAST TECHNIQUE: Multiplanar, multiecho pulse sequences of the brain and surrounding structures were obtained without and with intravenous contrast. CONTRAST:  23mL GADAVIST GADOBUTROL 1 MMOL/ML IV SOLN COMPARISON:  None. FINDINGS: Brain: No acute infarction, hemorrhage, hydrocephalus, extra-axial collection or mass lesion. A few scattered foci of T2 hyperintensity are seen within the white matter of the cerebral hemispheres, nonspecific. Small developmental venous anomaly in the pons. No focus of abnormal contrast enhancement. Vascular: Normal flow voids. Skull and upper cervical spine: Normal marrow signal. Sinuses/Orbits: Bilateral lens surgery. Paranasal sinuses are clear. Other: Small bilateral mastoid effusion. IMPRESSION: 1. No evidence of intracranial metastatic disease. 2. A few scattered foci of T2 hyperintensity within the white matter of the cerebral hemispheres, nonspecific. This may represent mild chronic microvascular ischemic changes. 3. Small bilateral mastoid effusion. Electronically Signed   By: Pedro Earls M.D.   On: 02/20/2021 16:50   NM PET Image Initial (PI) Skull Base To Thigh  Result Date: 02/21/2021 CLINICAL DATA:  Initial treatment  strategy for lung nodule. EXAM: NUCLEAR MEDICINE PET SKULL BASE TO THIGH TECHNIQUE: 7.8 mCi F-18 FDG was injected intravenously. Full-ring PET imaging was performed from the skull base to thigh after the radiotracer. CT data was obtained and used for attenuation correction and anatomic localization. Fasting blood glucose: 89 mg/dl COMPARISON:  Chest CT 01/22/2021 FINDINGS: Mediastinal blood pool activity: SUV max 2.1 Liver activity: SUV max NA NECK: No significant abnormal hypermetabolic activity in this region. Incidental CT findings: Bilateral common carotid atherosclerotic calcifications. Small hypodense thyroid nodules measure up to 1.0 cm in diameter. These are not hypermetabolic. Not clinically significant; no follow-up imaging recommended (ref: J Am Coll Radiol. 2015 Feb;12(2): 143-50). CHEST: Medial right upper lobe mass abutting the mediastinum and paraspinal region measures about 4.7 by 3.7 cm on image 50 of series 4 with maximum SUV 16.4. Reduced activity centrally compatible with central necrosis. Adjacent right hilar adenopathy as shown on CT scan, maximum SUV 7.3. Left infrahilar lymph node has faintly accentuated metabolic activity with maximum SUV 3.6. Similarly a small subcarinal node has faintly accentuated metabolic activity, maximum SUV 3.3. Left upper breast small focus of activity at the 12 o'clock position, maximum SUV 3.5. A comparable region in the normal right breast has a maximum SUV of 1.4. Incidental CT findings: none ABDOMEN/PELVIS: The small but hypermetabolic left adrenal mass approximately 1.5 by 1.3 cm on image 102 of series 4 with maximum SUV 12.1, suspicious for metastatic disease. Precontrast internal density 33 Hounsfield units. There is likewise fullness of the right adrenal gland especially along the lateral limb which measures up to 8 mm in thickness, maximum SUV 6.8, likewise concerning for a small metastatic lesion given the high activity. Incidental CT findings: Bilateral  renal cysts, some of the renal lesions are complex and too small to characterize by PET-CT, but could well simply represent complex cysts. Aortoiliac atherosclerotic vascular disease. SKELETON: Accentuated activity at the right first sternocostal joint, maximum SUV 3.4, likely inflammatory given the lack of other CT findings. Similarly, faint activity along the right upper iliac side of the SI joint, maximum SUV 3.1, likely inflammatory. No compelling findings of osseous metastatic disease. Incidental CT findings: Dextroconvex thoracolumbar scoliosis with rotary component. IMPRESSION: 1. Hypermetabolic right upper lobe mass compatible with malignancy. Hypermetabolic right hilar adenopathy and hypermetabolic bilateral adrenal lesions suspicious for metastatic disease. 2. Trace right pleural  effusion, indeterminate for malignant involvement. 3. Small focus of accentuated metabolic activity in the left breast at the 12 o'clock position, maximum SUV 3.5. On the patient's last elbow mammogram from 07/21/2018, note was made of dense breast tissue. Follow up diagnostic mammography is recommended for further assessment 4. Other imaging findings of potential clinical significance: Aortic Atherosclerosis (ICD10-I70.0). Bilateral renal cysts, along with some small complex renal lesions which are probably complex cysts but technically too small to characterize. Dextroconvex thoracolumbar scoliosis with rotary component. Electronically Signed   By: Van Clines M.D.   On: 02/21/2021 07:43   DG Chest Port 1 View  Result Date: 03/03/2021 CLINICAL DATA:  Pneumothorax EXAM: PORTABLE CHEST 1 VIEW COMPARISON:  Radiograph 03/02/2021 CT 01/22/2021 FINDINGS: There is a persistent trace right apical pneumothorax as well as subcutaneous emphysema about the base of the neck. Some gradient density in the left lung apex and lateral left chest possibly related to a skin fold. Could consider repositioning and reimaging.  Redemonstration of a right upper lobe paramediastinal mass better assessed on comparison cross-sectional imaging 01/22/2021. The aorta is calcified. The remaining cardiomediastinal contours are unremarkable. Basilar atelectasis with otherwise clear lungs. No acute osseous or soft tissue abnormality. Degenerative changes are present in the imaged spine and shoulders. IMPRESSION: 1. Persistent trace right apical pneumothorax and subcutaneous emphysema about the base of the neck. 2. Some peripheral density in the left lung apex and lateral left chest possibly related to a skin fold, pneumothorax less favored. Could consider repositioning and reimaging. 3. Basilar atelectasis. 4. Stable right upper lobe paramediastinal mass. 5.  Aortic Atherosclerosis (ICD10-I70.0). Electronically Signed   By: Lovena Le M.D.   On: 03/03/2021 06:00   DG CHEST PORT 1 VIEW  Result Date: 03/02/2021 CLINICAL DATA:  Pneumothorax EXAM: PORTABLE CHEST 1 VIEW COMPARISON:  03/01/2021 FINDINGS: Tiny right apical pneumothorax appears stable since prior examination. Right upper lobe paramediastinal mass is unchanged, better appreciated on CT examination of 01/22/2021. No pneumothorax on the left. No pleural effusion. Cardiac size within normal limits. Pulmonary vascularity is normal. Stable subcutaneous gas is noted within the right neck base IMPRESSION: Stable small right apical pneumothorax. Stable subcutaneous gas within the right neck base. Unchanged right upper lobe paramediastinal mass. Electronically Signed   By: Fidela Salisbury MD   On: 03/02/2021 05:40   Portable chest 1 View  Result Date: 03/01/2021 CLINICAL DATA:  Pneumothorax EXAM: PORTABLE CHEST 1 VIEW COMPARISON:  03/01/2021, 4:44 p.m. FINDINGS: Mild cardiomegaly. Redemonstrated paramedian right upper lobe mass and lymphadenopathy. Interval decrease in volume of a right apical pneumothorax, less than 10% volume. Subcutaneous emphysema about the right neck. The visualized  skeletal structures are unremarkable. IMPRESSION: 1. Interval decrease in volume of a right apical pneumothorax, less than 10% volume. 2. Redemonstrated paramedian right upper lobe mass and lymphadenopathy, better assessed by prior PET-CT. Electronically Signed   By: Eddie Candle M.D.   On: 03/01/2021 20:10   DG CHEST PORT 1 VIEW  Result Date: 03/01/2021 CLINICAL DATA:  Status post bronchoscopy EXAM: PORTABLE CHEST 1 VIEW COMPARISON:  PET-CT February 20, 2021 FINDINGS: There is a right apical, apicolateral, and apicomedial pneumothorax without tension component. The mass in the medial aspect of the right upper lobe is again noted. There is scattered atelectatic change on the right. The left lung is clear. Heart is upper normal in size with pulmonary vascularity normal on the left. Pulmonary vascularity on the right is distorted. There is aortic atherosclerosis. No evident bone lesions. IMPRESSION: Right  apical/apicolateral and medial pneumothorax without tension component. Mass right upper lobe again noted. Areas of atelectatic change on the right. Left lung clear. Stable cardiac silhouette. Aortic Atherosclerosis (ICD10-I70.0). Critical Value/emergent results were called by telephone at the time of interpretation on 03/01/2021 at 3:58 pm to provider [name] , who verbally acknowledged these results. Electronically Signed   By: Lowella Grip III M.D.   On: 03/01/2021 15:59      No flowsheet data found.  No results found for: NITRICOXIDE      Assessment & Plan:   No problem-specific Assessment & Plan notes found for this encounter.     Rexene Edison, NP 03/16/2021

## 2021-03-16 NOTE — Patient Instructions (Addendum)
Chest xray today  Activity as tolerated.  Follow up with Mammogram and Ultrasound  Follow up with Oncology as planned.  Follow up with Dr. Valeta Harms in 2-3 months and As needed   Please contact office for sooner follow up if symptoms do not improve or worsen or seek emergency care

## 2021-03-16 NOTE — Assessment & Plan Note (Signed)
Iatrogenic right apical pneumothorax.  Clinically patient appears to be improved.  We will check chest x-ray today for resolution.

## 2021-03-16 NOTE — Assessment & Plan Note (Addendum)
Right upper lobe lung mass with biopsy positive for non-small cell lung cancer PET scan with evidence of metastatic disease Went over test results with patient and answered any questions that she had.  Support was provided.  Patient has an upcoming appointment with Dr. Earlie Server to layout treatment plans.  Plan Patient Instructions  Chest xray today  Activity as tolerated.  Follow up with Mammogram and Ultrasound  Follow up with Oncology as planned.  Follow up with Dr. Valeta Harms in 2-3 months and As needed   Please contact office for sooner follow up if symptoms do not improve or worsen or seek emergency care

## 2021-03-19 NOTE — Progress Notes (Signed)
Called and spoke with patient, advised of results/recommendations per Rexene Edison NP.  She verbalized understanding.  Nothing further needed,.

## 2021-03-29 ENCOUNTER — Other Ambulatory Visit: Payer: Self-pay

## 2021-03-29 ENCOUNTER — Ambulatory Visit
Admission: RE | Admit: 2021-03-29 | Discharge: 2021-03-29 | Disposition: A | Discharge status: Home / Self Care | Payer: Medicare PPO | Source: Ambulatory Visit | Attending: Internal Medicine | Admitting: Internal Medicine

## 2021-03-29 ENCOUNTER — Other Ambulatory Visit: Payer: Self-pay | Admitting: Internal Medicine

## 2021-03-29 DIAGNOSIS — N63 Unspecified lump in unspecified breast: Secondary | ICD-10-CM

## 2021-03-29 DIAGNOSIS — R918 Other nonspecific abnormal finding of lung field: Secondary | ICD-10-CM

## 2021-03-30 ENCOUNTER — Other Ambulatory Visit: Payer: Self-pay | Admitting: Physician Assistant

## 2021-03-30 DIAGNOSIS — C3411 Malignant neoplasm of upper lobe, right bronchus or lung: Secondary | ICD-10-CM

## 2021-04-02 ENCOUNTER — Other Ambulatory Visit: Payer: Self-pay

## 2021-04-02 ENCOUNTER — Inpatient Hospital Stay: Payer: Medicare PPO

## 2021-04-02 ENCOUNTER — Inpatient Hospital Stay: Payer: Medicare PPO | Attending: Internal Medicine | Admitting: Internal Medicine

## 2021-04-02 ENCOUNTER — Encounter: Payer: Self-pay | Admitting: Internal Medicine

## 2021-04-02 ENCOUNTER — Other Ambulatory Visit: Payer: Self-pay | Admitting: *Deleted

## 2021-04-02 ENCOUNTER — Encounter: Payer: Self-pay | Admitting: Emergency Medicine

## 2021-04-02 VITALS — BP 144/62 | HR 63 | Temp 97.8°F | Resp 18 | Ht 65.0 in | Wt 151.8 lb

## 2021-04-02 DIAGNOSIS — N6320 Unspecified lump in the left breast, unspecified quadrant: Secondary | ICD-10-CM | POA: Diagnosis not present

## 2021-04-02 DIAGNOSIS — C3431 Malignant neoplasm of lower lobe, right bronchus or lung: Secondary | ICD-10-CM | POA: Diagnosis not present

## 2021-04-02 DIAGNOSIS — Z006 Encounter for examination for normal comparison and control in clinical research program: Secondary | ICD-10-CM

## 2021-04-02 DIAGNOSIS — C7972 Secondary malignant neoplasm of left adrenal gland: Secondary | ICD-10-CM | POA: Diagnosis not present

## 2021-04-02 DIAGNOSIS — Z86010 Personal history of colonic polyps: Secondary | ICD-10-CM | POA: Insufficient documentation

## 2021-04-02 DIAGNOSIS — C3411 Malignant neoplasm of upper lobe, right bronchus or lung: Secondary | ICD-10-CM

## 2021-04-02 DIAGNOSIS — Z87891 Personal history of nicotine dependence: Secondary | ICD-10-CM | POA: Insufficient documentation

## 2021-04-02 DIAGNOSIS — C7971 Secondary malignant neoplasm of right adrenal gland: Secondary | ICD-10-CM | POA: Insufficient documentation

## 2021-04-02 LAB — CBC WITH DIFFERENTIAL (CANCER CENTER ONLY)
Abs Immature Granulocytes: 0.01 10*3/uL (ref 0.00–0.07)
Basophils Absolute: 0.1 10*3/uL (ref 0.0–0.1)
Basophils Relative: 1 %
Eosinophils Absolute: 0.2 10*3/uL (ref 0.0–0.5)
Eosinophils Relative: 2 %
HCT: 39.5 % (ref 36.0–46.0)
Hemoglobin: 12.4 g/dL (ref 12.0–15.0)
Immature Granulocytes: 0 %
Lymphocytes Relative: 21 %
Lymphs Abs: 1.7 10*3/uL (ref 0.7–4.0)
MCH: 27.7 pg (ref 26.0–34.0)
MCHC: 31.4 g/dL (ref 30.0–36.0)
MCV: 88.2 fL (ref 80.0–100.0)
Monocytes Absolute: 0.7 10*3/uL (ref 0.1–1.0)
Monocytes Relative: 9 %
Neutro Abs: 5.3 10*3/uL (ref 1.7–7.7)
Neutrophils Relative %: 67 %
Platelet Count: 279 10*3/uL (ref 150–400)
RBC: 4.48 MIL/uL (ref 3.87–5.11)
RDW: 14.6 % (ref 11.5–15.5)
WBC Count: 7.9 10*3/uL (ref 4.0–10.5)
nRBC: 0 % (ref 0.0–0.2)

## 2021-04-02 LAB — CMP (CANCER CENTER ONLY)
ALT: 18 U/L (ref 0–44)
AST: 20 U/L (ref 15–41)
Albumin: 3.7 g/dL (ref 3.5–5.0)
Alkaline Phosphatase: 91 U/L (ref 38–126)
Anion gap: 7 (ref 5–15)
BUN: 16 mg/dL (ref 8–23)
CO2: 28 mmol/L (ref 22–32)
Calcium: 9.4 mg/dL (ref 8.9–10.3)
Chloride: 106 mmol/L (ref 98–111)
Creatinine: 1.01 mg/dL — ABNORMAL HIGH (ref 0.44–1.00)
GFR, Estimated: 54 mL/min — ABNORMAL LOW (ref >60)
Glucose, Bld: 91 mg/dL (ref 70–99)
Potassium: 4.2 mmol/L (ref 3.5–5.1)
Sodium: 141 mmol/L (ref 135–145)
Total Bilirubin: 0.5 mg/dL (ref 0.3–1.2)
Total Protein: 7 g/dL (ref 6.5–8.1)

## 2021-04-02 NOTE — Research (Signed)
[id number] - Customer service manager for the Discovery and Validation of Biomarkers for the Prediction, Diagnosis, and Management of Disease  04/02/21  Consent:  Patient Kim Ward was identified by Carol Ada, CRC as a potential candidate for the above listed study.  This Clinical Research Coordinator met with DELMAR DONDERO, MRN [id number], on 04/02/21 in a manner and location that ensures patient privacy to discuss participation in the above listed research study.  Patient is Accompanied by her son.  A copy of the informed consent document and separate HIPAA Authorization was provided to the patient.  Patient reads, speaks, and understands [place].    Patient was provided with the business card of this Coordinator and encouraged to contact the research team with any questions.  Patient was provided the option of taking informed consent documents home to review and was encouraged to review at their convenience with their support network, including other care providers. Patient is comfortable with making a decision regarding study participation today.  As outlined in the informed consent form, this Coordinator and [name] discussed the purpose of the research study, the investigational nature of the study, study procedures and requirements for study participation, potential risks and benefits of study participation, as well as alternatives to participation. This study is not blinded. The patient understands participation is voluntary and they may withdraw from study participation at any time.  Patient understands enrollment is pending full eligibility review.   Confidentiality and how the patient's information will be used as part of study participation were discussed.  Patient was informed there is reimbursement provided for their time and effort spent on trial participation.  The patient is encouraged to discuss research study participation with their insurance provider to  determine what costs they may incur as part of study participation, including research related injury.    All questions were answered to patient's satisfaction.  The informed consent and separate HIPAA Authorization was reviewed page by page.  The patient's mental and emotional status is appropriate to provide informed consent, and the patient verbalizes an understanding of study participation.  Patient has agreed to participate in the above listed research study and has voluntarily signed the informed consent version 2 and separate HIPAA Authorization, version 5 on 04/02/21 at 1156 AM.  The patient was provided with a copy of the signed informed consent form and separate HIPAA Authorization for their reference.  No study specific procedures were obtained prior to the signing of the informed consent document.  Approximately 15 minutes were spent with the patient reviewing the informed consent documents.    Eligibility:  This Coordinator has reviewed this patient's inclusion and exclusion criteria and confirmed [name] is eligible for study participation.  Patient will continue with enrollment.  Second eligibility review performed by Foye Spurling, RN, Clinical Research Nurse.  Specimen Collection:  Research specimen was collected at 1253 by Bernita Raisin via venipuncture.  Gift Card: Patient was given $50 visa gift card for participation in the study.   Clabe Seal Clinical Research Coordinator I  04/02/21 1:07 PM

## 2021-04-02 NOTE — Progress Notes (Signed)
[place] [phone]   [phone]  OFFICE PROGRESS NOTE  Kim Spearing, NP [address] [place] [address]  DIAGNOSIS: Stage IV (T3, N2, M1 C) non-small cell lung carcinoma and probably adenocarcinoma in this patient with remote history of smoking diagnosed in April 2022.  She presented with large right lower lobe lung mass in addition to right hilar and mediastinal lymphadenopathy as well as bilateral adrenal metastasis.  She also has a suspicious hypermetabolic focus in the left breast that need further evaluation to rule out the possibility of second malignancy in the breast.  There was insufficient material for molecular testing.  PRIOR THERAPY: None  CURRENT THERAPY: None  INTERVAL HISTORY: Kim Ward 87 y.o. female returns to the clinic today for follow-up visit accompanied by her son Aaron Edelman.  The patient is feeling fine today with no concerning complaints except for fatigue.  She denied having any current chest pain, shortness of breath, cough or hemoptysis.  She denied having any fever or chills.  She has no nausea, vomiting, diarrhea or constipation.  She has no headache or visual changes.  She has no significant weight loss or night sweats.  She had several studies performed recently including a mammogram as well as ultrasound of the breast but no tissue confirmation for her suspicious breast lesion.  The patient also underwent bronchoscopy with endobronchial ultrasound biopsy under the care of Dr. Valeta Harms and the final pathology was consistent with non-small cell carcinoma but there was insufficient material for additional studies to identify the subtype of the lung cancer or molecular studies.  The patient is here today for evaluation and discussion of her treatment options.  MEDICAL HISTORY: Past Medical History:  Diagnosis Date  . Adenomatous colon polyp   . Depression    Tx'd 19 yrs ago when husband passed away  .  Diverticulosis   . Hyperlipidemia   . Hypertension   . Hypothyroidism    Dr. Dwyane Dee follows pt.--had small nodules but had decreased in size and stopped Synthroid 06/2013  . Osteoporosis   . Pure hypercholesterolemia   . Severe aortic stenosis   . SVT (supraventricular tachycardia) (HCC)     ALLERGIES:  is allergic to augmentin [amoxicillin-pot clavulanate], azithromycin, bextra [valdecoxib], cefuroxime, cefuroxime axetil, etodolac, humibid la [guaifenesin], lipitor [atorvastatin calcium], ramipril, risedronate sodium, tramadol, zocor [simvastatin], and niaspan [niacin er].  MEDICATIONS:  Current Outpatient Medications  Medication Sig Dispense Refill  . amiodarone (PACERONE) 200 MG tablet TAKE ONE (1) TABLET BY MOUTH EVERY DAY (Patient taking differently: Take 200 mg by mouth daily.) 90 tablet 3  . amLODipine (NORVASC) 2.5 MG tablet Take 1 tablet (2.5 mg total) by mouth daily. (Patient taking differently: Take 2.5 mg by mouth at bedtime.) 30 tablet 11  . amoxicillin (AMOXIL) 500 MG capsule Take 500 mg by mouth as directed. 4 capsules before dental procedures    . BIOTIN PO Take 1 tablet by mouth daily.     . calcium-vitamin D (OSCAL WITH D) 500-200 MG-UNIT tablet Take 1 tablet by mouth daily with breakfast.    . ibuprofen (ADVIL) 200 MG tablet Take 400 mg by mouth every 6 (six) hours as needed for mild pain or headache. For pain    . pindolol (VISKEN) 10 MG tablet Take 1 tablet (10 mg total) by mouth 2 (two) times daily. 60 tablet 6  . rosuvastatin (CRESTOR) 5 MG tablet Take 5 mg by mouth every Monday, Wednesday, and Friday.  Current Facility-Administered Medications  Medication Dose Route Frequency Provider Last Rate Last Admin  . sodium chloride flush (NS) 0.9 % injection 3 mL  3 mL Intravenous Q12H Crenshaw, Denice Bors, MD        SURGICAL HISTORY:  Past Surgical History:  Procedure Laterality Date  . BRONCHIAL BIOPSY  03/01/2021   Procedure: BRONCHIAL BIOPSIES;  Surgeon: Garner Nash, DO;  Location: [facility];  Service: Pulmonary;;  . BRONCHIAL BRUSHINGS  03/01/2021   Procedure: BRONCHIAL BRUSHINGS;  Surgeon: Garner Nash, DO;  Location: [facility];  Service: Pulmonary;;  . BRONCHIAL NEEDLE ASPIRATION BIOPSY  03/01/2021   Procedure: BRONCHIAL NEEDLE ASPIRATION BIOPSIES;  Surgeon: Garner Nash, DO;  Location: [facility];  Service: Pulmonary;;  . BRONCHIAL WASHINGS  03/01/2021   Procedure: BRONCHIAL WASHINGS;  Surgeon: Garner Nash, DO;  Location: [facility];  Service: Pulmonary;;  . CERVICAL BIOPSY  W/ LOOP ELECTRODE EXCISION  1996   CIN I  . COLONOSCOPY  03/2001, 01/2007   2008 - no polyps  . DILATION AND CURETTAGE OF UTERUS    . ESOPHAGOGASTRODUODENOSCOPY (EGD) WITH PROPOFOL N/A 03/02/2021   Procedure: ESOPHAGOGASTRODUODENOSCOPY (EGD) WITH PROPOFOL;  Surgeon: Jerene Bears, MD;  Location: [facility];  Service: Gastroenterology;  Laterality: N/A;  . FOREIGN BODY REMOVAL  03/02/2021   Procedure: FOREIGN BODY REMOVAL;  Surgeon: Jerene Bears, MD;  Location: [facility];  Service: Gastroenterology;;  . LEFT HEART CATH AND CORONARY ANGIOGRAPHY N/A 01/12/2021   Procedure: LEFT HEART CATH AND CORONARY ANGIOGRAPHY;  Surgeon: Sherren Mocha, MD;  Location: [facility];  Service: Cardiovascular;  Laterality: N/A;  . TONSILLECTOMY    . VIDEO BRONCHOSCOPY WITH ENDOBRONCHIAL ULTRASOUND N/A 03/01/2021   Procedure: VIDEO BRONCHOSCOPY WITH ENDOBRONCHIAL ULTRASOUND;  Surgeon: Garner Nash, DO;  Location: [facility];  Service: Pulmonary;  Laterality: N/A;    REVIEW OF SYSTEMS:  Constitutional: positive for fatigue Eyes: negative Ears, nose, mouth, throat, and face: negative Respiratory: negative Cardiovascular: negative Gastrointestinal: negative Genitourinary:negative Integument/breast: negative Hematologic/lymphatic: negative Musculoskeletal:negative Neurological: negative Behavioral/Psych: negative Endocrine:  negative Allergic/Immunologic: negative   PHYSICAL EXAMINATION: General appearance: alert, cooperative, fatigued and no distress Head: Normocephalic, without obvious abnormality, atraumatic Neck: no adenopathy, no JVD, supple, symmetrical, trachea midline and thyroid not enlarged, symmetric, no tenderness/mass/nodules Lymph nodes: Cervical, supraclavicular, and axillary nodes normal. Resp: clear to auscultation bilaterally Back: symmetric, no curvature. ROM normal. No CVA tenderness. Cardio: regular rate and rhythm, S1, S2 normal, no murmur, click, rub or gallop GI: soft, non-tender; bowel sounds normal; no masses,  no organomegaly Extremities: extremities normal, atraumatic, no cyanosis or edema Neurologic: Alert and oriented X 3, normal strength and tone. Normal symmetric reflexes. Normal coordination and gait  ECOG PERFORMANCE STATUS: 1 - Symptomatic but completely ambulatory  Blood pressure (!) 144/62, pulse 63, temperature 97.8 F (36.6 C), temperature source Tympanic, resp. rate 18, height 5\' 5"  (1.651 m), weight 151 lb 12.8 oz (68.9 kg), last menstrual period 11/19/1983, SpO2 97 %.  LABORATORY DATA: Lab Results  Component Value Date   WBC 7.9 04/02/2021   HGB 12.4 04/02/2021   HCT 39.5 04/02/2021   MCV 88.2 04/02/2021   PLT 279 04/02/2021      Chemistry      Component Value Date/Time   NA 139 03/01/2021 1907   NA 140 12/27/2020 1137   K 4.3 03/01/2021 1907   CL 107 03/01/2021 1907   CO2 24 03/01/2021 1907   BUN 14 03/01/2021 1907   BUN 19  12/27/2020 1137   CREATININE 0.85 03/01/2021 1907   CREATININE 0.93 02/22/2021 1433   CREATININE 0.95 (H) 06/24/2016 0835      Component Value Date/Time   CALCIUM 9.0 03/01/2021 1907   ALKPHOS 79 02/22/2021 1433   AST 17 02/22/2021 1433   ALT 19 02/22/2021 1433   BILITOT 0.4 02/22/2021 1433       RADIOGRAPHIC STUDIES: DG Chest 2 View  Result Date: 03/16/2021 CLINICAL DATA:  Lung carcinoma EXAM: CHEST - 2 VIEW  COMPARISON:  Chest radiograph March 03, 2021 and chest CT January 22, 2021 FINDINGS: There is a right paratracheal region mass measuring 4.3 x 2.2 cm, documented previously. There is a small right pleural effusion. Lungs elsewhere clear. No pneumothorax. Heart size and pulmonary vascularity are normal. There is aortic atherosclerosis. No adenopathy. There is lower thoracic dextroscoliosis with degenerative change in the thoracic spine. IMPRESSION: Persistent right paratracheal region mass, similar to prior study. Small right pleural effusion. Lungs elsewhere clear. No pneumothorax. Stable cardiac silhouette. Aortic Atherosclerosis (ICD10-I70.0). Electronically Signed   By: Lowella Grip III M.D.   On: 03/16/2021 15:10   US BREAST LTD UNI LEFT INC AXILLA  Result Date: 03/29/2021 CLINICAL DATA:  87 year old female with a hypermetabolic focus along the 12 o'clock axis of the breast breast on recent PET-CT. EXAM: DIGITAL DIAGNOSTIC BILATERAL MAMMOGRAM WITH TOMOSYNTHESIS AND CAD; ULTRASOUND LEFT BREAST LIMITED TECHNIQUE: Bilateral digital diagnostic mammography and breast tomosynthesis was performed. The images were evaluated with computer-aided detection.; Targeted ultrasound examination of the left breast was performed COMPARISON:  Previous exam(s). ACR Breast Density Category c: The breast tissue is heterogeneously dense, which may obscure small masses. FINDINGS: There is a persistent focal asymmetry along the 12 o'clock axis of the left breast at middle depth. Associated punctate calcifications are mammographically stable dating back to 2019. No additional suspicious mammographic findings in either breast. The parenchymal pattern is otherwise stable. Targeted ultrasound is performed, showing 2 adjacent oval, circumscribed hypoechoic masses along the 12 o'clock position 3 cm from the nipple. They measure 1.4 x 1.0 x 0.6 cm and 0.9 x 0.7 x 0.5 cm. Evaluation of the left axilla demonstrates no suspicious  lymphadenopathy. IMPRESSION: 1. Two adjacent, indeterminate left breast masses at the 12 o'clock position 3 cm from the nipple corresponding with the location of the hypermetabolic activity noted on recent PET-CT scan. Recommendation is for ultrasound-guided biopsy. 2. No suspicious left axillary lymphadenopathy. 3. No mammographic evidence of malignancy on the right. RECOMMENDATION: Ultrasound-guided biopsy of the left breast. I have discussed the findings and recommendations with the patient. If applicable, a reminder letter will be sent to the patient regarding the next appointment. BI-RADS CATEGORY  4: Suspicious. Electronically Signed   By: Kristopher Oppenheim M.D.   On: 03/29/2021 10:43   MM DIAG BREAST TOMO BILATERAL  Result Date: 03/29/2021 CLINICAL DATA:  87 year old female with a hypermetabolic focus along the 12 o'clock axis of the breast breast on recent PET-CT. EXAM: DIGITAL DIAGNOSTIC BILATERAL MAMMOGRAM WITH TOMOSYNTHESIS AND CAD; ULTRASOUND LEFT BREAST LIMITED TECHNIQUE: Bilateral digital diagnostic mammography and breast tomosynthesis was performed. The images were evaluated with computer-aided detection.; Targeted ultrasound examination of the left breast was performed COMPARISON:  Previous exam(s). ACR Breast Density Category c: The breast tissue is heterogeneously dense, which may obscure small masses. FINDINGS: There is a persistent focal asymmetry along the 12 o'clock axis of the left breast at middle depth. Associated punctate calcifications are mammographically stable dating back to 2019. No additional suspicious mammographic findings  in either breast. The parenchymal pattern is otherwise stable. Targeted ultrasound is performed, showing 2 adjacent oval, circumscribed hypoechoic masses along the 12 o'clock position 3 cm from the nipple. They measure 1.4 x 1.0 x 0.6 cm and 0.9 x 0.7 x 0.5 cm. Evaluation of the left axilla demonstrates no suspicious lymphadenopathy. IMPRESSION: 1. Two adjacent,  indeterminate left breast masses at the 12 o'clock position 3 cm from the nipple corresponding with the location of the hypermetabolic activity noted on recent PET-CT scan. Recommendation is for ultrasound-guided biopsy. 2. No suspicious left axillary lymphadenopathy. 3. No mammographic evidence of malignancy on the right. RECOMMENDATION: Ultrasound-guided biopsy of the left breast. I have discussed the findings and recommendations with the patient. If applicable, a reminder letter will be sent to the patient regarding the next appointment. BI-RADS CATEGORY  4: Suspicious. Electronically Signed   By: Kristopher Oppenheim M.D.   On: 03/29/2021 10:43    ASSESSMENT AND PLAN: This is a very pleasant 87 years old white female diagnosed with a stage IV (T3, N2, M1 C) non-small cell lung cancer probably adenocarcinoma in a patient with remote smoking history diagnosed in April 2022 and presented with large right lower lobe lung mass in addition to right hilar and mediastinal lymphadenopathy as well as bilateral adrenal metastasis.  The patient also has a suspicious hypermetabolic lesion in the left breast that currently undergoing evaluation at the breast center. Unfortunately the biopsy has insufficient material for molecular studies. I recommended for the patient to consider blood test sent to [facility] for molecular studies to identify any actionable mutation. If the patient has no actionable mutation, I may consider her for treatment with a combination of systemic chemotherapy with carboplatin, Alimta and Keytruda every 3 weeks versus palliative care. The patient and her son are in agreement with the current plan. I will see her back for follow-up visit in around 2 weeks for more detailed discussion of her treatment options based on the molecular study as well as the biopsy from the breast lesion. The patient was advised to call immediately if she has any other concerning symptoms in the interval. The patient  voices understanding of current disease status and treatment options and is in agreement with the current care plan.  All questions were answered. The patient knows to call the clinic with any problems, questions or concerns. We can certainly see the patient much sooner if necessary. The total time spent in the appointment was 34 minutes.  Disclaimer: This note was dictated with voice recognition software. Similar sounding words can inadvertently be transcribed and may not be corrected upon review.

## 2021-04-03 ENCOUNTER — Other Ambulatory Visit: Payer: Self-pay

## 2021-04-03 ENCOUNTER — Ambulatory Visit
Admission: RE | Admit: 2021-04-03 | Discharge: 2021-04-03 | Disposition: A | Discharge status: Home / Self Care | Payer: Medicare PPO | Source: Ambulatory Visit | Attending: Internal Medicine | Admitting: Internal Medicine

## 2021-04-03 ENCOUNTER — Other Ambulatory Visit: Payer: Medicare PPO

## 2021-04-03 DIAGNOSIS — N63 Unspecified lump in unspecified breast: Secondary | ICD-10-CM

## 2021-04-03 DIAGNOSIS — R918 Other nonspecific abnormal finding of lung field: Secondary | ICD-10-CM

## 2021-04-03 LAB — RESEARCH LABS

## 2021-04-04 ENCOUNTER — Encounter: Payer: Self-pay | Admitting: *Deleted

## 2021-04-04 NOTE — Progress Notes (Signed)
Per Dr. Julien Nordmann, I contacted breast navigator to update her that patient needs to be seen with surgery for breast cancer.  I updated Bary Castilla RN

## 2021-04-05 ENCOUNTER — Telehealth: Payer: Self-pay | Admitting: Internal Medicine

## 2021-04-05 NOTE — Telephone Encounter (Signed)
Scheduled per los. Called and spoke with patient. Confirmed appt 

## 2021-04-11 LAB — GUARDANT 360

## 2021-04-13 ENCOUNTER — Ambulatory Visit: Payer: Self-pay | Admitting: Surgery

## 2021-04-13 DIAGNOSIS — C50912 Malignant neoplasm of unspecified site of left female breast: Secondary | ICD-10-CM

## 2021-04-13 NOTE — H&P (Signed)
History of Present Illness Kim Ward. Kim Cafaro MD; 04/13/2021 1:21 PM) The patient is a 87 year old female who presents with breast cancer. Oncology - [facility]amed PCP - Francine Graven, NP Cardiology - [facility]  This is an 87 year old female with aortic stenosis and hypertension who was recently undergoing workup for TAVR. She was found to have a mass in the right upper lobe measuring 4.7 x 3.7 x 4.5 cm. PET/CT showed a hypermetabolic focus in the left breast. The patient has not had a mammogram in over 5 years. She does not have any pain or palpable masses in her left breast. She underwent diagnostic mammogram and ultrasound. At 12:00 in the left breast, 3 cm from the nipple, there is a lobulated mass measuring 1.8 cm in diameter. This area was biopsied and revealed invasive ductal carcinoma, ER/PR positive. Ultrasound of the axilla was negative. Mammogram of the right breast was also negative. Further chest are pending on her lung cancer. A definitive treatment plan has not yet been decided.  She is accompanied by her son who will up from [address] for this appointment. She also has a daughter who works in the room.  The patient had a daughter who was diagnosed with breast cancer at age 18 and subsequently passed away at age 13 for metastatic breast cancer.  The patient lives in a retirement community but lives in her own apartment. She still drives her own car.   Problem List/Past Medical Rodman Key K. Pasqualino Witherspoon, MD; 04/13/2021 1:21 PM) DUCTAL CARCINOMA OF LEFT BREAST, STAGE 1 (I96.789)  Past Surgical History Kim Lorenzo, LPN; 3/81/0175 10:25 AM) Breast Mass; Local Excision Left. Cataract Surgery Left. Foot Surgery Left. Oral Surgery Tonsillectomy  Diagnostic Studies History Kim Lorenzo, LPN; 8/52/7782 42:35 AM) Colonoscopy 5-10 years ago Mammogram within last year Pap Smear 1-5 years ago  Allergies Jess Barters, CMA; 04/13/2021 11:07 AM) Augmentin  *PENICILLINS* Azithromycin *CHEMICALS* Bextra *ANALGESICS - ANTI-INFLAMMATORY* Cefuroxime Axetil *CEPHALOSPORINS* Etodolac *ANALGESICS - ANTI-INFLAMMATORY* Humibid *COUGH/COLD/ALLERGY* Lipitor *ANTIHYPERLIPIDEMICS* Allergies Reconciled  Medication History Jess Barters, CMA; 04/13/2021 11:08 AM) Amiodarone HCl (200MG  Tablet, Oral) Active. Losartan Potassium (50MG  Tablet, Oral) Active. Rosuvastatin Calcium (5MG  Tablet, Oral) Active. Pindolol (5MG  Tablet, Oral) Active. Pindolol (10MG  Tablet, Oral) Active. Bisoprolol Fumarate (5MG  Tablet, Oral) Active. Medications Reconciled  Social History Kim Lorenzo, LPN; 3/61/4431 54:00 AM) Alcohol use Occasional alcohol use. No caffeine use No drug use Tobacco use Former smoker.  Family History Kim Lorenzo, LPN; 8/67/6195 09:32 AM) Arthritis Mother. Breast Cancer Daughter. Diabetes Mellitus Daughter. Heart Disease Brother. Heart disease in female family member before age 101 Hypertension Brother. Thyroid problems Daughter.  Pregnancy / Birth History Kim Lorenzo, LPN; 6/71/2458 09:98 AM) Age at menarche 31 years. Age of menopause 51-55 Contraceptive History Contraceptive implant, Oral contraceptives. Gravida 5 Maternal age 4-25 Para 3  Other Problems Kim Ward. Kim Hetzer, MD; 04/13/2021 1:21 PM) Arthritis Back Pain Breast Cancer Hypercholesterolemia Migraine Headache Thyroid Disease Vascular Disease     Review of Systems [name] [employer] LPN; 3/38/2505 39:76 AM) General Present- Fatigue and Weight Loss. Not Present- Appetite Loss, Chills, Fever, Night Sweats and Weight Gain. Skin Not Present- Change in Wart/Mole, Dryness, Hives, Jaundice, New Lesions, Non-Healing Wounds, Rash and Ulcer. HEENT Present- Seasonal Allergies, Visual Disturbances and Wears glasses/contact lenses. Not Present- Earache, Hearing Loss, Hoarseness, Nose Bleed, Oral Ulcers, Ringing in the Ears, Sinus Pain, Sore  Throat and Yellow Eyes. Respiratory Not Present- Bloody sputum, Chronic Cough, Difficulty Breathing, Snoring and Wheezing. Breast Not Present- Breast Mass, Breast Pain, Nipple  Discharge and Skin Changes. Cardiovascular Present- Leg Cramps, Palpitations and Rapid Heart Rate. Not Present- Chest Pain, Difficulty Breathing Lying Down, Shortness of Breath and Swelling of Extremities. Gastrointestinal Present- Constipation, Difficulty Swallowing, Gets full quickly at meals, Nausea and Vomiting. Not Present- Abdominal Pain, Bloating, Bloody Stool, Change in Bowel Habits, Chronic diarrhea, Excessive gas, Hemorrhoids, Indigestion and Rectal Pain. Female Genitourinary Not Present- Frequency, Nocturia, Painful Urination, Pelvic Pain and Urgency. Musculoskeletal Present- Joint Pain, Joint Stiffness and Swelling of Extremities. Not Present- Back Pain, Muscle Pain and Muscle Weakness. Neurological Present- Decreased Memory. Not Present- Fainting, Headaches, Numbness, Seizures, Tingling, Tremor, Trouble walking and Weakness. Psychiatric Not Present- Anxiety, Bipolar, Change in Sleep Pattern, Depression, Fearful and Frequent crying. Endocrine Not Present- Cold Intolerance, Excessive Hunger, Hair Changes, Heat Intolerance, Hot flashes and New Diabetes. Hematology Present- Easy Bruising. Not Present- Blood Thinners, Excessive bleeding, Gland problems, HIV and Persistent Infections.  Vitals Jess Barters CMA; 04/13/2021 11:08 AM) 04/13/2021 11:08 AM Weight: 153.25 lb Temp.: 98.82F  Pulse: 61 (Regular)  P.OX: 94% (Room air) BP: 138/64(Sitting, Left Arm, Standard)        Physical Exam Rodman Key K. Dwight Adamczak MD; 04/13/2021 1:22 PM)  The physical exam findings are as follows: Note:Constitutional: WDWN in NAD, conversant, no obvious deformities; resting comfortably Eyes: Pupils equal, round; sclera anicteric; moist conjunctiva; no lid lag HENT: Oral mucosa moist; good dentition Neck: No masses palpated, trachea  midline; no thyromegaly Lungs: CTA bilaterally; normal respiratory effort Breasts: Symmetrical, no nipple retraction or discharge, no axillary lymphadenopathy on either side, no right breast masses. The left breast at 12:00 there is some fullness palpable deep within the breast. This covers an area about 2 cm across. There are no overlying skin changes. CV: Regular rate and rhythm; no murmurs; extremities well-perfused with no edema Abd: +bowel sounds, soft, non-tender, no palpable organomegaly; no palpable hernias Musc: Normal gait; no apparent clubbing or cyanosis in extremities Lymphatic: No palpable cervical or axillary lymphadenopathy Skin: Warm, dry; no sign of jaundice Psychiatric - alert and oriented x 4; calm mood and affect    Assessment & Plan Rodman Key K. Jiya Kissinger MD; 04/13/2021 1:23 PM)  DUCTAL CARCINOMA OF LEFT BREAST, STAGE 1 (C50.912)  Current Plans Schedule for Surgery - Left radioactive seed localized lumpectomy. The surgical procedure has been discussed with the patient. Potential risks, benefits, alternative treatments, and expected outcomes have been explained. All of the patient's questions at this time have been answered. The likelihood of reaching the patient's treatment goal is good. The patient understand the proposed surgical procedure and wishes to proceed. Note:Her surgical recommendation is for a lumpectomy with clear margins. She is not really a candidate for systemic chemotherapy so we will not pursue lymph node biopsy. We will contact her cardiologist for clearance prior to scheduling her for surgery. Usually this procedure is performed under general anesthesia, although paralysis is not needed. Due to her comorbidities, we will likely keep her overnight for observation.  If we can get clear margins with a lumpectomy, she will likely be treated with adjuvant antiestrogen's with or without radiation therapy.  Kim Ward. Georgette Dover, MD, [facility]  Surgery  General/ Trauma Surgery   04/13/2021 1:23 PM

## 2021-04-15 NOTE — Progress Notes (Signed)
[facility] OFFICE PROGRESS NOTE  Francine Graven D, NP [address] [place] [address]  DIAGNOSIS:  1) Stage IV (T3, N2, M1 C) non-small cell lung carcinoma and probably adenocarcinoma in this patient with remote history of smoking diagnosed in April 2022. She presented with large right lower lobe lung mass in addition to right hilar and mediastinal lymphadenopathy as well as bilateral adrenal metastasis. Diagnosed in April 2022.  2) Stage I Breast Cancer, ER/PR positive. Diagnosed in April 2022.   DETECTED ALTERATION(S) / BIOMARKER(S) % CFDNA OR AMPLIFICATION ASSOCIATED FDA-APPROVED THERAPIES CLINICAL TRIAL AVAILABILITY [id number]* 0.3%  Olaparib, Rucaparib, Talazoparib Yes CDK12Splice Site SNV 4.2%  Olaparib Yes [id number] 0.3% None  Yes  PRIOR THERAPY: None  CURRENT THERAPY: Palliative systemic chemotherapy with carboplatin for an AUC of 5, Alimta 500 mg per metered squared, Keytruda 200 mg IV every 3 weeks.  First dose expected on 04/26/2021.   INTERVAL HISTORY: NCHARIDY CAPPELLETTI87y.o. female returns to the clinic today for a follow-up visit accompanied by her daughter.  The patient was recently diagnosed with lung cancer as well as breast cancer.  Regarding her breast cancer, she recently met with her surgeon Dr.Tsuei on 04/13/2021.  He discussed once getting clearance, the plan would likely be to proceed with a left radioactive seed localized lumpectomy which has not tentatively been scheduled yet.  He discussed that if he can get clear margins with the lumpectomy, she will likely be treated with adjuvant antiestrogens with or without radiation therapy.  Regarding her lung cancer, the patient has metastatic disease.  In order to determine what treatment she is the best candidate for, the patient required molecular studies to be performed, epecially considering she has a remote history of smoking and quit in around 1974. There was not enough tissue from her  initial biopsy for foundation 1 testing, therefore, the patient had a liquid biopsy with guardant 360 in which she did not have any actionable mutations but she was found to be BRCA1 positive.  Of note, the patient had a daughter who passed away due to breast cancer at a young age and was also BRCA1 positive.  Otherwise, the patient denies any new concerning complaints today. She denies any chest pain, shortness of breath, cough, or hemoptysis.  She denies any fever, chills, or night sweats.   She denies any nausea, vomiting, diarrhea, or constipation.  She reports some firm stools but has a bowel movement daily. She denies any headache or visual changes except she has been having some ongoing issues with double vision since December 2021.   Of note, the patient's brain MRI performed in April 2022 did not show any evidence of metastatic disease to the brain.  She is supposed to follow-up with her ophthalmologist later this month. The patient is here today for evaluation and a more detailed discussion about her current condition and recommended treatment options.   MEDICAL HISTORY: Past Medical History:  Diagnosis Date  . Adenomatous colon polyp   . Depression    Tx'd 19 yrs ago when husband passed away  . Diverticulosis   . Hyperlipidemia   . Hypertension   . Hypothyroidism    Dr. KDwyane Deefollows pt.--had small nodules but had decreased in size and stopped Synthroid 06/2013  . Osteoporosis   . Pure hypercholesterolemia   . Severe aortic stenosis   . SVT (supraventricular tachycardia) (HCC)     ALLERGIES:  is allergic to augmentin [amoxicillin-pot clavulanate], azithromycin, bextra [valdecoxib], cefuroxime,  cefuroxime axetil, etodolac, humibid la [guaifenesin], lipitor [atorvastatin calcium], ramipril, risedronate sodium, tramadol, zocor [simvastatin], and niaspan [niacin er].  MEDICATIONS:  Current Outpatient Medications  Medication Sig Dispense Refill  . amiodarone (PACERONE) 200 MG tablet  TAKE ONE (1) TABLET BY MOUTH EVERY DAY (Patient taking differently: Take 200 mg by mouth daily.) 90 tablet 3  . amLODipine (NORVASC) 2.5 MG tablet Take 1 tablet (2.5 mg total) by mouth daily. (Patient taking differently: Take 2.5 mg by mouth at bedtime.) 30 tablet 11  . BIOTIN PO Take 1 tablet by mouth daily.     . calcium-vitamin D (OSCAL WITH D) 500-200 MG-UNIT tablet Take 1 tablet by mouth daily with breakfast.    . folic acid (FOLVITE) 1 MG tablet Take 1 tablet (1 mg total) by mouth daily. 30 tablet 2  . ibuprofen (ADVIL) 200 MG tablet Take 400 mg by mouth every 6 (six) hours as needed for mild pain or headache. For pain    . lidocaine-prilocaine (EMLA) cream Apply 1 application topically as needed. 30 g 2  . pindolol (VISKEN) 10 MG tablet Take 1 tablet (10 mg total) by mouth 2 (two) times daily. 60 tablet 6  . prochlorperazine (COMPAZINE) 10 MG tablet Take 1 tablet (10 mg total) by mouth every 6 (six) hours as needed. 30 tablet 2  . rosuvastatin (CRESTOR) 5 MG tablet Take 5 mg by mouth every Monday, Wednesday, and Friday.    [name] amoxicillin (AMOXIL) 500 MG capsule Take 500 mg by mouth as directed. 4 capsules before dental procedures (Patient not taking: Reported on 04/19/2021)     Current Facility-Administered Medications  Medication Dose Route Frequency Provider Last Rate Last Admin  . sodium chloride flush (NS) 0.9 % injection 3 mL  3 mL Intravenous Q12H Crenshaw, Denice Bors, MD        SURGICAL HISTORY:  Past Surgical History:  Procedure Laterality Date  . BRONCHIAL BIOPSY  03/01/2021   Procedure: BRONCHIAL BIOPSIES;  Surgeon: Garner Nash, DO;  Location: [facility];  Service: Pulmonary;;  . BRONCHIAL BRUSHINGS  03/01/2021   Procedure: BRONCHIAL BRUSHINGS;  Surgeon: Garner Nash, DO;  Location: [facility];  Service: Pulmonary;;  . BRONCHIAL NEEDLE ASPIRATION BIOPSY  03/01/2021   Procedure: BRONCHIAL NEEDLE ASPIRATION BIOPSIES;  Surgeon: Garner Nash, DO;  Location: [facility];   Service: Pulmonary;;  . BRONCHIAL WASHINGS  03/01/2021   Procedure: BRONCHIAL WASHINGS;  Surgeon: Garner Nash, DO;  Location: [facility];  Service: Pulmonary;;  . CERVICAL BIOPSY  W/ LOOP ELECTRODE EXCISION  1996   CIN I  . COLONOSCOPY  03/2001, 01/2007   2008 - no polyps  . DILATION AND CURETTAGE OF UTERUS    . ESOPHAGOGASTRODUODENOSCOPY (EGD) WITH PROPOFOL N/A 03/02/2021   Procedure: ESOPHAGOGASTRODUODENOSCOPY (EGD) WITH PROPOFOL;  Surgeon: Jerene Bears, MD;  Location: [facility];  Service: Gastroenterology;  Laterality: N/A;  . FOREIGN BODY REMOVAL  03/02/2021   Procedure: FOREIGN BODY REMOVAL;  Surgeon: Jerene Bears, MD;  Location: [facility];  Service: Gastroenterology;;  . LEFT HEART CATH AND CORONARY ANGIOGRAPHY N/A 01/12/2021   Procedure: LEFT HEART CATH AND CORONARY ANGIOGRAPHY;  Surgeon: Sherren Mocha, MD;  Location: [facility];  Service: Cardiovascular;  Laterality: N/A;  . TONSILLECTOMY    . VIDEO BRONCHOSCOPY WITH ENDOBRONCHIAL ULTRASOUND N/A 03/01/2021   Procedure: VIDEO BRONCHOSCOPY WITH ENDOBRONCHIAL ULTRASOUND;  Surgeon: Garner Nash, DO;  Location: [facility];  Service: Pulmonary;  Laterality: N/A;    REVIEW OF SYSTEMS:  Review of Systems  Constitutional: Negative for appetite change, chills, fatigue, fever and unexpected weight change.  HENT: Negative for mouth sores, nosebleeds, sore throat and trouble swallowing.   Eyes: Negative for eye problems and icterus.  Respiratory: Negative for cough, hemoptysis, shortness of breath and wheezing.   Cardiovascular: Negative for chest pain and leg swelling.  Gastrointestinal: Negative for abdominal pain, constipation, diarrhea, nausea and vomiting.  Genitourinary: Negative for bladder incontinence, difficulty urinating, dysuria, frequency and hematuria.   Musculoskeletal: Negative for back pain, gait problem, neck pain and neck stiffness.  Skin: Negative for itching and rash.  Neurological: Negative  for dizziness, extremity weakness, gait problem, headaches, light-headedness and seizures.  Hematological: Negative for adenopathy. Does not bruise/bleed easily.  Psychiatric/Behavioral: Negative for confusion, depression and sleep disturbance. The patient is not nervous/anxious.     PHYSICAL EXAMINATION:  Blood pressure (!) 141/47, pulse 61, temperature (!) 97.5 F (36.4 C), temperature source Tympanic, resp. rate 18, height '5\' 5"'  (1.651 m), weight 155 lb 6.4 oz (70.5 kg), last menstrual period 11/19/1983, SpO2 98 %.  ECOG PERFORMANCE STATUS: 1 - Symptomatic but completely ambulatory  Physical Exam  Constitutional: Oriented to person, place, and time and thin appearing female and appears younger than stated age and in no distress.  HENT:  Head: Normocephalic and atraumatic.  Mouth/Throat: Oropharynx is clear and moist. No oropharyngeal exudate.  Eyes: Conjunctivae are normal. Right eye exhibits no discharge. Left eye exhibits no discharge. No scleral icterus.  Neck: Normal range of motion. Neck supple.  Cardiovascular: Normal rate, regular rhythm, normal heart sounds and intact distal pulses.   Pulmonary/Chest: Effort normal and breath sounds normal. No respiratory distress. No wheezes. No rales.  Abdominal: Soft. Bowel sounds are normal. Exhibits no distension and no mass. There is no tenderness.  Musculoskeletal: Normal range of motion. Exhibits no edema.  Lymphadenopathy:    No cervical adenopathy.  Neurological: Alert and oriented to person, place, and time. Exhibits muscle wasting. Gait normal. Coordination normal.  Skin: Skin is warm and dry. No rash noted. Not diaphoretic. No erythema. No pallor.  Psychiatric: Mood, memory and judgment normal.  Vitals reviewed.  LABORATORY DATA: Lab Results  Component Value Date   WBC 7.6 04/19/2021   HGB 11.9 (L) 04/19/2021   HCT 37.1 04/19/2021   MCV 87.1 04/19/2021   PLT 263 04/19/2021      Chemistry      Component Value Date/Time    NA 141 04/19/2021 0954   NA 140 12/27/2020 1137   K 4.1 04/19/2021 0954   CL 107 04/19/2021 0954   CO2 25 04/19/2021 0954   BUN 16 04/19/2021 0954   BUN 19 12/27/2020 1137   CREATININE 0.88 04/19/2021 0954   CREATININE 0.95 (H) 06/24/2016 0835      Component Value Date/Time   CALCIUM 9.3 04/19/2021 0954   ALKPHOS 89 04/19/2021 0954   AST 18 04/19/2021 0954   ALT 16 04/19/2021 0954   BILITOT 0.4 04/19/2021 0954       RADIOGRAPHIC STUDIES:  US BREAST LTD UNI LEFT INC AXILLA  Result Date: 03/29/2021 CLINICAL DATA:  87 year old female with a hypermetabolic focus along the 12 o'clock axis of the breast breast on recent PET-CT. EXAM: DIGITAL DIAGNOSTIC BILATERAL MAMMOGRAM WITH TOMOSYNTHESIS AND CAD; ULTRASOUND LEFT BREAST LIMITED TECHNIQUE: Bilateral digital diagnostic mammography and breast tomosynthesis was performed. The images were evaluated with computer-aided detection.; Targeted ultrasound examination of the left breast was performed COMPARISON:  Previous exam(s). ACR Breast Density Category c: The  breast tissue is heterogeneously dense, which may obscure small masses. FINDINGS: There is a persistent focal asymmetry along the 12 o'clock axis of the left breast at middle depth. Associated punctate calcifications are mammographically stable dating back to 2019. No additional suspicious mammographic findings in either breast. The parenchymal pattern is otherwise stable. Targeted ultrasound is performed, showing 2 adjacent oval, circumscribed hypoechoic masses along the 12 o'clock position 3 cm from the nipple. They measure 1.4 x 1.0 x 0.6 cm and 0.9 x 0.7 x 0.5 cm. Evaluation of the left axilla demonstrates no suspicious lymphadenopathy. IMPRESSION: 1. Two adjacent, indeterminate left breast masses at the 12 o'clock position 3 cm from the nipple corresponding with the location of the hypermetabolic activity noted on recent PET-CT scan. Recommendation is for ultrasound-guided biopsy. 2. No  suspicious left axillary lymphadenopathy. 3. No mammographic evidence of malignancy on the right. RECOMMENDATION: Ultrasound-guided biopsy of the left breast. I have discussed the findings and recommendations with the patient. If applicable, a reminder letter will be sent to the patient regarding the next appointment. BI-RADS CATEGORY  4: Suspicious. Electronically Signed   By: Kristopher Oppenheim M.D.   On: 03/29/2021 10:43   MM DIAG BREAST TOMO BILATERAL  Result Date: 03/29/2021 CLINICAL DATA:  87 year old female with a hypermetabolic focus along the 12 o'clock axis of the breast breast on recent PET-CT. EXAM: DIGITAL DIAGNOSTIC BILATERAL MAMMOGRAM WITH TOMOSYNTHESIS AND CAD; ULTRASOUND LEFT BREAST LIMITED TECHNIQUE: Bilateral digital diagnostic mammography and breast tomosynthesis was performed. The images were evaluated with computer-aided detection.; Targeted ultrasound examination of the left breast was performed COMPARISON:  Previous exam(s). ACR Breast Density Category c: The breast tissue is heterogeneously dense, which may obscure small masses. FINDINGS: There is a persistent focal asymmetry along the 12 o'clock axis of the left breast at middle depth. Associated punctate calcifications are mammographically stable dating back to 2019. No additional suspicious mammographic findings in either breast. The parenchymal pattern is otherwise stable. Targeted ultrasound is performed, showing 2 adjacent oval, circumscribed hypoechoic masses along the 12 o'clock position 3 cm from the nipple. They measure 1.4 x 1.0 x 0.6 cm and 0.9 x 0.7 x 0.5 cm. Evaluation of the left axilla demonstrates no suspicious lymphadenopathy. IMPRESSION: 1. Two adjacent, indeterminate left breast masses at the 12 o'clock position 3 cm from the nipple corresponding with the location of the hypermetabolic activity noted on recent PET-CT scan. Recommendation is for ultrasound-guided biopsy. 2. No suspicious left axillary lymphadenopathy. 3.  No mammographic evidence of malignancy on the right. RECOMMENDATION: Ultrasound-guided biopsy of the left breast. I have discussed the findings and recommendations with the patient. If applicable, a reminder letter will be sent to the patient regarding the next appointment. BI-RADS CATEGORY  4: Suspicious. Electronically Signed   By: Kristopher Oppenheim M.D.   On: 03/29/2021 10:43   MM CLIP PLACEMENT LEFT  Result Date: 04/03/2021 CLINICAL DATA:  Confirmation of clip placement after ultrasound-guided core needle biopsy of a mass in the UPPER LEFT breast at the near 12 o'clock location. EXAM: 2D AND 3D DIAGNOSTIC LEFT MAMMOGRAM POST ULTRASOUND BIOPSY COMPARISON:  Previous exam(s). FINDINGS: Tomosynthesis and synthesized full field CC and mediolateral mammographic images were obtained following ultrasound guided biopsy of a mass in the upper LEFT breast. The ribbon shaped biopsy marking clip is appropriately position within the biopsied mass. Expected post biopsy changes are present without evidence of hematoma. IMPRESSION: Appropriate positioning of the ribbon shaped biopsy marking clip within the biopsied mass in the upper LEFT breast  at the near 12 o'clock location. Final Assessment: Post Procedure Mammograms for Marker Placement Electronically Signed   By: Evangeline Dakin M.D.   On: 04/03/2021 14:07   [place] LT BREAST BX W LOC DEV 1ST LESION IMG BX SPEC US GUIDE  Addendum Date: 04/05/2021   ADDENDUM REPORT: 04/05/2021 10:58 ADDENDUM: Pathology revealed DUCTAL CARCINOMA of the LEFT breast, upper, 3cmfn, ribbon clip. The differential diagnosis includes ductal carcinoma involving a papillary lesion and papillary carcinoma. This was found to be concordant by Dr. Peggye Fothergill. Pathology results were discussed with the patient by telephone. The patient reported doing well after the biopsy with tenderness at the site. Post biopsy instructions and care were reviewed and questions were answered. The patient was encouraged  to call [facility] for any additional concerns. At the request of Dr. Curt Bears, surgical evaluation has been arranged with Dr. Donnie Mesa at [facility] on Apr 13, 2021. The patient has a recent diagnosis of non-small cell lung cancer and should follow her outlined treatment plan. Pathology results reported by Stacie Acres RN on 04/05/2021. Electronically Signed   By: Evangeline Dakin M.D.   On: 04/05/2021 10:58   Result Date: 04/05/2021 CLINICAL DATA:  87 year old with a recent diagnosis of non-small cell lung carcinoma involving the RIGHT UPPER LOBE, shown on PET-CT to have a hypermetabolic focus in the UPPER LEFT breast. Diagnostic workup demonstrated a lobulated mass at the 12 o'clock position approximately 3 cm from the nipple that measured approximately 1.5 cm. EXAM: ULTRASOUND GUIDED LEFT BREAST CORE NEEDLE BIOPSY COMPARISON:  Previous exam(s). PROCEDURE: I met with the patient and we discussed the procedure of ultrasound-guided biopsy, including benefits and alternatives. We discussed the high likelihood of a successful procedure. We discussed the risks of the procedure, including infection, bleeding, tissue injury, clip migration, and inadequate sampling. Informed written consent was given. The usual time-out protocol was performed immediately prior to the procedure. Lesion quadrant: UPPER breast, near 12 o'clock location. Initial ultrasound evaluation confirmed that the mass in the upper breast is a single lobulated mass rather than 2 adjacent masses as had been questioned on the diagnostic ultrasound. Using sterile technique with chlorhexidine as skin antisepsis, 1% lidocaine and 1% lidocaine with epinephrine as local anesthetic, under direct ultrasound visualization, a 12 gauge Bard Marquee core needle device placed through needle was used to perform biopsy of the mass in the upper LEFT breast using a lateral approach. At the conclusion of the procedure  a ribbon shaped tissue marker clip was deployed into the biopsy cavity. Follow up 2 view mammogram was performed and dictated separately. IMPRESSION: Ultrasound guided biopsy of an indeterminate mass involving the upper LEFT breast. No apparent complications. Electronically Signed: By: Evangeline Dakin M.D. On: 04/03/2021 14:09     ASSESSMENT/PLAN:  This is a very pleasant 87 year old Caucasian female with: 1) Stage IV (T3, N2, M1 C) non-small cell lung carcinoma and probably adenocarcinoma in this patient with remote history of smoking diagnosed in April 2022. She presented with large right lower lobe lung mass in addition to right hilar and mediastinal lymphadenopathy as well as bilateral adrenal metastasis. Diagnosed in April 2022.  2) Stage I Breast Cancer, ER/PR positive. Diagnosed in April 2022.   Regarding her breast cancer, the patient met with Dr.Tsuei who discussed treatment would likely include a left radioactive seed localized lumpectomy. If the margins are clear after the lumpectomy, she will likely be treated with adjuvant antiestrogen's with or without radiation  therapy.  Regarding her breast cancer, the patient did not have any actionable mutations; however, she is BRCA1 positive.  We will refer her to genetic counseling regarding this.   Dr. Julien Nordmann had a lengthly discussion with the patient today about her current condition and treatment options regarding her lung cancer.  Given that the patient's lung cancer is stage IV and her breast cancer stage I, Dr. Julien Nordmann give the patient 2 options.  The first option would be that if the patient can undergo lumpectomy in the next 1 to 2 weeks, we can delay systemic palliative chemotherapy for her lung cancer to start approximately 2 weeks after her lumpectomy.    The second option would be that if there is a delay in undergoing a lumpectomy , then we can arrange for the patient to undergo hormonal therapy for the breast cancer and start  systemic chemotherapy with carboplatin, Alimta, and Keytruda.    Considering her lung cancer stage IV, Dr. Julien Nordmann does not recommend delaying starting her systemic chemotherapy.    The patient has opted to start her systemic chemotherapy for her lung cancer next week and is expected to receive her first dose of treatment on 04/26/2021.  The patient will begin maintenance treatment after approximately 12-15 weeks of treatment.  When the patient begins maintenance therapy, then we can consider having the patient follow-up with her surgeon regarding the lumpectomy.  If needed, we can hold her treatment for a few weeks so she can undergo surgery.  The patient is in agreement with this plan.  We will fax our notes/recommendations to her surgeon.    We discussed the adverse side effects of treatment including but not limited to alopecia, myelosuppression, nausea and vomiting, peripheral neuropathy, liver or renal dysfunction as well as immunotherapy mediated adverse effects.   I will arrange for the patient to have a chemoeducation class prior to receiving her first cycle of chemotherapy.   We will arrange for the patient to have a B12 injection while in the clinic today.     I sent prescriptions for 1 mg folic acid p.o. daily as well as Compazine 10 mg every 6 hours as needed for nausea.   The patient will follow-up in 2 weeks for a one-week follow-up visit after completing her first cycle of chemotherapy.  The patient is interested in having a Port-A-Cath placed.  I have placed a referral.  I also discussed how to use the Emla cream.  We will send in a prescription for anastrozole 1 mg p.o. daily to the patient's pharmacy for her hormone positive breast cancer  The patient was advised to call immediately if she has any concerning symptoms in the interval. The patient voices understanding of current disease status and treatment options and is in agreement with the current care plan. All questions  were answered. The patient knows to call the clinic with any problems, questions or concerns. We can certainly see the patient much sooner if necessary   Orders Placed This Encounter  Procedures  . IR IMAGING GUIDED PORT INSERTION    Standing Status:   Future    Standing Expiration Date:   04/19/2022    Order Specific Question:   Reason for Exam (SYMPTOM  OR DIAGNOSIS REQUIRED)    Answer:   Chemotherapy. Starting 04/24/21. Please try to insert before first infusion.    Order Specific Question:   Preferred Imaging Location?    Answer:   [facility]  . CBC with Differential (Cancer  Center Only)    Standing Status:   Standing    Number of Occurrences:   12    Standing Expiration Date:   04/19/2022  . CMP ([facility] only)    Standing Status:   Standing    Number of Occurrences:   12    Standing Expiration Date:   04/19/2022  . Ambulatory referral to Genetics    Referral Priority:   Routine    Referral Type:   Consultation    Referral Reason:   Specialty Services Required    Number of Visits Requested:   [place], PA-C 04/19/21  ADDENDUM: Hematology/Oncology Attending: I had a face-to-face encounter with the patient today.  I reviewed her records, lab and recommended her care plan.  This is a very pleasant 87 years old white female with recently diagnosed stage IV (T3, N2, M1 C) non-small cell lung cancer, poorly differentiated adenocarcinoma in April 2022 with no actionable mutations.  The patient incidentally diagnosed with stage I left breast cancer. I had a lengthy discussion with the patient and her daughter, Mickel Baas today about her current disease stage, prognosis and treatment options.  I discussed with the patient several options for management of her condition including proceeding with the surgical resection for the breast cancer with lumpectomy followed by recovery period before starting systemic chemotherapy for the metastatic lung cancer versus  initiating the treatment for the stage IV non-small cell lung cancer and treating the patient with hormonal therapy for the breast cancer during the induction phase of her systemic therapy and then revisiting the breast cancer surgery in the future. The patient understandably is concerned about delaying treatment for lung cancer and she would like to proceed with the treatment as soon as possible. I recommended for her systemic chemotherapy with carboplatin for AUC of 5, Alimta 500 Mg/M2 and Keytruda 200 Mg IV every 3 weeks.  I discussed with the patient the adverse effect of this treatment including but not limited to alopecia, myelosuppression, nausea and vomiting, peripheral neuropathy, liver or renal dysfunction in addition to the adverse effect of the immunotherapy.  The patient was also given the option of palliative care and hospice referral. She is interested in proceeding with the treatment and she is expected to start the first cycle of her chemotherapy next week.  She will receive vitamin B12 injection today. Will call her pharmacy with prescription for Compazine 10 mg p.o. every 6 hours as needed for nausea in addition to folic acid 1 mg p.o. daily.  The patient is also interested in having a Port-A-Cath placed and we will send her to IR for this procedure.  She will also have prescription for Emla cream to be applied to the Port-A-Cath site before treatment. She will come back for follow-up visit in 2 weeks for evaluation and management of any adverse effect of her treatment. For the stage I left breast cancer, we will start the patient on hormonal therapy with Arimidex 1 mg p.o. daily. The patient was advised to call immediately if she has any concerning symptoms in the interval. The total time spent in the appointment was 55 minutes. Disclaimer: This note was dictated with voice recognition software. Similar sounding words can inadvertently be transcribed and may be missed upon  review.  Eilleen Kempf, MD 04/19/21

## 2021-04-17 ENCOUNTER — Telehealth: Payer: Self-pay | Admitting: *Deleted

## 2021-04-17 NOTE — Telephone Encounter (Signed)
   [facility] Pre-operative Risk Assessment    Patient Name: Kim Ward  DOB: 1931/01/29  MRN: [id number]   HEARTCARE STAFF: - Please ensure there is not already an duplicate clearance open for this procedure. - Under Visit Info/Reason for Call, type in Other and utilize the format Clearance MM/DD/YY or Clearance TBD. Do not use dashes or single digits. - If request is for dental extraction, please clarify the # of teeth to be extracted. - If the patient is currently at the dentist's office, call Pre-Op APP to address. If the patient is not currently in the dentist office, please route to the Pre-Op pool  Request for surgical clearance:  1. What type of surgery is being performed? LEFT  RADIOACTIVE SEED LOCALIZED LUMPECTOMY  (BREAST CANCER STAGE 1)  2. When is this surgery scheduled? TBD   3. What type of clearance is required (medical clearance vs. Pharmacy clearance to hold med vs. Both)? MEDICAL  4. Are there any medications that need to be held prior to surgery and how long? NONE LISTED   5. Practice name and name of physician performing surgery? [facility]; DR. Rodman Key TSUEI   6. What is the office phone number? [phone]   7.   What is the office fax number? [place]: Kim Lorenzo, LPN  8.   Anesthesia type (None, local, MAC, general) ? LMA   [name] 04/17/2021, 12:41 PM  _________________________________________________________________   (provider comments below)

## 2021-04-18 ENCOUNTER — Other Ambulatory Visit: Payer: Self-pay | Admitting: Physician Assistant

## 2021-04-18 DIAGNOSIS — C3411 Malignant neoplasm of upper lobe, right bronchus or lung: Secondary | ICD-10-CM

## 2021-04-18 NOTE — Telephone Encounter (Signed)
   Name: Kim Ward  DOB: [birth year]  MRN: [id number]   Primary Cardiologist: Kirk Ruths, MD  Chart reviewed as part of pre-operative protocol coverage. Patient was contacted 04/18/2021 in reference to pre-operative risk assessment for pending surgery as outlined below.  Kim Ward was last seen on 03/14/21 by Dr. Stanford Breed.  Since that day, Kim Ward has done well.  Therefore, based on ACC/AHA guidelines, the patient would be at acceptable risk for the planned procedure without further cardiovascular testing.   The patient was advised that if she develops new symptoms prior to surgery to contact our office to arrange for a follow-up visit, and she verbalized understanding.  I will route this recommendation to the requesting party via Epic fax function and remove from pre-op pool. Please call with questions.  Loel Dubonnet, NP 04/18/2021, 10:26 AM

## 2021-04-19 ENCOUNTER — Inpatient Hospital Stay (HOSPITAL_BASED_OUTPATIENT_CLINIC_OR_DEPARTMENT_OTHER): Payer: Medicare PPO | Admitting: Physician Assistant

## 2021-04-19 ENCOUNTER — Inpatient Hospital Stay: Payer: Medicare PPO | Attending: Physician Assistant

## 2021-04-19 ENCOUNTER — Other Ambulatory Visit: Payer: Self-pay

## 2021-04-19 ENCOUNTER — Other Ambulatory Visit: Payer: Self-pay | Admitting: Internal Medicine

## 2021-04-19 ENCOUNTER — Encounter: Payer: Self-pay | Admitting: Physician Assistant

## 2021-04-19 VITALS — BP 141/47 | HR 61 | Temp 97.5°F | Resp 18 | Ht 65.0 in | Wt 155.4 lb

## 2021-04-19 DIAGNOSIS — C3411 Malignant neoplasm of upper lobe, right bronchus or lung: Secondary | ICD-10-CM | POA: Diagnosis not present

## 2021-04-19 DIAGNOSIS — C7971 Secondary malignant neoplasm of right adrenal gland: Secondary | ICD-10-CM | POA: Insufficient documentation

## 2021-04-19 DIAGNOSIS — C3431 Malignant neoplasm of lower lobe, right bronchus or lung: Secondary | ICD-10-CM | POA: Insufficient documentation

## 2021-04-19 DIAGNOSIS — Z170 Estrogen receptor positive status [ER+]: Secondary | ICD-10-CM | POA: Diagnosis not present

## 2021-04-19 DIAGNOSIS — Z79811 Long term (current) use of aromatase inhibitors: Secondary | ICD-10-CM | POA: Diagnosis not present

## 2021-04-19 DIAGNOSIS — C50912 Malignant neoplasm of unspecified site of left female breast: Secondary | ICD-10-CM

## 2021-04-19 DIAGNOSIS — Z5111 Encounter for antineoplastic chemotherapy: Secondary | ICD-10-CM | POA: Diagnosis not present

## 2021-04-19 DIAGNOSIS — C50919 Malignant neoplasm of unspecified site of unspecified female breast: Secondary | ICD-10-CM | POA: Insufficient documentation

## 2021-04-19 DIAGNOSIS — D0512 Intraductal carcinoma in situ of left breast: Secondary | ICD-10-CM | POA: Diagnosis not present

## 2021-04-19 DIAGNOSIS — Z5112 Encounter for antineoplastic immunotherapy: Secondary | ICD-10-CM | POA: Diagnosis present

## 2021-04-19 DIAGNOSIS — Z79899 Other long term (current) drug therapy: Secondary | ICD-10-CM | POA: Diagnosis not present

## 2021-04-19 DIAGNOSIS — C7972 Secondary malignant neoplasm of left adrenal gland: Secondary | ICD-10-CM | POA: Insufficient documentation

## 2021-04-19 DIAGNOSIS — Z87891 Personal history of nicotine dependence: Secondary | ICD-10-CM | POA: Diagnosis not present

## 2021-04-19 LAB — CBC WITH DIFFERENTIAL (CANCER CENTER ONLY)
Abs Immature Granulocytes: 0.02 10*3/uL (ref 0.00–0.07)
Basophils Absolute: 0.1 10*3/uL (ref 0.0–0.1)
Basophils Relative: 1 %
Eosinophils Absolute: 0.1 10*3/uL (ref 0.0–0.5)
Eosinophils Relative: 1 %
HCT: 37.1 % (ref 36.0–46.0)
Hemoglobin: 11.9 g/dL — ABNORMAL LOW (ref 12.0–15.0)
Immature Granulocytes: 0 %
Lymphocytes Relative: 22 %
Lymphs Abs: 1.7 10*3/uL (ref 0.7–4.0)
MCH: 27.9 pg (ref 26.0–34.0)
MCHC: 32.1 g/dL (ref 30.0–36.0)
MCV: 87.1 fL (ref 80.0–100.0)
Monocytes Absolute: 0.6 10*3/uL (ref 0.1–1.0)
Monocytes Relative: 8 %
Neutro Abs: 5.1 10*3/uL (ref 1.7–7.7)
Neutrophils Relative %: 68 %
Platelet Count: 263 10*3/uL (ref 150–400)
RBC: 4.26 MIL/uL (ref 3.87–5.11)
RDW: 14.6 % (ref 11.5–15.5)
WBC Count: 7.6 10*3/uL (ref 4.0–10.5)
nRBC: 0 % (ref 0.0–0.2)

## 2021-04-19 LAB — CMP (CANCER CENTER ONLY)
ALT: 16 U/L (ref 0–44)
AST: 18 U/L (ref 15–41)
Albumin: 3.5 g/dL (ref 3.5–5.0)
Alkaline Phosphatase: 89 U/L (ref 38–126)
Anion gap: 9 (ref 5–15)
BUN: 16 mg/dL (ref 8–23)
CO2: 25 mmol/L (ref 22–32)
Calcium: 9.3 mg/dL (ref 8.9–10.3)
Chloride: 107 mmol/L (ref 98–111)
Creatinine: 0.88 mg/dL (ref 0.44–1.00)
GFR, Estimated: >60 mL/min (ref >60)
Glucose, Bld: 84 mg/dL (ref 70–99)
Potassium: 4.1 mmol/L (ref 3.5–5.1)
Sodium: 141 mmol/L (ref 135–145)
Total Bilirubin: 0.4 mg/dL (ref 0.3–1.2)
Total Protein: 6.8 g/dL (ref 6.5–8.1)

## 2021-04-19 MED ORDER — FOLIC ACID 1 MG PO TABS: 1.0000 mg | ORAL_TABLET | Freq: Every day | ORAL | 2 refills | Status: AC

## 2021-04-19 MED ORDER — CYANOCOBALAMIN 1000 MCG/ML IJ SOLN: 1000.0000 ug | Freq: Once | INTRAMUSCULAR | Status: DC

## 2021-04-19 MED ORDER — CYANOCOBALAMIN 1000 MCG/ML IJ SOLN
INTRAMUSCULAR | Status: AC
  Filled 2021-04-19: qty 1

## 2021-04-19 MED ORDER — ANASTROZOLE 1 MG PO TABS: 1.0000 mg | ORAL_TABLET | Freq: Every day | ORAL | 2 refills | Status: AC

## 2021-04-19 MED ORDER — LIDOCAINE-PRILOCAINE 2.5-2.5 % EX CREA: 1.0000 "application " | TOPICAL_CREAM | CUTANEOUS | 2 refills | Status: AC | PRN

## 2021-04-19 MED ORDER — CYANOCOBALAMIN 1000 MCG/ML IJ SOLN
1000.0000 ug | Freq: Once | INTRAMUSCULAR | Status: AC
  Administered 2021-04-19: 1000 ug via INTRAMUSCULAR

## 2021-04-19 MED ORDER — PROCHLORPERAZINE MALEATE 10 MG PO TABS: 10.0000 mg | ORAL_TABLET | Freq: Four times a day (QID) | ORAL | 2 refills | Status: AC | PRN

## 2021-04-19 NOTE — Progress Notes (Signed)
START ON PATHWAY REGIMEN - Non-Small Cell Lung     A cycle is every 21 days:     Pembrolizumab      Pemetrexed      Carboplatin   **Always confirm dose/schedule in your pharmacy ordering system**  Patient Characteristics: Stage IV Metastatic, Nonsquamous, Molecular Analysis Completed, Molecular Alteration Present and Targeted Therapy Exhausted OR EGFR Exon 20+ or KRAS G12C+ Present and No Prior Chemo/Immunotherapy OR No Alteration Present, Initial  Chemotherapy/Immunotherapy, PS = 0, 1, No Alteration Present, No Alteration Present, Candidate for Immunotherapy, PD-L1 Expression Positive 1-49% (TPS) / Negative / Not Tested / Awaiting Test Results and Immunotherapy Candidate Therapeutic Status: Stage IV Metastatic Histology: Nonsquamous Cell Broad Molecular Profiling Status: Molecular Analysis Completed Molecular Analysis Results: No Alteration Present ECOG Performance Status: 1 Chemotherapy/Immunotherapy Line of Therapy: Initial Chemotherapy/Immunotherapy EGFR Exons 18-21 Mutation Testing Status: Completed and Negative ALK Fusion/Rearrangement Testing Status: Completed and Negative BRAF V600 Mutation Testing Status: Completed and Negative KRAS G12C Mutation Testing Status: Completed and Negative MET Exon 14 Mutation Testing Status: Completed and Negative RET Fusion/Rearrangement Testing Status: Completed and Negative NTRK Fusion/Rearrangement Testing Status: Completed and Negative ROS1 Fusion/Rearrangement Testing Status: Completed and Negative Immunotherapy Candidate Status: Candidate for Immunotherapy PD-L1 Expression Status: Quantity Not Sufficient Intent of Therapy: Non-Curative / Palliative Intent, Discussed with Patient

## 2021-04-19 NOTE — Patient Instructions (Addendum)
Summary:  -There are two main categories of lung cancer, they are named based on the size of the cancer cell. One is called Non-Small cell lung cancer. The other type is Small Cell Lung Cancer -The sample (biopsy) that they took of your tumor was consistent with a subtype of Non-small cell lung cancer called Adenocarcinoma. This is the most common type of lung cancer.  -We covered a lot of important information at your appointment today regarding what the treatment plan is moving forward. Here are the the main points that were discussed at your office visit with [place] today:  -The treatment that you will receive consists of two chemotherapy drugs, called Carboplatin and Alimta (also called Pemetrexed) and one immunotherapy drug called Keytruda (pembrolizumab).  -We are planning on starting your treatment next week before your start your treatment, I would like you to attend a Chemotherapy Education Class. This involves having you sit down with one of our nurse educators. She will discuss with your one-on-one more details about your treatment as well as general information about resources here at the cancer center.  -Your treatment will be given once every 3 weeks. We will check your labs once a week for the first ~5 treatments just to make sure that important components of your blood are in an acceptable range. Once you start the maintenance phase, then you can proceed with surgery. -We will get a CT scan after 3 treatments to check on the progress of treatment  Medications:  -I have sent a few important medication prescriptions to your pharmacy.  -Compazine was sent to your pharmacy. This medication is for nausea. You may take this every 6 hours as needed if you feel nauseous.  -I have also sent a prescription for 1 mg of folic acid to your pharmacy. We need you to take 1 tablet every day.  -We will administer vitamin B12 every 9 weeks while you are here in the clinic. You have received your first dose  today.    Follow up:  -We will see you back for a follow up visit 1 week after your first treatment to see how it went and help manage any side effects of treatment that you may have   -If you need to reach [place] at any time, the main office number to the cancer center is [phone], when you call, ask to speak to either Cassie's or Dr. Worthy Flank nurse.   Pembrolizumab injection What is this medicine? PEMBROLIZUMAB (pem broe liz ue mab) is a monoclonal antibody. It is used to treat certain types of cancer. This medicine may be used for other purposes; ask your health care provider or pharmacist if you have questions. COMMON BRAND NAME(S): Keytruda What should I tell my health care provider before I take this medicine? They need to know if you have any of these conditions:  autoimmune diseases like Crohn's disease, ulcerative colitis, or lupus  have had or planning to have an allogeneic stem cell transplant (uses someone else's stem cells)  history of organ transplant  history of chest radiation  nervous system problems like myasthenia gravis or Guillain-Barre syndrome  an unusual or allergic reaction to pembrolizumab, other medicines, foods, dyes, or preservatives  pregnant or trying to get pregnant  breast-feeding How should I use this medicine? This medicine is for infusion into a vein. It is given by a health care professional in a hospital or clinic setting. A special MedGuide will be given to you before each treatment. Be sure  to read this information carefully each time. Talk to your pediatrician regarding the use of this medicine in children. While this drug may be prescribed for children as young as 6 months for selected conditions, precautions do apply. Overdosage: If you think you have taken too much of this medicine contact a poison control center or emergency room at once. NOTE: This medicine is only for you. Do not share this medicine with others. What if I miss a  dose? It is important not to miss your dose. Call your doctor or health care professional if you are unable to keep an appointment. What may interact with this medicine? Interactions have not been studied. This list may not describe all possible interactions. Give your health care provider a list of all the medicines, herbs, non-prescription drugs, or dietary supplements you use. Also tell them if you smoke, drink alcohol, or use illegal drugs. Some items may interact with your medicine. What should I watch for while using this medicine? Your condition will be monitored carefully while you are receiving this medicine. You may need blood work done while you are taking this medicine. Do not become pregnant while taking this medicine or for 4 months after stopping it. Women should inform their doctor if they wish to become pregnant or think they might be pregnant. There is a potential for serious side effects to an unborn child. Talk to your health care professional or pharmacist for more information. Do not breast-feed an infant while taking this medicine or for 4 months after the last dose. What side effects may I notice from receiving this medicine? Side effects that you should report to your doctor or health care professional as soon as possible:  allergic reactions like skin rash, itching or hives, swelling of the face, lips, or tongue  bloody or black, tarry  breathing problems  changes in vision  chest pain  chills  confusion  constipation  cough  diarrhea  dizziness or feeling faint or lightheaded  fast or irregular heartbeat  fever  flushing  joint pain  low blood counts - this medicine may decrease the number of white blood cells, red blood cells and platelets. You may be at increased risk for infections and bleeding.  muscle pain  muscle weakness  pain, tingling, numbness in the hands or feet  persistent headache  redness, blistering, peeling or loosening  of the skin, including inside the mouth  signs and symptoms of high blood sugar such as dizziness; dry mouth; dry skin; fruity breath; nausea; stomach pain; increased hunger or thirst; increased urination  signs and symptoms of kidney injury like trouble passing urine or change in the amount of urine  signs and symptoms of liver injury like dark urine, light-colored stools, loss of appetite, nausea, right upper belly pain, yellowing of the eyes or skin  sweating  swollen lymph nodes  weight loss Side effects that usually do not require medical attention (report to your doctor or health care professional if they continue or are bothersome):  decreased appetite  hair loss  tiredness This list may not describe all possible side effects. Call your doctor for medical advice about side effects. You may report side effects to FDA at 1-800-FDA-1088. Where should I keep my medicine? This drug is given in a hospital or clinic and will not be stored at home. NOTE: This sheet is a summary. It may not cover all possible information. If you have questions about this medicine, talk to your doctor, pharmacist, or  health care provider.  2021 Elsevier/Gold Standard (2019-10-06 21:44:53)

## 2021-04-24 ENCOUNTER — Telehealth: Payer: Self-pay | Admitting: Internal Medicine

## 2021-04-24 NOTE — Telephone Encounter (Signed)
Scheduled per los. Called and spoke with patient. Confirmed appts but patient needs to call daughter to make sure the schedule works for her and will call me back

## 2021-04-25 ENCOUNTER — Telehealth: Payer: Self-pay | Admitting: Internal Medicine

## 2021-04-25 NOTE — Telephone Encounter (Signed)
Called and spoke with patient to confirm all appts. She is aware and will be here tomorrow for lab and chemo edu.

## 2021-04-26 ENCOUNTER — Inpatient Hospital Stay: Payer: Medicare PPO

## 2021-04-26 ENCOUNTER — Other Ambulatory Visit: Payer: Self-pay

## 2021-04-26 ENCOUNTER — Encounter: Payer: Self-pay | Admitting: Internal Medicine

## 2021-04-26 DIAGNOSIS — C3411 Malignant neoplasm of upper lobe, right bronchus or lung: Secondary | ICD-10-CM

## 2021-04-26 DIAGNOSIS — Z5112 Encounter for antineoplastic immunotherapy: Secondary | ICD-10-CM | POA: Diagnosis not present

## 2021-04-26 LAB — CMP (CANCER CENTER ONLY)
ALT: 18 U/L (ref 0–44)
AST: 16 U/L (ref 15–41)
Albumin: 3.5 g/dL (ref 3.5–5.0)
Alkaline Phosphatase: 82 U/L (ref 38–126)
Anion gap: 9 (ref 5–15)
BUN: 23 mg/dL (ref 8–23)
CO2: 26 mmol/L (ref 22–32)
Calcium: 9.4 mg/dL (ref 8.9–10.3)
Chloride: 106 mmol/L (ref 98–111)
Creatinine: 0.98 mg/dL (ref 0.44–1.00)
GFR, Estimated: 56 mL/min — ABNORMAL LOW (ref >60)
Glucose, Bld: 82 mg/dL (ref 70–99)
Potassium: 4.2 mmol/L (ref 3.5–5.1)
Sodium: 141 mmol/L (ref 135–145)
Total Bilirubin: 0.5 mg/dL (ref 0.3–1.2)
Total Protein: 7 g/dL (ref 6.5–8.1)

## 2021-04-26 LAB — CBC WITH DIFFERENTIAL (CANCER CENTER ONLY)
Abs Immature Granulocytes: 0.03 10*3/uL (ref 0.00–0.07)
Basophils Absolute: 0.1 10*3/uL (ref 0.0–0.1)
Basophils Relative: 1 %
Eosinophils Absolute: 0.1 10*3/uL (ref 0.0–0.5)
Eosinophils Relative: 1 %
HCT: 36.4 % (ref 36.0–46.0)
Hemoglobin: 11.9 g/dL — ABNORMAL LOW (ref 12.0–15.0)
Immature Granulocytes: 0 %
Lymphocytes Relative: 25 %
Lymphs Abs: 2 10*3/uL (ref 0.7–4.0)
MCH: 28.7 pg (ref 26.0–34.0)
MCHC: 32.7 g/dL (ref 30.0–36.0)
MCV: 87.7 fL (ref 80.0–100.0)
Monocytes Absolute: 0.7 10*3/uL (ref 0.1–1.0)
Monocytes Relative: 9 %
Neutro Abs: 5 10*3/uL (ref 1.7–7.7)
Neutrophils Relative %: 64 %
Platelet Count: 268 10*3/uL (ref 150–400)
RBC: 4.15 MIL/uL (ref 3.87–5.11)
RDW: 14.7 % (ref 11.5–15.5)
WBC Count: 7.9 10*3/uL (ref 4.0–10.5)
nRBC: 0 % (ref 0.0–0.2)

## 2021-04-26 NOTE — Progress Notes (Signed)
Met with patent and accompanying adult at registration to introduce myself as Arboriculturist and to offer available resources.  Discussed one-time $1000 Radio broadcast assistant to assist with personal expenses while going through treatment.  Gave her my card if interested in applying and for any additional financial questions or concerns.

## 2021-04-27 ENCOUNTER — Inpatient Hospital Stay: Payer: Medicare PPO

## 2021-04-27 VITALS — BP 161/61 | HR 85 | Temp 98.1°F | Resp 18

## 2021-04-27 DIAGNOSIS — C3410 Malignant neoplasm of upper lobe, unspecified bronchus or lung: Secondary | ICD-10-CM

## 2021-04-27 DIAGNOSIS — Z5112 Encounter for antineoplastic immunotherapy: Secondary | ICD-10-CM | POA: Diagnosis not present

## 2021-04-27 MED ORDER — PALONOSETRON HCL INJECTION 0.25 MG/5ML
INTRAVENOUS | Status: AC
  Filled 2021-04-27: qty 5

## 2021-04-27 MED ORDER — SODIUM CHLORIDE 0.9 % IV SOLN
500.0000 mg/m2 | Freq: Once | INTRAVENOUS | Status: AC
  Administered 2021-04-27: 900 mg via INTRAVENOUS
  Filled 2021-04-27: qty 20

## 2021-04-27 MED ORDER — SODIUM CHLORIDE 0.9 % IV SOLN
150.0000 mg | Freq: Once | INTRAVENOUS | Status: AC
  Administered 2021-04-27: 150 mg via INTRAVENOUS
  Filled 2021-04-27: qty 150

## 2021-04-27 MED ORDER — SODIUM CHLORIDE 0.9 % IV SOLN
150.0000 mg | Freq: Once | INTRAVENOUS | Status: DC
  Filled 2021-04-27: qty 5

## 2021-04-27 MED ORDER — SODIUM CHLORIDE 0.9 % IV SOLN
Freq: Once | INTRAVENOUS | Status: AC
  Filled 2021-04-27: qty 250

## 2021-04-27 MED ORDER — SODIUM CHLORIDE 0.9 % IV SOLN
10.0000 mg | Freq: Once | INTRAVENOUS | Status: AC
  Administered 2021-04-27: 10 mg via INTRAVENOUS
  Filled 2021-04-27: qty 10

## 2021-04-27 MED ORDER — SODIUM CHLORIDE 0.9 % IV SOLN
200.0000 mg | Freq: Once | INTRAVENOUS | Status: AC
  Administered 2021-04-27: 200 mg via INTRAVENOUS
  Filled 2021-04-27: qty 8

## 2021-04-27 MED ORDER — PALONOSETRON HCL INJECTION 0.25 MG/5ML
0.2500 mg | Freq: Once | INTRAVENOUS | Status: AC
Start: 2021-04-27 — End: 2021-04-27
  Administered 2021-04-27: 0.25 mg via INTRAVENOUS

## 2021-04-27 MED ORDER — SODIUM CHLORIDE 0.9 % IV SOLN
10.0000 mg | Freq: Once | INTRAVENOUS | Status: DC
  Filled 2021-04-27: qty 1

## 2021-04-27 MED ORDER — SODIUM CHLORIDE 0.9 % IV SOLN
341.5000 mg | Freq: Once | INTRAVENOUS | Status: AC
  Administered 2021-04-27: 340 mg via INTRAVENOUS
  Filled 2021-04-27: qty 34

## 2021-04-27 NOTE — Patient Instructions (Signed)
[facility]  Discharge Instructions: Thank you for choosing [facility] to provide your oncology and hematology care.   If you have a lab appointment with the [facility], please go directly to the [facility] and check in at the registration area.   Wear comfortable clothing and clothing appropriate for easy access to any Portacath or PICC line.   We strive to give you quality time with your provider. You may need to reschedule your appointment if you arrive late (15 or more minutes).  Arriving late affects you and other patients whose appointments are after yours.  Also, if you miss three or more appointments without notifying the office, you may be dismissed from the clinic at the provider's discretion.      For prescription refill requests, have your pharmacy contact our office and allow 72 hours for refills to be completed.    Today you received the following chemotherapy and/or immunotherapy agents: pembrolizumab, pemetrexed, and carboplatin.      To help prevent nausea and vomiting after your treatment, we encourage you to take your nausea medication as directed.  BELOW ARE SYMPTOMS THAT SHOULD BE REPORTED IMMEDIATELY: *FEVER GREATER THAN 100.4 F (38 C) OR HIGHER *CHILLS OR SWEATING *NAUSEA AND VOMITING THAT IS NOT CONTROLLED WITH YOUR NAUSEA MEDICATION *UNUSUAL SHORTNESS OF BREATH *UNUSUAL BRUISING OR BLEEDING *URINARY PROBLEMS (pain or burning when urinating, or frequent urination) *BOWEL PROBLEMS (unusual diarrhea, constipation, pain near the anus) TENDERNESS IN MOUTH AND THROAT WITH OR WITHOUT PRESENCE OF ULCERS (sore throat, sores in mouth, or a toothache) UNUSUAL RASH, SWELLING OR PAIN  UNUSUAL VAGINAL DISCHARGE OR ITCHING   Items with * indicate a potential emergency and should be followed up as soon as possible or go to the Emergency Department if any problems should occur.  Please show the CHEMOTHERAPY ALERT CARD or  IMMUNOTHERAPY ALERT CARD at check-in to the Emergency Department and triage nurse.  Should you have questions after your visit or need to cancel or reschedule your appointment, please contact [facility]  Dept: [phone]  and follow the prompts.  Office hours are 8:00 a.m. to 4:30 p.m. Monday - Friday. Please note that voicemails left after 4:00 p.m. may not be returned until the following business day.  We are closed weekends and major holidays. You have access to a nurse at all times for urgent questions. Please call the main number to the clinic Dept: [phone] and follow the prompts.   For any non-urgent questions, you may also contact your provider using MyChart. We now offer e-Visits for anyone 6 and older to request care online for non-urgent symptoms. For details visit [email or web address].   Also download the MyChart app! Go to the app store, search "MyChart", open the app, select [facility], and log in with your MyChart username and password.  Due to Covid, a mask is required upon entering the hospital/clinic. If you do not have a mask, one will be given to you upon arrival. For doctor visits, patients may have 1 support person aged 43 or older with them. For treatment visits, patients cannot have anyone with them due to current Covid guidelines and our immunocompromised population.   Pembrolizumab injection What is this medication? PEMBROLIZUMAB (pem broe liz ue mab) is a monoclonal antibody. It is used totreat certain types of cancer. This medicine may be used for other purposes; ask your health care provider orpharmacist if you have questions. COMMON BRAND NAME(S): [employer]  What should I tell my care team before I take this medication? They need to know if you have any of these conditions: autoimmune diseases like Crohn's disease, ulcerative colitis, or lupus have had or planning to have an allogeneic stem cell transplant (uses someone else's  stem cells) history of organ transplant history of chest radiation nervous system problems like myasthenia gravis or Guillain-Barre syndrome an unusual or allergic reaction to pembrolizumab, other medicines, foods, dyes, or preservatives pregnant or trying to get pregnant breast-feeding How should I use this medication? This medicine is for infusion into a vein. It is given by a health careprofessional in a hospital or clinic setting. A special MedGuide will be given to you before each treatment. Be sure to readthis information carefully each time. Talk to your pediatrician regarding the use of this medicine in children. While this drug may be prescribed for children as young as 6 months for selectedconditions, precautions do apply. Overdosage: If you think you have taken too much of this medicine contact apoison control center or emergency room at once. NOTE: This medicine is only for you. Do not share this medicine with others. What if I miss a dose? It is important not to miss your dose. Call your doctor or health careprofessional if you are unable to keep an appointment. What may interact with this medication? Interactions have not been studied. This list may not describe all possible interactions. Give your health care provider a list of all the medicines, herbs, non-prescription drugs, or dietary supplements you use. Also tell them if you smoke, drink alcohol, or use illegaldrugs. Some items may interact with your medicine. What should I watch for while using this medication? Your condition will be monitored carefully while you are receiving thismedicine. You may need blood work done while you are taking this medicine. Do not become pregnant while taking this medicine or for 4 months after stopping it. Women should inform their doctor if they wish to become pregnant or think they might be pregnant. There is a potential for serious side effects to an unborn child. Talk to your health care  professional or pharmacist for more information. Do not breast-feed an infant while taking this medicine orfor 4 months after the last dose. What side effects may I notice from receiving this medication? Side effects that you should report to your doctor or health care professionalas soon as possible: allergic reactions like skin rash, itching or hives, swelling of the face, lips, or tongue bloody or black, tarry breathing problems changes in vision chest pain chills confusion constipation cough diarrhea dizziness or feeling faint or lightheaded fast or irregular heartbeat fever flushing joint pain low blood counts - this medicine may decrease the number of white blood cells, red blood cells and platelets. You may be at increased risk for infections and bleeding. muscle pain muscle weakness pain, tingling, numbness in the hands or feet persistent headache redness, blistering, peeling or loosening of the skin, including inside the mouth signs and symptoms of high blood sugar such as dizziness; dry mouth; dry skin; fruity breath; nausea; stomach pain; increased hunger or thirst; increased urination signs and symptoms of kidney injury like trouble passing urine or change in the amount of urine signs and symptoms of liver injury like dark urine, light-colored stools, loss of appetite, nausea, right upper belly pain, yellowing of the eyes or skin sweating swollen lymph nodes weight loss Side effects that usually do not require medical attention (report to yourdoctor or health care professional  if they continue or are bothersome): decreased appetite hair loss tiredness This list may not describe all possible side effects. Call your doctor for medical advice about side effects. You may report side effects to FDA at1-800-FDA-1088. Where should I keep my medication? This drug is given in a hospital or clinic and will not be stored at home. NOTE: This sheet is a summary. It may not cover  all possible information. If you have questions about this medicine, talk to your doctor, pharmacist, orhealth care provider.  2022 Elsevier/Gold Standard (2019-10-06 21:44:53)  Pemetrexed injection What is this medication? PEMETREXED (PEM e TREX ed) is a chemotherapy drug used to treat lung cancers like non-small cell lung cancer and mesothelioma. It may also be used to treatother cancers. This medicine may be used for other purposes; ask your health care provider orpharmacist if you have questions. COMMON BRAND NAME(S): Alimta What should I tell my care team before I take this medication? They need to know if you have any of these conditions: infection (especially a virus infection such as chickenpox, cold sores, or herpes) kidney disease low blood counts, like low white cell, platelet, or red cell counts lung or breathing disease, like asthma radiation therapy an unusual or allergic reaction to pemetrexed, other medicines, foods, dyes, or preservative pregnant or trying to get pregnant breast-feeding How should I use this medication? This drug is given as an infusion into a vein. It is administered in a hospitalor clinic by a specially trained health care professional. Talk to your pediatrician regarding the use of this medicine in children.Special care may be needed. Overdosage: If you think you have taken too much of this medicine contact apoison control center or emergency room at once. NOTE: This medicine is only for you. Do not share this medicine with others. What if I miss a dose? It is important not to miss your dose. Call your doctor or health careprofessional if you are unable to keep an appointment. What may interact with this medication? This medicine may interact with the following medications: Ibuprofen This list may not describe all possible interactions. Give your health care provider a list of all the medicines, herbs, non-prescription drugs, or dietary supplements  you use. Also tell them if you smoke, drink alcohol, or use illegaldrugs. Some items may interact with your medicine. What should I watch for while using this medication? Visit your doctor for checks on your progress. This drug may make you feel generally unwell. This is not uncommon, as chemotherapy can affect healthy cells as well as cancer cells. Report any side effects. Continue your course oftreatment even though you feel ill unless your doctor tells you to stop. In some cases, you may be given additional medicines to help with side effects.Follow all directions for their use. Call your doctor or health care professional for advice if you get a fever, chills or sore throat, or other symptoms of a cold or flu. Do not treat yourself. This drug decreases your body's ability to fight infections. Try toavoid being around people who are sick. This medicine may increase your risk to bruise or bleed. Call your doctor orhealth care professional if you notice any unusual bleeding. Be careful brushing and flossing your teeth or using a toothpick because you may get an infection or bleed more easily. If you have any dental work done,tell your dentist you are receiving this medicine. Avoid taking products that contain aspirin, acetaminophen, ibuprofen, naproxen, or ketoprofen unless instructed by your doctor. These medicines  may hide afever. Call your doctor or health care professional if you get diarrhea or mouthsores. Do not treat yourself. To protect your kidneys, drink water or other fluids as directed while you aretaking this medicine. Do not become pregnant while taking this medicine or for 6 months after stopping it. Women should inform their doctor if they wish to become pregnant or think they might be pregnant. Men should not father a child while taking this medicine and for 3 months after stopping it. This may interfere with the ability to father a child. You should talk to your doctor or health care  professional if you are concerned about your fertility. There is a potential for serious side effects to an unborn child. Talk to your health care professional or pharmacist for more information. Do not breast-feed an infantwhile taking this medicine or for 1 week after stopping it. What side effects may I notice from receiving this medication? Side effects that you should report to your doctor or health care professionalas soon as possible: allergic reactions like skin rash, itching or hives, swelling of the face, lips, or tongue breathing problems redness, blistering, peeling or loosening of the skin, including inside the mouth signs and symptoms of bleeding such as bloody or black, tarry stools; red or dark-brown urine; spitting up blood or brown material that looks like coffee grounds; red spots on the skin; unusual bruising or bleeding from the eye, gums, or nose signs and symptoms of infection like fever or chills; cough; sore throat; pain or trouble passing urine signs and symptoms of kidney injury like trouble passing urine or change in the amount of urine signs and symptoms of liver injury like dark yellow or brown urine; general ill feeling or flu-like symptoms; light-colored stools; loss of appetite; nausea; right upper belly pain; unusually weak or tired; yellowing of the eyes or skin Side effects that usually do not require medical attention (report to yourdoctor or health care professional if they continue or are bothersome): constipation mouth sores nausea, vomiting unusually weak or tired This list may not describe all possible side effects. Call your doctor for medical advice about side effects. You may report side effects to FDA at1-800-FDA-1088. Where should I keep my medication? This drug is given in a hospital or clinic and will not be stored at home. NOTE: This sheet is a summary. It may not cover all possible information. If you have questions about this medicine, talk to  your doctor, pharmacist, orhealth care provider.  2022 Elsevier/Gold Standard (2017-12-24 16:11:33)  Carboplatin injection What is this medication? CARBOPLATIN (KAR boe pla tin) is a chemotherapy drug. It targets fast dividing cells, like cancer cells, and causes these cells to die. This medicine is usedto treat ovarian cancer and many other cancers. This medicine may be used for other purposes; ask your health care provider orpharmacist if you have questions. COMMON BRAND NAME(S): Paraplatin What should I tell my care team before I take this medication? They need to know if you have any of these conditions: blood disorders hearing problems kidney disease recent or ongoing radiation therapy an unusual or allergic reaction to carboplatin, cisplatin, other chemotherapy, other medicines, foods, dyes, or preservatives pregnant or trying to get pregnant breast-feeding How should I use this medication? This drug is usually given as an infusion into a vein. It is administered in [facility] [place] or clinic by a specially trained health care professional. Talk to your pediatrician regarding the use of this medicine in children.Special care may be  needed. Overdosage: If you think you have taken too much of this medicine contact apoison control center or emergency room at once. NOTE: This medicine is only for you. Do not share this medicine with others. What if I miss a dose? It is important not to miss a dose. Call your doctor or health careprofessional if you are unable to keep an appointment. What may interact with this medication? medicines for seizures medicines to increase blood counts like filgrastim, pegfilgrastim, sargramostim some antibiotics like amikacin, gentamicin, neomycin, streptomycin, tobramycin vaccines Talk to your doctor or health care professional before taking any of thesemedicines: acetaminophen aspirin ibuprofen ketoprofen naproxen This list may not describe all possible  interactions. Give your health care provider a list of all the medicines, herbs, non-prescription drugs, or dietary supplements you use. Also tell them if you smoke, drink alcohol, or use illegaldrugs. Some items may interact with your medicine. What should I watch for while using this medication? Your condition will be monitored carefully while you are receiving this medicine. You will need important blood work done while you are taking thismedicine. This drug may make you feel generally unwell. This is not uncommon, as chemotherapy can affect healthy cells as well as cancer cells. Report any side effects. Continue your course of treatment even though you feel ill unless yourdoctor tells you to stop. In some cases, you may be given additional medicines to help with side effects.Follow all directions for their use. Call your doctor or health care professional for advice if you get a fever, chills or sore throat, or other symptoms of a cold or flu. Do not treat yourself. This drug decreases your body's ability to fight infections. Try toavoid being around people who are sick. This medicine may increase your risk to bruise or bleed. Call your doctor orhealth care professional if you notice any unusual bleeding. Be careful brushing and flossing your teeth or using a toothpick because you may get an infection or bleed more easily. If you have any dental work done,tell your dentist you are receiving this medicine. Avoid taking products that contain aspirin, acetaminophen, ibuprofen, naproxen, or ketoprofen unless instructed by your doctor. These medicines may hide afever. Do not become pregnant while taking this medicine. Women should inform their doctor if they wish to become pregnant or think they might be pregnant. There is a potential for serious side effects to an unborn child. Talk to your health care professional or pharmacist for more information. Do not breast-feed aninfant while taking this  medicine. What side effects may I notice from receiving this medication? Side effects that you should report to your doctor or health care professionalas soon as possible: allergic reactions like skin rash, itching or hives, swelling of the face, lips, or tongue signs of infection - fever or chills, cough, sore throat, pain or difficulty passing urine signs of decreased platelets or bleeding - bruising, pinpoint red spots on the skin, black, tarry stools, nosebleeds signs of decreased red blood cells - unusually weak or tired, fainting spells, lightheadedness breathing problems changes in hearing changes in vision chest pain high blood pressure low blood counts - This drug may decrease the number of white blood cells, red blood cells and platelets. You may be at increased risk for infections and bleeding. nausea and vomiting pain, swelling, redness or irritation at the injection site pain, tingling, numbness in the hands or feet problems with balance, talking, walking trouble passing urine or change in the amount of urine Side effects that  usually do not require medical attention (report to yourdoctor or health care professional if they continue or are bothersome): hair loss loss of appetite metallic taste in the mouth or changes in taste This list may not describe all possible side effects. Call your doctor for medical advice about side effects. You may report side effects to FDA at1-800-FDA-1088. Where should I keep my medication? This drug is given in a hospital or clinic and will not be stored at home. NOTE: This sheet is a summary. It may not cover all possible information. If you have questions about this medicine, talk to your doctor, pharmacist, orhealth care provider.  2022 Elsevier/Gold Standard (2008-02-09 14:38:05)

## 2021-04-30 ENCOUNTER — Telehealth: Payer: Self-pay | Admitting: *Deleted

## 2021-04-30 ENCOUNTER — Other Ambulatory Visit: Payer: Self-pay | Admitting: Radiology

## 2021-04-30 NOTE — Telephone Encounter (Signed)
Called pt to see how she did with her treatment last week.  She reports doing well other than some fatigue.  She reports knowing how to reach [place] if needed.

## 2021-04-30 NOTE — Telephone Encounter (Signed)
-----   Message from Jesse Fall, [place] sent at 04/27/2021 11:48 AM EDT ----- Regarding: FW: Dr Julien Nordmann 1st chemo follow up  ----- Message ----- From: Bo Mcclintock, RN Sent: 04/27/2021  11:28 AM EDT To: Onc Triage Nurse [facility] Subject: Dr Julien Nordmann 1st chemo follow up                 Dr Julien Nordmann patient. 1st chemo follow up. Received [email or web address]

## 2021-05-01 ENCOUNTER — Encounter (HOSPITAL_COMMUNITY): Payer: Self-pay

## 2021-05-01 ENCOUNTER — Other Ambulatory Visit: Payer: Self-pay

## 2021-05-01 ENCOUNTER — Ambulatory Visit (HOSPITAL_COMMUNITY)
Admission: RE | Admit: 2021-05-01 | Discharge: 2021-05-01 | Disposition: A | Discharge status: Home / Self Care | Payer: Medicare PPO | Source: Ambulatory Visit | Attending: Physician Assistant | Admitting: Physician Assistant

## 2021-05-01 ENCOUNTER — Ambulatory Visit (HOSPITAL_COMMUNITY)
Admission: RE | Admit: 2021-05-01 | Discharge: 2021-05-01 | Disposition: A | Discharge status: Home / Self Care | Payer: Medicare PPO | Source: Ambulatory Visit | Attending: Internal Medicine | Admitting: Internal Medicine

## 2021-05-01 DIAGNOSIS — E785 Hyperlipidemia, unspecified: Secondary | ICD-10-CM | POA: Diagnosis not present

## 2021-05-01 DIAGNOSIS — C3411 Malignant neoplasm of upper lobe, right bronchus or lung: Secondary | ICD-10-CM | POA: Insufficient documentation

## 2021-05-01 DIAGNOSIS — Z881 Allergy status to other antibiotic agents status: Secondary | ICD-10-CM | POA: Insufficient documentation

## 2021-05-01 DIAGNOSIS — Z79899 Other long term (current) drug therapy: Secondary | ICD-10-CM | POA: Insufficient documentation

## 2021-05-01 DIAGNOSIS — F32A Depression, unspecified: Secondary | ICD-10-CM | POA: Insufficient documentation

## 2021-05-01 DIAGNOSIS — Z87891 Personal history of nicotine dependence: Secondary | ICD-10-CM | POA: Insufficient documentation

## 2021-05-01 DIAGNOSIS — Z888 Allergy status to other drugs, medicaments and biological substances status: Secondary | ICD-10-CM | POA: Diagnosis not present

## 2021-05-01 DIAGNOSIS — I10 Essential (primary) hypertension: Secondary | ICD-10-CM | POA: Insufficient documentation

## 2021-05-01 DIAGNOSIS — I350 Nonrheumatic aortic (valve) stenosis: Secondary | ICD-10-CM | POA: Diagnosis not present

## 2021-05-01 DIAGNOSIS — E039 Hypothyroidism, unspecified: Secondary | ICD-10-CM | POA: Diagnosis not present

## 2021-05-01 HISTORY — PX: IR IMAGING GUIDED PORT INSERTION: IMG5740

## 2021-05-01 LAB — PROTIME-INR
INR: 0.9 (ref 0.8–1.2)
Prothrombin Time: 12.6 seconds (ref 11.4–15.2)

## 2021-05-01 LAB — CBC WITH DIFFERENTIAL/PLATELET
Abs Immature Granulocytes: 0.02 10*3/uL (ref 0.00–0.07)
Basophils Absolute: 0 10*3/uL (ref 0.0–0.1)
Basophils Relative: 1 %
Eosinophils Absolute: 0.2 10*3/uL (ref 0.0–0.5)
Eosinophils Relative: 2 %
HCT: 41.3 % (ref 36.0–46.0)
Hemoglobin: 13.2 g/dL (ref 12.0–15.0)
Immature Granulocytes: 0 %
Lymphocytes Relative: 23 %
Lymphs Abs: 1.5 10*3/uL (ref 0.7–4.0)
MCH: 28.3 pg (ref 26.0–34.0)
MCHC: 32 g/dL (ref 30.0–36.0)
MCV: 88.6 fL (ref 80.0–100.0)
Monocytes Absolute: 0.1 10*3/uL (ref 0.1–1.0)
Monocytes Relative: 2 %
Neutro Abs: 4.7 10*3/uL (ref 1.7–7.7)
Neutrophils Relative %: 72 %
Platelets: 274 10*3/uL (ref 150–400)
RBC: 4.66 MIL/uL (ref 3.87–5.11)
RDW: 14.5 % (ref 11.5–15.5)
WBC: 6.6 10*3/uL (ref 4.0–10.5)
nRBC: 0 % (ref 0.0–0.2)

## 2021-05-01 LAB — BASIC METABOLIC PANEL
Anion gap: 9 (ref 5–15)
BUN: 19 mg/dL (ref 8–23)
CO2: 25 mmol/L (ref 22–32)
Calcium: 9.3 mg/dL (ref 8.9–10.3)
Chloride: 103 mmol/L (ref 98–111)
Creatinine, Ser: 0.9 mg/dL (ref 0.44–1.00)
GFR, Estimated: >60 mL/min (ref >60)
Glucose, Bld: 92 mg/dL (ref 70–99)
Potassium: 3.9 mmol/L (ref 3.5–5.1)
Sodium: 137 mmol/L (ref 135–145)

## 2021-05-01 MED ORDER — LIDOCAINE-EPINEPHRINE 1 %-1:100000 IJ SOLN
INTRAMUSCULAR | Status: AC
  Filled 2021-05-01: qty 1

## 2021-05-01 MED ORDER — MIDAZOLAM HCL 2 MG/2ML IJ SOLN
INTRAMUSCULAR | Status: AC | PRN
  Administered 2021-05-01 (×2): 0.5 mg via INTRAVENOUS

## 2021-05-01 MED ORDER — FENTANYL CITRATE (PF) 100 MCG/2ML IJ SOLN
INTRAMUSCULAR | Status: AC
  Filled 2021-05-01: qty 2

## 2021-05-01 MED ORDER — LIDOCAINE-EPINEPHRINE 1 %-1:100000 IJ SOLN
INTRAMUSCULAR | Status: AC | PRN
  Administered 2021-05-01 (×2): 10 mL via INTRADERMAL

## 2021-05-01 MED ORDER — HEPARIN SOD (PORK) LOCK FLUSH 100 UNIT/ML IV SOLN
INTRAVENOUS | Status: AC | PRN
  Administered 2021-05-01: 500 [IU] via INTRAVENOUS

## 2021-05-01 MED ORDER — SODIUM CHLORIDE 0.9 % IV SOLN: INTRAVENOUS | Status: DC

## 2021-05-01 MED ORDER — FENTANYL CITRATE (PF) 100 MCG/2ML IJ SOLN
INTRAMUSCULAR | Status: AC | PRN
  Administered 2021-05-01 (×2): 25 ug via INTRAVENOUS

## 2021-05-01 MED ORDER — HEPARIN SOD (PORK) LOCK FLUSH 100 UNIT/ML IV SOLN
INTRAVENOUS | Status: AC
  Filled 2021-05-01: qty 5

## 2021-05-01 MED ORDER — MIDAZOLAM HCL 2 MG/2ML IJ SOLN
INTRAMUSCULAR | Status: AC
  Filled 2021-05-01: qty 2

## 2021-05-01 NOTE — Discharge Instructions (Signed)
Interventional radiology phone numbers °[phone] °After hours [phone] ° ° ° °You have skin glue (dermabond) over your new port. Do not use the lidocaine cream (EMLA cream) over the skin glue until it has healed. The petroleum in the lidocaine cream will dissolve the skin glue resulting in an infection of your new port. Use ice in a zip lock bag for 1-2 minutes over your new port before the cancer center nurses access your port. ° ° °Implanted Port Insertion, Care After °This sheet gives you information about how to care for yourself after your procedure. Your health care provider may also give you more specific instructions. If you have problems or questions, contact your health care provider. °What can I expect after the procedure? °After the procedure, it is common to have: °Discomfort at the port insertion site. °Bruising on the skin over the port. This should improve over 3-4 days. °Follow these instructions at home: °Port care °After your port is placed, you will get a manufacturer's information card. The card has information about your port. Keep this card with you at all times. °Take care of the port as told by your health care provider. Ask your health care provider if you or a family member can get training for taking care of the port at home. A home health care nurse may also take care of the port. °Make sure to remember what type of port you have. °Incision care °Follow instructions from your health care provider about how to take care of your port insertion site. Make sure you: °Wash your hands with soap and water before and after you change your bandage (dressing). If soap and water are not available, use hand sanitizer. °Change your dressing as told by your health care provider. °Leave skin glue in place. These skin closures may need to stay in place for 2 weeks or longer.  °Check your port insertion site every day for signs of infection. Check for: °Redness, swelling, or pain. °Fluid or  blood. °Warmth. °Pus or a bad smell.  °  °  °Activity °Return to your normal activities as told by your health care provider. Ask your health care provider what activities are safe for you. °Do not lift anything that is heavier than 10 lb (4.5 kg), or the limit that you are told, until your health care provider says that it is safe. °General instructions °Take over-the-counter and prescription medicines only as told by your health care provider. °Do not take baths, swim, or use a hot tub until your health care provider approves.You may remove your dressing tomorrow and shower 24 hours after your procedure. °Do not drive for 24 hours if you were given a sedative during your procedure. °Wear a medical alert bracelet in case of an emergency. This will tell any health care providers that you have a port. °Keep all follow-up visits as told by your health care provider. This is important. °Contact a health care provider if: °You cannot flush your port with saline as directed, or you cannot draw blood from the port. °You have a fever or chills. °You have redness, swelling, or pain around your port insertion site. °You have fluid or blood coming from your port insertion site. °Your port insertion site feels warm to the touch. °You have pus or a bad smell coming from the port insertion site. °Get help right away if: °You have chest pain or shortness of breath. °You have bleeding from your port that you cannot control. °Summary °Take care of   the port as told by your health care provider. Keep the manufacturer's information card with you at all times. °Change your dressing as told by your health care provider. °Contact a health care provider if you have a fever or chills or if you have redness, swelling, or pain around your port insertion site. °Keep all follow-up visits as told by your health care provider. °This information is not intended to replace advice given to you by your health care provider. Make sure you discuss any  questions you have with your health care provider. °Document Revised: 06/02/2018 Document Reviewed: 06/02/2018 °Elsevier Patient Education © 2021 [employer] ° ° ° °Moderate Conscious Sedation, Adult, Care After °This sheet gives you information about how to care for yourself after your procedure. Your health care provider may also give you more specific instructions. If you have problems or questions, contact your health care provider. °What can I expect after the procedure? °After the procedure, it is common to have: °Sleepiness for several hours. °Impaired judgment for several hours. °Difficulty with balance. °Vomiting if you eat too soon. °Follow these instructions at home: °For the time period you were told by your health care provider: °Rest. °Do not participate in activities where you could fall or become injured. °Do not drive or use machinery. °Do not drink alcohol. °Do not take sleeping pills or medicines that cause drowsiness. °Do not make important decisions or sign legal documents. °Do not take care of children on your own.  °  °  °Eating and drinking °Follow the diet recommended by your health care provider. °Drink enough fluid to keep your urine pale yellow. °If you vomit: °Drink water, juice, or soup when you can drink without vomiting. °Make sure you have little or no nausea before eating solid foods.   °General instructions °Take over-the-counter and prescription medicines only as told by your health care provider. °Have a responsible adult stay with you for the time you are told. It is important to have someone help care for you until you are awake and alert. °Do not smoke. °Keep all follow-up visits as told by your health care provider. This is important. °Contact a health care provider if: °You are still sleepy or having trouble with balance after 24 hours. °You feel light-headed. °You keep feeling nauseous or you keep vomiting. °You develop a rash. °You have a fever. °You have redness or  swelling around the IV site. °Get help right away if: °You have trouble breathing. °You have new-onset confusion at home. °Summary °After the procedure, it is common to feel sleepy, have impaired judgment, or feel nauseous if you eat too soon. °Rest after you get home. Know the things you should not do after the procedure. °Follow the diet recommended by your health care provider and drink enough fluid to keep your urine pale yellow. °Get help right away if you have trouble breathing or new-onset confusion at home. °This information is not intended to replace advice given to you by your health care provider. Make sure you discuss any questions you have with your health care provider. °Document Revised: 03/03/2020 Document Reviewed: 09/30/2019 °Elsevier Patient Education © 2021 [employer] Inc.  °

## 2021-05-01 NOTE — Consult Note (Signed)
Chief Complaint: Patient was seen in consultation today for port a cath placement  Referring Physician(s): Heilingoetter,Cassandra L/Mohamed,M  Supervising Physician: Jacqulynn Cadet  Patient Status: [facility] - Out-pt  History of Present Illness: Kim Ward is an 87 y.o. female ,ex smoker, with PMH sig for depression, diverticulosis, HLD, HTN, hypothyroidism, sever aortic stenosis and prev SVT. She has also recently been diagnosed with both stage IV lung cancer (likely adenoca) and stage I breast cancer. She presented with large right lower lobe lung mass in addition to right hilar and mediastinal lymphadenopathy as well as bilateral adrenal metastasis in April 2022.She presents today for port a cath placement to assist with treatment.  Past Medical History:  Diagnosis Date   Adenomatous colon polyp    Depression    Tx'd 19 yrs ago when husband passed away   Diverticulosis    Hyperlipidemia    Hypertension    Hypothyroidism    Dr. Dwyane Dee follows pt.--had small nodules but had decreased in size and stopped Synthroid 06/2013   Osteoporosis    Pure hypercholesterolemia    Severe aortic stenosis    SVT (supraventricular tachycardia) [facility])     Past Surgical History:  Procedure Laterality Date   BRONCHIAL BIOPSY  03/01/2021   Procedure: BRONCHIAL BIOPSIES;  Surgeon: Garner Nash, DO;  Location: [place] [facility];  Service: Pulmonary;;   BRONCHIAL BRUSHINGS  03/01/2021   Procedure: BRONCHIAL BRUSHINGS;  Surgeon: Garner Nash, DO;  Location: [place] [facility];  Service: Pulmonary;;   BRONCHIAL NEEDLE ASPIRATION BIOPSY  03/01/2021   Procedure: BRONCHIAL NEEDLE ASPIRATION BIOPSIES;  Surgeon: Garner Nash, DO;  Location: [place] [facility];  Service: Pulmonary;;   BRONCHIAL WASHINGS  03/01/2021   Procedure: BRONCHIAL WASHINGS;  Surgeon: Garner Nash, DO;  Location: [place];  Service: Pulmonary;;   CERVICAL BIOPSY  W/ LOOP ELECTRODE EXCISION  1996   CIN I   COLONOSCOPY  03/2001,  01/2007   2008 - no polyps   DILATION AND CURETTAGE OF UTERUS     ESOPHAGOGASTRODUODENOSCOPY (EGD) WITH PROPOFOL N/A 03/02/2021   Procedure: ESOPHAGOGASTRODUODENOSCOPY (EGD) WITH PROPOFOL;  Surgeon: Jerene Bears, MD;  Location: [place];  Service: Gastroenterology;  Laterality: N/A;   FOREIGN BODY REMOVAL  03/02/2021   Procedure: FOREIGN BODY REMOVAL;  Surgeon: Jerene Bears, MD;  Location: [facility];  Service: Gastroenterology;;   LEFT HEART CATH AND CORONARY ANGIOGRAPHY N/A 01/12/2021   Procedure: LEFT HEART CATH AND CORONARY ANGIOGRAPHY;  Surgeon: Sherren Mocha, MD;  Location: [name] [facility];  Service: Cardiovascular;  Laterality: N/A;   TONSILLECTOMY     VIDEO BRONCHOSCOPY WITH ENDOBRONCHIAL ULTRASOUND N/A 03/01/2021   Procedure: VIDEO BRONCHOSCOPY WITH ENDOBRONCHIAL ULTRASOUND;  Surgeon: Garner Nash, DO;  Location: [place];  Service: Pulmonary;  Laterality: N/A;    Allergies: Augmentin [amoxicillin-pot clavulanate], Azithromycin, Bextra [valdecoxib], Cefuroxime, Cefuroxime axetil, Etodolac, Humibid la [guaifenesin], Lipitor [atorvastatin calcium], Ramipril, Risedronate sodium, Tramadol, Zocor [simvastatin], and Niaspan [niacin er]  Medications: Prior to Admission medications   Medication Sig Start Date End Date Taking? Authorizing Provider  amiodarone (PACERONE) 200 MG tablet TAKE ONE (1) TABLET BY MOUTH EVERY DAY Patient taking differently: Take 200 mg by mouth daily. 02/13/21   Vickie Epley, MD  amLODipine (NORVASC) 2.5 MG tablet Take 1 tablet (2.5 mg total) by mouth daily. Patient taking differently: Take 2.5 mg by mouth at bedtime. 10/31/20 10/31/21  Leanor Kail, PA  amoxicillin (AMOXIL) 500 MG capsule Take 500 mg by mouth as directed. 4 capsules before  dental procedures Patient not taking: Reported on 04/19/2021    [provider]  anastrozole (ARIMIDEX) 1 MG tablet Take 1 tablet (1 mg total) by mouth daily. 04/19/21   Heilingoetter, Cassandra  L, PA-C  BIOTIN PO Take 1 tablet by mouth daily.     [provider]  calcium-vitamin D (OSCAL WITH D) 500-200 MG-UNIT tablet Take 1 tablet by mouth daily with breakfast.    [provider]  folic acid (FOLVITE) 1 MG tablet Take 1 tablet (1 mg total) by mouth daily. 04/19/21   Heilingoetter, Cassandra L, PA-C  ibuprofen (ADVIL) 200 MG tablet Take 400 mg by mouth every 6 (six) hours as needed for mild pain or headache. For pain    [provider]  lidocaine-prilocaine (EMLA) cream Apply 1 application topically as needed. 04/19/21   Heilingoetter, Cassandra L, PA-C  pindolol (VISKEN) 10 MG tablet Take 1 tablet (10 mg total) by mouth 2 (two) times daily. 10/31/20   Bhagat, Crista Luria, PA  prochlorperazine (COMPAZINE) 10 MG tablet Take 1 tablet (10 mg total) by mouth every 6 (six) hours as needed. 04/19/21   Heilingoetter, Cassandra L, PA-C  rosuvastatin (CRESTOR) 5 MG tablet Take 5 mg by mouth every Monday, Wednesday, and Friday. 02/21/20   [provider]     Family History  Problem Relation Age of Onset   Stroke Sister    Breast cancer Sister    Breast cancer Daughter 72       metastatic breast ca    Social History   Socioeconomic History   Marital status: Widowed    Spouse name: Not on file   Number of children: 2   Years of education: Not on file   Highest education level: Not on file  Occupational History   Occupation: Retired  Tobacco Use   Smoking status: Former    Pack years: 0.00    Types: Cigarettes    Quit date: 06/17/1972    Years since quitting: 48.9   Smokeless tobacco: Never   Tobacco comments:    quit June 17, 1972  Vaping Use   Vaping Use: Never used  Substance and Sexual Activity   Alcohol use: Yes    Alcohol/week: 0.0 standard drinks    Comment: 2 drinks per month   Drug use: No   Sexual activity: Never    Partners: Male    Birth control/protection: Post-menopausal  Other Topics Concern   Not on file  Social History  Narrative   Not on file   Social Determinants of Health   Financial Resource Strain: Not on file  Food Insecurity: Not on file  Transportation Needs: Not on file  Physical Activity: Not on file  Stress: Not on file  Social Connections: Not on file     Review of Systems denies fever, HA, CP, dyspnea, cough, abd/back pain,N/V or bleeding  Vital Signs: Vitals:   05/01/21 1336  BP: (!) 136/54  Pulse: 60  Resp: 20  Temp: 97.9 F (36.6 C)  SpO2: 96%    LMP 11/19/1983   Physical Exam : awake/alert; chest- sl dim BS rt base, left clear; heart- RRR with + murmur; abd- soft,+BS,NT; no LE edema  Imaging: MM CLIP PLACEMENT LEFT  Result Date: 04/03/2021 CLINICAL DATA:  Confirmation of clip placement after ultrasound-guided core needle biopsy of a mass in the UPPER LEFT breast at the near 12 o'clock location. EXAM: 2D AND 3D DIAGNOSTIC LEFT MAMMOGRAM POST ULTRASOUND BIOPSY COMPARISON:  Previous exam(s). FINDINGS: Tomosynthesis and synthesized  full field CC and mediolateral mammographic images were obtained following ultrasound guided biopsy of a mass in the upper LEFT breast. The ribbon shaped biopsy marking clip is appropriately position within the biopsied mass. Expected post biopsy changes are present without evidence of hematoma. IMPRESSION: Appropriate positioning of the ribbon shaped biopsy marking clip within the biopsied mass in the upper LEFT breast at the near 12 o'clock location. Final Assessment: Post Procedure Mammograms for Marker Placement Electronically Signed   By: Evangeline Dakin M.D.   On: 04/03/2021 14:07   [place] LT BREAST BX W LOC DEV 1ST LESION IMG BX SPEC US GUIDE  Addendum Date: 04/05/2021   ADDENDUM REPORT: 04/05/2021 10:58 ADDENDUM: Pathology revealed DUCTAL CARCINOMA of the LEFT breast, upper, 3cmfn, ribbon clip. The differential diagnosis includes ductal carcinoma involving a papillary lesion and papillary carcinoma. This was found to be concordant by Dr. Peggye Fothergill. Pathology results were discussed with the patient by telephone. The patient reported doing well after the biopsy with tenderness at the site. Post biopsy instructions and care were reviewed and questions were answered. The patient was encouraged to call [facility] for any additional concerns. At the request of Dr. Curt Bears, surgical evaluation has been arranged with Dr. Donnie Mesa at [facility] on Apr 13, 2021. The patient has a recent diagnosis of non-small cell lung cancer and should follow her outlined treatment plan. Pathology results reported by Stacie Acres RN on 04/05/2021. Electronically Signed   By: Evangeline Dakin M.D.   On: 04/05/2021 10:58   Result Date: 04/05/2021 CLINICAL DATA:  87 year old with a recent diagnosis of non-small cell lung carcinoma involving the RIGHT UPPER LOBE, shown on PET-CT to have a hypermetabolic focus in the UPPER LEFT breast. Diagnostic workup demonstrated a lobulated mass at the 12 o'clock position approximately 3 cm from the nipple that measured approximately 1.5 cm. EXAM: ULTRASOUND GUIDED LEFT BREAST CORE NEEDLE BIOPSY COMPARISON:  Previous exam(s). PROCEDURE: I met with the patient and we discussed the procedure of ultrasound-guided biopsy, including benefits and alternatives. We discussed the high likelihood of a successful procedure. We discussed the risks of the procedure, including infection, bleeding, tissue injury, clip migration, and inadequate sampling. Informed written consent was given. The usual time-out protocol was performed immediately prior to the procedure. Lesion quadrant: UPPER breast, near 12 o'clock location. Initial ultrasound evaluation confirmed that the mass in the upper breast is a single lobulated mass rather than 2 adjacent masses as had been questioned on the diagnostic ultrasound. Using sterile technique with chlorhexidine as skin antisepsis, 1% lidocaine and 1% lidocaine with  epinephrine as local anesthetic, under direct ultrasound visualization, a 12 gauge Bard Marquee core needle device placed through needle was used to perform biopsy of the mass in the upper LEFT breast using a lateral approach. At the conclusion of the procedure a ribbon shaped tissue marker clip was deployed into the biopsy cavity. Follow up 2 view mammogram was performed and dictated separately. IMPRESSION: Ultrasound guided biopsy of an indeterminate mass involving the upper LEFT breast. No apparent complications. Electronically Signed: By: Evangeline Dakin M.D. On: 04/03/2021 14:09    Labs:  CBC: Recent Labs    03/01/21 1907 04/02/21 1050 04/19/21 0954 04/26/21 1129  WBC 10.0 7.9 7.6 7.9  HGB 11.5* 12.4 11.9* 11.9*  HCT 37.0 39.5 37.1 36.4  PLT 243 279 263 268    COAGS: No results for input(s): INR, APTT in the last 8760 hours.  BMP:  Recent Labs    06/03/20 1323 10/26/20 1628 12/27/20 1137 01/22/21 0906 03/01/21 1907 04/02/21 1050 04/19/21 0954 04/26/21 1129  NA 139   < > 140   < > 139 141 141 141  K 4.1   < > 4.8   < > 4.3 4.2 4.1 4.2  CL 106   < > 103   < > 107 106 107 106  CO2 23   < > 20   < > _0 GLUCOSE 89   < > 83   < > 91 91 84 82  BUN 13   < > 19   < > _1 CALCIUM 9.3   < > 9.5   < > 9.0 9.4 9.3 9.4  CREATININE 0.79   < > 1.09*   < > 0.85 1.01* 0.88 0.98  GFRNONAA >60   < > 45*   < > >60 54* >60 56*  GFRAA >60  --  52*  --   --   --   --   --    < > = values in this interval not displayed.    LIVER FUNCTION TESTS: Recent Labs    02/22/21 1433 04/02/21 1050 04/19/21 0954 04/26/21 1129  BILITOT 0.4 0.5 0.4 0.5  AST _2 ALT _3 ALKPHOS 79 91 89 82  PROT 6.8 7.0 6.8 7.0  ALBUMIN 3.7 3.7 3.5 3.5    TUMOR MARKERS: No results for input(s): AFPTM, CEA, CA199, CHROMGRNA in the last 8760 hours.  Assessment and Plan: 87 y.o. female ,ex smoker, with PMH sig for depression, diverticulosis, HLD, HTN, hypothyroidism,  sever aortic stenosis and prev SVT. She has also recently been diagnosed with both stage IV lung cancer (likely adenoca) and stage I breast cancer. She presented with large right lower lobe lung mass in addition to right hilar and mediastinal lymphadenopathy as well as bilateral adrenal metastasis in April 2022.She presents today for port a cath placement to assist with treatment.Risks and benefits of image guided port-a-catheter placement was discussed with the patient including, but not limited to bleeding, infection, pneumothorax, or fibrin sheath development and need for additional procedures.  All of the patient's questions were answered, patient is agreeable to proceed. Consent signed and in chart.    Thank you for this interesting consult.  I greatly enjoyed meeting Kim Ward and look forward to participating in their care.  A copy of this report was sent to the requesting provider on this date.  Electronically Signed: D. Rowe Robert, PA-C 05/01/2021, 1:31 PM   I spent a total of  25 minutes   in face to face in clinical consultation, greater than 50% of which was counseling/coordinating care for port a cath placement

## 2021-05-01 NOTE — Procedures (Signed)
Interventional Radiology Procedure Note  Procedure: Placement of a right IJ approach single lumen PowerPort.  Tip is positioned at the superior cavoatrial junction and catheter is ready for immediate use.   Complications: No immediate  EBL: None  Recommendations:  - Ok to shower tomorrow - Do not submerge for 7 days - Routine line care    Signed,  Jalyssa Fleisher K. Jamichael Knotts, MD   

## 2021-05-03 ENCOUNTER — Encounter: Payer: Self-pay | Admitting: *Deleted

## 2021-05-03 ENCOUNTER — Inpatient Hospital Stay: Payer: Medicare PPO

## 2021-05-03 ENCOUNTER — Other Ambulatory Visit: Payer: Self-pay

## 2021-05-03 ENCOUNTER — Inpatient Hospital Stay (HOSPITAL_BASED_OUTPATIENT_CLINIC_OR_DEPARTMENT_OTHER): Payer: Medicare PPO | Admitting: Internal Medicine

## 2021-05-03 DIAGNOSIS — C3411 Malignant neoplasm of upper lobe, right bronchus or lung: Secondary | ICD-10-CM

## 2021-05-03 DIAGNOSIS — Z5112 Encounter for antineoplastic immunotherapy: Secondary | ICD-10-CM | POA: Diagnosis not present

## 2021-05-03 DIAGNOSIS — Z5111 Encounter for antineoplastic chemotherapy: Secondary | ICD-10-CM

## 2021-05-03 LAB — CMP (CANCER CENTER ONLY)
ALT: 11 U/L (ref 0–44)
AST: 14 U/L — ABNORMAL LOW (ref 15–41)
Albumin: 3.4 g/dL — ABNORMAL LOW (ref 3.5–5.0)
Alkaline Phosphatase: 84 U/L (ref 38–126)
Anion gap: 7 (ref 5–15)
BUN: 19 mg/dL (ref 8–23)
CO2: 25 mmol/L (ref 22–32)
Calcium: 9.1 mg/dL (ref 8.9–10.3)
Chloride: 103 mmol/L (ref 98–111)
Creatinine: 0.83 mg/dL (ref 0.44–1.00)
GFR, Estimated: >60 mL/min (ref >60)
Glucose, Bld: 87 mg/dL (ref 70–99)
Potassium: 3.9 mmol/L (ref 3.5–5.1)
Sodium: 135 mmol/L (ref 135–145)
Total Bilirubin: 0.7 mg/dL (ref 0.3–1.2)
Total Protein: 6.6 g/dL (ref 6.5–8.1)

## 2021-05-03 LAB — CBC WITH DIFFERENTIAL (CANCER CENTER ONLY)
Abs Immature Granulocytes: 0.01 10*3/uL (ref 0.00–0.07)
Basophils Absolute: 0 10*3/uL (ref 0.0–0.1)
Basophils Relative: 1 %
Eosinophils Absolute: 0.2 10*3/uL (ref 0.0–0.5)
Eosinophils Relative: 4 %
HCT: 37.1 % (ref 36.0–46.0)
Hemoglobin: 12 g/dL (ref 12.0–15.0)
Immature Granulocytes: 0 %
Lymphocytes Relative: 30 %
Lymphs Abs: 1.3 10*3/uL (ref 0.7–4.0)
MCH: 27.9 pg (ref 26.0–34.0)
MCHC: 32.3 g/dL (ref 30.0–36.0)
MCV: 86.3 fL (ref 80.0–100.0)
Monocytes Absolute: 0.1 10*3/uL (ref 0.1–1.0)
Monocytes Relative: 2 %
Neutro Abs: 2.7 10*3/uL (ref 1.7–7.7)
Neutrophils Relative %: 63 %
Platelet Count: 212 10*3/uL (ref 150–400)
RBC: 4.3 MIL/uL (ref 3.87–5.11)
RDW: 14 % (ref 11.5–15.5)
WBC Count: 4.2 10*3/uL (ref 4.0–10.5)
nRBC: 0 % (ref 0.0–0.2)

## 2021-05-03 MED ORDER — SODIUM CHLORIDE 0.9% FLUSH
10.0000 mL | INTRAVENOUS | Status: AC | PRN
  Administered 2021-05-03: 10 mL
  Filled 2021-05-03: qty 10

## 2021-05-03 MED ORDER — HEPARIN SOD (PORK) LOCK FLUSH 100 UNIT/ML IV SOLN
500.0000 [IU] | INTRAVENOUS | Status: AC | PRN
  Administered 2021-05-03: 500 [IU]
  Filled 2021-05-03: qty 5

## 2021-05-03 NOTE — Progress Notes (Signed)
[facility] [facility] [phone]   [phone]  OFFICE PROGRESS NOTE  Kim Spearing, NP [address] [place] [address]   DIAGNOSIS:  1) Stage IV (T3, N2, M1 C) non-small cell lung carcinoma and probably adenocarcinoma in this patient with remote history of smoking diagnosed in April 2022.  She presented with large right lower lobe lung mass in addition to right hilar and mediastinal lymphadenopathy as well as bilateral adrenal metastasis. Diagnosed in April 2022. 2) Stage I Breast Cancer, ER/PR positive. Diagnosed in April 2022.    DETECTED ALTERATION(S) / BIOMARKER(S)     % CFDNA OR AMPLIFICATION        ASSOCIATED FDA-APPROVED THERAPIES         CLINICAL TRIAL AVAILABILITY [id number]* 0.3%   Olaparib, Rucaparib, Talazoparib Yes CDK12Splice Site SNV 5.3%   Olaparib Yes [id number] 0.3% None     Yes   PRIOR THERAPY: None   CURRENT THERAPY: Palliative systemic chemotherapy with carboplatin for an AUC of 5, Alimta 500 mg per metered squared, Keytruda 200 mg IV every 3 weeks.  First dose expected on 04/26/2021.  Status post 1 cycle.  INTERVAL HISTORY: Kim BRASS87y.o. female returns to the clinic today for follow-up visit accompanied by her daughter.  The patient is started the first cycle of her systemic chemotherapy with carboplatin, Alimta and Keytruda last week.  She is tolerating the treatment well except for 1 day of severe diarrhea which completely resolved at this point.  Her diarrhea started after the placement of the Port-A-Cath.  She also has some erythema and mild tenderness in the Port-A-Cath site.  This was placed 2 days ago.  She denied having any current chest pain but has shortness of breath with exertion with no cough or hemoptysis.  She denied having any fever or chills.  She has no nausea, vomiting, current diarrhea or constipation.  She has no headache or visual changes.  She is here today for evaluation and repeat blood  work.  MEDICAL HISTORY: Past Medical History:  Diagnosis Date   Adenomatous colon polyp    Depression    Tx'd 19 yrs ago when husband passed away   Diverticulosis    Hyperlipidemia    Hypertension    Hypothyroidism    Dr. KDwyane Deefollows pt.--had small nodules but had decreased in size and stopped Synthroid 06/2013   Osteoporosis    Pure hypercholesterolemia    Severe aortic stenosis    SVT (supraventricular tachycardia) (HCC)     ALLERGIES:  is allergic to augmentin [amoxicillin-pot clavulanate], azithromycin, bextra [valdecoxib], cefuroxime, cefuroxime axetil, etodolac, humibid la [guaifenesin], lipitor [atorvastatin calcium], ramipril, risedronate sodium, tramadol, zocor [simvastatin], and niaspan [niacin er].  MEDICATIONS:  Current Outpatient Medications  Medication Sig Dispense Refill   amiodarone (PACERONE) 200 MG tablet TAKE ONE (1) TABLET BY MOUTH EVERY DAY (Patient taking differently: Take 200 mg by mouth daily.) 90 tablet 3   amLODipine (NORVASC) 2.5 MG tablet Take 1 tablet (2.5 mg total) by mouth daily. (Patient taking differently: Take 2.5 mg by mouth at bedtime.) 30 tablet 11   amoxicillin (AMOXIL) 500 MG capsule Take 500 mg by mouth as directed. 4 capsules before dental procedures (Patient not taking: Reported on 04/19/2021)     anastrozole (ARIMIDEX) 1 MG tablet Take 1 tablet (1 mg total) by mouth daily. 30 tablet 2   BIOTIN PO Take 1 tablet by mouth daily.      calcium-vitamin D (OSCAL WITH  D) 500-200 MG-UNIT tablet Take 1 tablet by mouth daily with breakfast.     folic acid (FOLVITE) 1 MG tablet Take 1 tablet (1 mg total) by mouth daily. 30 tablet 2   ibuprofen (ADVIL) 200 MG tablet Take 400 mg by mouth every 6 (six) hours as needed for mild pain or headache. For pain     lidocaine-prilocaine (EMLA) cream Apply 1 application topically as needed. 30 g 2   pindolol (VISKEN) 10 MG tablet Take 1 tablet (10 mg total) by mouth 2 (two) times daily. 60 tablet 6    prochlorperazine (COMPAZINE) 10 MG tablet Take 1 tablet (10 mg total) by mouth every 6 (six) hours as needed. 30 tablet 2   rosuvastatin (CRESTOR) 5 MG tablet Take 5 mg by mouth every Monday, Wednesday, and Friday.     Current Facility-Administered Medications  Medication Dose Route Frequency Provider Last Rate Last Admin   sodium chloride flush (NS) 0.9 % injection 3 mL  3 mL Intravenous Q12H Lelon Perla, MD        SURGICAL HISTORY:  Past Surgical History:  Procedure Laterality Date   BRONCHIAL BIOPSY  03/01/2021   Procedure: BRONCHIAL BIOPSIES;  Surgeon: Garner Nash, DO;  Location: [facility];  Service: Pulmonary;;   BRONCHIAL BRUSHINGS  03/01/2021   Procedure: BRONCHIAL BRUSHINGS;  Surgeon: Garner Nash, DO;  Location: [facility];  Service: Pulmonary;;   BRONCHIAL NEEDLE ASPIRATION BIOPSY  03/01/2021   Procedure: BRONCHIAL NEEDLE ASPIRATION BIOPSIES;  Surgeon: Garner Nash, DO;  Location: [facility];  Service: Pulmonary;;   BRONCHIAL WASHINGS  03/01/2021   Procedure: BRONCHIAL WASHINGS;  Surgeon: Garner Nash, DO;  Location: [facility];  Service: Pulmonary;;   CERVICAL BIOPSY  W/ LOOP ELECTRODE EXCISION  1996   CIN I   COLONOSCOPY  03/2001, 01/2007   2008 - no polyps   DILATION AND CURETTAGE OF UTERUS     ESOPHAGOGASTRODUODENOSCOPY (EGD) WITH PROPOFOL N/A 03/02/2021   Procedure: ESOPHAGOGASTRODUODENOSCOPY (EGD) WITH PROPOFOL;  Surgeon: Jerene Bears, MD;  Location: [facility];  Service: Gastroenterology;  Laterality: N/A;   FOREIGN BODY REMOVAL  03/02/2021   Procedure: FOREIGN BODY REMOVAL;  Surgeon: Jerene Bears, MD;  Location: [facility];  Service: Gastroenterology;;   IR IMAGING GUIDED PORT INSERTION  05/01/2021   LEFT HEART CATH AND CORONARY ANGIOGRAPHY N/A 01/12/2021   Procedure: LEFT HEART CATH AND CORONARY ANGIOGRAPHY;  Surgeon: Sherren Mocha, MD;  Location: [facility];  Service: Cardiovascular;  Laterality: N/A;   TONSILLECTOMY      VIDEO BRONCHOSCOPY WITH ENDOBRONCHIAL ULTRASOUND N/A 03/01/2021   Procedure: VIDEO BRONCHOSCOPY WITH ENDOBRONCHIAL ULTRASOUND;  Surgeon: Garner Nash, DO;  Location: [facility];  Service: Pulmonary;  Laterality: N/A;    REVIEW OF SYSTEMS:  Constitutional: positive for fatigue Eyes: negative Ears, nose, mouth, throat, and face: negative Respiratory: negative Cardiovascular: negative Gastrointestinal: negative Genitourinary:negative Integument/breast: negative Hematologic/lymphatic: negative Musculoskeletal:negative Neurological: negative Behavioral/Psych: negative Endocrine: negative Allergic/Immunologic: negative   PHYSICAL EXAMINATION: General appearance: alert, cooperative, fatigued, and no distress Head: Normocephalic, without obvious abnormality, atraumatic Neck: no adenopathy, no JVD, supple, symmetrical, trachea midline, and thyroid not enlarged, symmetric, no tenderness/mass/nodules Lymph nodes: Cervical, supraclavicular, and axillary nodes normal. Resp: clear to auscultation bilaterally Back: no kyphosis present, symmetric, no curvature. ROM normal. No CVA tenderness. Cardio: regular rate and rhythm, S1, S2 normal, no murmur, click, rub or gallop GI: soft, non-tender; bowel sounds normal; no masses,  no organomegaly Extremities: extremities normal, atraumatic, no cyanosis  or edema Neurologic: Alert and oriented X 3, normal strength and tone. Normal symmetric reflexes. Normal coordination and gait    ECOG PERFORMANCE STATUS: 1 - Symptomatic but completely ambulatory  Blood pressure (!) 117/45, pulse (!) 57, temperature 97.8 F (36.6 C), temperature source Tympanic, resp. rate 18, height _0  (1.651 m), weight 147 lb 14.4 oz (67.1 kg), last menstrual period 11/19/1983, SpO2 98 %.  LABORATORY DATA: Lab Results  Component Value Date   WBC 4.2 05/03/2021   HGB 12.0 05/03/2021   HCT 37.1 05/03/2021   MCV 86.3 05/03/2021   PLT 212 05/03/2021      Chemistry       Component Value Date/Time   NA 135 05/03/2021 1038   NA 140 12/27/2020 1137   K 3.9 05/03/2021 1038   CL 103 05/03/2021 1038   CO2 25 05/03/2021 1038   BUN 19 05/03/2021 1038   BUN 19 12/27/2020 1137   CREATININE 0.83 05/03/2021 1038   CREATININE 0.95 (H) 06/24/2016 0835      Component Value Date/Time   CALCIUM 9.1 05/03/2021 1038   ALKPHOS 84 05/03/2021 1038   AST 14 (L) 05/03/2021 1038   ALT 11 05/03/2021 1038   BILITOT 0.7 05/03/2021 1038       RADIOGRAPHIC STUDIES: MM CLIP PLACEMENT LEFT  Result Date: 04/03/2021 CLINICAL DATA:  Confirmation of clip placement after ultrasound-guided core needle biopsy of a mass in the UPPER LEFT breast at the near 12 o'clock location. EXAM: 2D AND 3D DIAGNOSTIC LEFT MAMMOGRAM POST ULTRASOUND BIOPSY COMPARISON:  Previous exam(s). FINDINGS: Tomosynthesis and synthesized full field CC and mediolateral mammographic images were obtained following ultrasound guided biopsy of a mass in the upper LEFT breast. The ribbon shaped biopsy marking clip is appropriately position within the biopsied mass. Expected post biopsy changes are present without evidence of hematoma. IMPRESSION: Appropriate positioning of the ribbon shaped biopsy marking clip within the biopsied mass in the upper LEFT breast at the near 12 o'clock location. Final Assessment: Post Procedure Mammograms for Marker Placement Electronically Signed   By: Evangeline Dakin M.D.   On: 04/03/2021 14:07   [place] LT BREAST BX W LOC DEV 1ST LESION IMG BX SPEC US GUIDE  Addendum Date: 04/05/2021   ADDENDUM REPORT: 04/05/2021 10:58 ADDENDUM: Pathology revealed DUCTAL CARCINOMA of the LEFT breast, upper, 3cmfn, ribbon clip. The differential diagnosis includes ductal carcinoma involving a papillary lesion and papillary carcinoma. This was found to be concordant by Dr. Peggye Fothergill. Pathology results were discussed with the patient by telephone. The patient reported doing well after the biopsy with tenderness  at the site. Post biopsy instructions and care were reviewed and questions were answered. The patient was encouraged to call [facility] for any additional concerns. At the request of Dr. Curt Bears, surgical evaluation has been arranged with Dr. Donnie Mesa at [facility] on Apr 13, 2021. The patient has a recent diagnosis of non-small cell lung cancer and should follow her outlined treatment plan. Pathology results reported by Stacie Acres RN on 04/05/2021. Electronically Signed   By: Evangeline Dakin M.D.   On: 04/05/2021 10:58   Result Date: 04/05/2021 CLINICAL DATA:  87 year old with a recent diagnosis of non-small cell lung carcinoma involving the RIGHT UPPER LOBE, shown on PET-CT to have a hypermetabolic focus in the UPPER LEFT breast. Diagnostic workup demonstrated a lobulated mass at the 12 o'clock position approximately 3 cm from the nipple that measured approximately 1.5 cm. EXAM: ULTRASOUND  GUIDED LEFT BREAST CORE NEEDLE BIOPSY COMPARISON:  Previous exam(s). PROCEDURE: I met with the patient and we discussed the procedure of ultrasound-guided biopsy, including benefits and alternatives. We discussed the high likelihood of a successful procedure. We discussed the risks of the procedure, including infection, bleeding, tissue injury, clip migration, and inadequate sampling. Informed written consent was given. The usual time-out protocol was performed immediately prior to the procedure. Lesion quadrant: UPPER breast, near 12 o'clock location. Initial ultrasound evaluation confirmed that the mass in the upper breast is a single lobulated mass rather than 2 adjacent masses as had been questioned on the diagnostic ultrasound. Using sterile technique with chlorhexidine as skin antisepsis, 1% lidocaine and 1% lidocaine with epinephrine as local anesthetic, under direct ultrasound visualization, a 12 gauge Bard Marquee core needle device placed through needle was  used to perform biopsy of the mass in the upper LEFT breast using a lateral approach. At the conclusion of the procedure a ribbon shaped tissue marker clip was deployed into the biopsy cavity. Follow up 2 view mammogram was performed and dictated separately. IMPRESSION: Ultrasound guided biopsy of an indeterminate mass involving the upper LEFT breast. No apparent complications. Electronically Signed: By: Evangeline Dakin M.D. On: 04/03/2021 14:09   IR IMAGING GUIDED PORT INSERTION  Result Date: 05/01/2021 INDICATION: 87 year old female with stage IV lung cancer originating in the right upper lobe. She presents for port catheter placement to establish durable venous access. EXAM: IMPLANTED PORT A CATH PLACEMENT WITH ULTRASOUND AND FLUOROSCOPIC GUIDANCE MEDICATIONS: None. ANESTHESIA/SEDATION: Versed 1 mg IV; Fentanyl 50 mcg IV; Moderate Sedation Time:  22 minutes The patient was continuously monitored during the procedure by the interventional radiology nurse under my direct supervision. FLUOROSCOPY TIME:  0 minutes, 12 seconds (2 mGy) COMPLICATIONS: None immediate. PROCEDURE: The right neck and chest was prepped with chlorhexidine, and draped in the usual sterile fashion using maximum barrier technique (cap and mask, sterile gown, sterile gloves, large sterile sheet, hand hygiene and cutaneous antiseptic). Local anesthesia was attained by infiltration with 1% lidocaine with epinephrine. Ultrasound demonstrated patency of the right internal jugular vein, and this was documented with an image. Under real-time ultrasound guidance, this vein was accessed with a 21 gauge micropuncture needle and image documentation was performed. A small dermatotomy was made at the access site with an 11 scalpel. A 0.018" wire was advanced into the SVC and the access needle exchanged for a 80F micropuncture vascular sheath. The 0.018" wire was then removed and a 0.035" wire advanced into the IVC. An appropriate location for the  subcutaneous reservoir was selected below the clavicle and an incision was made through the skin and underlying soft tissues. The subcutaneous tissues were then dissected using a combination of blunt and sharp surgical technique and a pocket was formed. A single lumen power injectable portacatheter was then tunneled through the subcutaneous tissues from the pocket to the dermatotomy and the port reservoir placed within the subcutaneous pocket. The venous access site was then serially dilated and a peel away vascular sheath placed over the wire. The wire was removed and the port catheter advanced into position under fluoroscopic guidance. The catheter tip is positioned in the superior cavoatrial junction. This was documented with a spot image. The portacatheter was then tested and found to flush and aspirate well. The port was flushed with saline followed by 100 units/mL heparinized saline. The pocket was then closed in two layers using first subdermal inverted interrupted absorbable sutures followed by a running subcuticular suture.  The epidermis was then sealed with Dermabond. The dermatotomy at the venous access site was also closed with Dermabond. IMPRESSION: Successful placement of a right IJ approach Power Port with ultrasound and fluoroscopic guidance. The catheter is ready for use. Electronically Signed   By: Jacqulynn Cadet M.D.   On: 05/01/2021 16:19    ASSESSMENT AND PLAN: This is a very pleasant 87 years old white female with a stage IV non-small cell lung cancer, adenocarcinoma diagnosed in April 2022 and presented with large right lower lobe lung mass in addition to right hilar and mediastinal lymphadenopathy with bilateral adrenal metastasis.  The patient was also diagnosed with a stage I breast cancer involving the left breast in May 2022. She is currently undergoing treatment with systemic chemotherapy with carboplatin for AUC of 5, Alimta 500 Mg/M2 and Keytruda 200 Mg IV every 3 weeks status  post 1 cycle.  She tolerated the first week of her treatment well except for the 1 day of severe diarrhea as well as fatigue and generalized weakness. Her diarrhea completely resolved at this point. The patient also has a Port-A-Cath with some erythema and tenderness but this was placed few days ago.  We will continue to monitor closely for any signs of infection. For the history of breast cancer, she is currently on treatment with Arimidex 1 mg p.o. daily and tolerating it well. I recommended for the patient to continue her current treatment with systemic chemotherapy and she is expected to start cycle #2 in 2 weeks if she has no other concerning issues in the interval. The patient was also advised to call immediately if she has any concerning complaints in the interval.  The patient voices understanding of current disease status and treatment options and is in agreement with the current care plan.  All questions were answered. The patient knows to call the clinic with any problems, questions or concerns. We can certainly see the patient much sooner if necessary. The total time spent in the appointment was 30 minutes.  Disclaimer: This note was dictated with voice recognition software. Similar sounding words can inadvertently be transcribed and may not be corrected upon review.

## 2021-05-09 ENCOUNTER — Encounter: Payer: Self-pay | Admitting: Internal Medicine

## 2021-05-09 DIAGNOSIS — C3411 Malignant neoplasm of upper lobe, right bronchus or lung: Secondary | ICD-10-CM

## 2021-05-09 DIAGNOSIS — C50912 Malignant neoplasm of unspecified site of left female breast: Secondary | ICD-10-CM

## 2021-05-09 DIAGNOSIS — C7970 Secondary malignant neoplasm of unspecified adrenal gland: Secondary | ICD-10-CM

## 2021-05-10 ENCOUNTER — Other Ambulatory Visit: Payer: Medicare PPO

## 2021-05-10 NOTE — Telephone Encounter (Signed)
I spoke with pts daughter and shared that her circumstances are completely understood but the pt needs to be very closely monitored while on tx. I shared that sometimes assisted living facilities have RN/phlebotomist that can draw the labs for the pt but she would need to check with the pts facility to see if this is an option and also if they can draw pts labs from her port as all places aren't do that. Pts daughter expressed understanding of this and advised she will check with the pts facility and let us know. I have also provided her with our fax number for them to fax the results if they are able to.

## 2021-05-10 NOTE — Telephone Encounter (Signed)
Pt daughter called back and advised the pts facility does not provide this service and requests for home health referral to draw her labs weekly as the pt really is not able to come weekly.

## 2021-05-10 NOTE — Telephone Encounter (Signed)
Discussed with Kim Ward who advised we can refer the pt to social work for assistance with transportation. Referral has been sent and I have LM detailed message for pts daughter, Kim Ward.  Pts physical address is as follows:  [address]: [place],  [id number]

## 2021-05-11 ENCOUNTER — Encounter: Payer: Self-pay | Admitting: General Practice

## 2021-05-11 ENCOUNTER — Encounter: Payer: Self-pay | Admitting: *Deleted

## 2021-05-11 NOTE — Progress Notes (Signed)
I received a message from [facility]. Patient's daughter would like her mother to get her lab work at the facility instead of coming here every week.  I called facility to see how this is completed but was unable to reach anyone. I left vm message with my name and phone number to call.

## 2021-05-11 NOTE — Progress Notes (Addendum)
[facility] CSW Progress Notes  Referral received from medical oncology - per referral, patient has transportation concerns.  Called patient, she lives in an independent apartment at [facility], takes her meals in one of several communal dining areas.  She reports that MD is concerned about her nutritional intake, but she is not quite sure what   She has transportation to/from appointments through [facility] - it is offered at no cost and she is aware of how to schedule rides.  She intends to use this transportation to/from chemotherapy.  Patient does have her own car and drives short distances (grocery store, errands around her apartment).    She is asking about a Theme park manager" who would be able to "give injections in my home". Patient says her daughter, Mickel Baas, is asking about any possibilities for medical issues to be cared for in the home.  CSW will also communicate this request to MD's nurse and nurse navigator.  VM left for daughter, Marlan Palau, asking her to call us to give further information about what she is hoping her mother could receive from "community nurse."   Call from daughter, the request is for a "community nurse to draw labs from the port on weeks that she is not here at [facility]."  Per patient, the RNs at [facility] will not draw labs from the port.  Desk RN notified.    Edwyna Shell, LCSW Clinical Social Worker Phone:  [phone]

## 2021-05-14 ENCOUNTER — Encounter: Payer: Self-pay | Admitting: *Deleted

## 2021-05-14 NOTE — Progress Notes (Signed)
I received a message from [facility] that Ms. Reimann daughter would like to see if patient's labs can be drawn at her facility. I called and spoke with the clinical team at [facility].  They are unable to draw labs from port. They updated that they provide transportation to the cancer center to get labs.  I called home health but they were unsure if they drew labs from the port. I called the daughter to update but was unable to reach or leave vm message.

## 2021-05-14 NOTE — Progress Notes (Signed)
[facility] [facility] OFFICE PROGRESS NOTE  Francine Graven D, NP [address] [place] [address]  DIAGNOSIS: 1) Stage IV (T3, N2, M1 C) non-small cell lung carcinoma and probably adenocarcinoma in this patient with remote history of smoking diagnosed in April 2022.  She presented with large right lower lobe lung mass in addition to right hilar and mediastinal lymphadenopathy as well as bilateral adrenal metastasis. Diagnosed in April 2022. 2) Stage I Breast Cancer, ER/PR positive. Diagnosed in April 2022.  DETECTED ALTERATION(S) / BIOMARKER(S)     % CFDNA OR AMPLIFICATION        ASSOCIATED FDA-APPROVED THERAPIES         CLINICAL TRIAL AVAILABILITY [id number]* 0.3%   Olaparib, Rucaparib, Talazoparib Yes CDK12Splice Site SNV 4.1%   Olaparib Yes [id number] 0.3% None     Yes    PRIOR THERAPY: None  CURRENT THERAPY:   1) Palliative systemic chemotherapy with carboplatin for an AUC of 5, Alimta 500 mg per metered squared, Keytruda 200 mg IV every 3 weeks.  First dose expected on 04/26/2021. Dose reduced to carboplatin for an AUC of 4 and Alimta 400 mg/m2 starting from cycle #2.  2)  Arimidex 1 mg p.o. daily, started in June 2022.   INTERVAL HISTORY: FLORESTINE CARMICAL 87 y.o. female returns to the clinic today for a follow up visit accompanied by her daughter. The patient was recently diagnosed with breast cancer and stage IV lung cancer. The plan is to proceed with systemic chemotherapy for her lung cancer for 4 cycles. Once she completes this, she will be on matintainace alimta and keytruda IV every 3 weeks. At that time, we will reconnect her with her breast cancer team for management. For now, she is on armidex for her breast cancer. They are unsure if she has started taking this yet.   She is status post her first cycle of treatment and notes some generalized weakness, fatigue, and listlisness. She lives in independent living. The patient states that she has a more challenging  time "tidying" up the house. She is independent with doing her laundry but the fatigue has made it hard to do her activities of daily living. The patient states that once she wakes up, she does not spend anytime of the day in bed. Sometimes she might doze off and take a nap if she is sitting in her recliner. The patient states she walks 1x per week and spends most of her time doing chores, visiting with friends, and reading. The patient states she eats 3 meals per day and finishes approximately 75% of her plate. She averages <1 boost/ensure daily. She also has some nausea and aversion to certain smells but has not taken her anti-emetic. Her daughter believes she is eating less than this and is concened about her fatigue, weakness, nausea, and decreased appetite. Her daughter is concerned about her proceeding with treatment and would like her mother to consider stopping treatment. The patient's daughter wants to know if her mother will ever feel better. The patient reportedly told her daugther that she has not felt well and that this is the worst she has felt in some time.    The patient's family is having concerns with getting her to appointments for her weekly labs. They mutually know someone with a home health agency that is able to do weekly labs through her port-a-cath. They will reach out to them to see if they take the patient's insurance. The patient's independent living can arrange  for a driver but the patient does not have anyone to accompany her into her appointments.   The patient denies any fevers, chills, or night sweats. Her breathing is stable. She denies chest pain, shortness of breath, cough, or hemoptyosis. She has nausea but denies vomiting. She had a 12 hour episode of diarrhea 3-4 days after her first chemotherapy treatment but this has resolved. She has lomotil at home if needed. She denies any more diarrhea. She denies headaches or new visual changes. She was having ongoing visual concerns  which predates her cancer diagnosis. She denies rashes or skin changes.   When asked if she would like to proceed with treatment, the patient was clear that she would like to continue on treatment with management of her symptoms. She is here today for evaluation before starting cycle #2 of her treatment tomorrow.    MEDICAL HISTORY: Past Medical History:  Diagnosis Date   Adenomatous colon polyp    Depression    Tx'd 19 yrs ago when husband passed away   Diverticulosis    Hyperlipidemia    Hypertension    Hypothyroidism    Dr. Dwyane Dee follows pt.--had small nodules but had decreased in size and stopped Synthroid 06/2013   Osteoporosis    Pure hypercholesterolemia    Severe aortic stenosis    SVT (supraventricular tachycardia) (HCC)     ALLERGIES:  is allergic to augmentin [amoxicillin-pot clavulanate], azithromycin, bextra [valdecoxib], cefuroxime, cefuroxime axetil, etodolac, humibid la [guaifenesin], lipitor [atorvastatin calcium], ramipril, risedronate sodium, tramadol, zocor [simvastatin], and niaspan [niacin er].  MEDICATIONS:  Current Outpatient Medications  Medication Sig Dispense Refill   amiodarone (PACERONE) 200 MG tablet TAKE ONE (1) TABLET BY MOUTH EVERY DAY (Patient taking differently: Take 200 mg by mouth daily.) 90 tablet 3   amLODipine (NORVASC) 2.5 MG tablet Take 1 tablet (2.5 mg total) by mouth daily. (Patient taking differently: Take 2.5 mg by mouth at bedtime.) 30 tablet 11   amoxicillin (AMOXIL) 500 MG capsule Take 500 mg by mouth as directed. 4 capsules before dental procedures (Patient not taking: Reported on 04/19/2021)     anastrozole (ARIMIDEX) 1 MG tablet Take 1 tablet (1 mg total) by mouth daily. 30 tablet 2   BIOTIN PO Take 1 tablet by mouth daily.      calcium-vitamin D (OSCAL WITH D) 500-200 MG-UNIT tablet Take 1 tablet by mouth daily with breakfast.     folic acid (FOLVITE) 1 MG tablet Take 1 tablet (1 mg total) by mouth daily. 30 tablet 2   ibuprofen  (ADVIL) 200 MG tablet Take 400 mg by mouth every 6 (six) hours as needed for mild pain or headache. For pain     lidocaine-prilocaine (EMLA) cream Apply 1 application topically as needed. 30 g 2   pindolol (VISKEN) 10 MG tablet Take 1 tablet (10 mg total) by mouth 2 (two) times daily. 60 tablet 6   prochlorperazine (COMPAZINE) 10 MG tablet Take 1 tablet (10 mg total) by mouth every 6 (six) hours as needed. 30 tablet 2   rosuvastatin (CRESTOR) 5 MG tablet Take 5 mg by mouth every Monday, Wednesday, and Friday.     Current Facility-Administered Medications  Medication Dose Route Frequency Provider Last Rate Last Admin   sodium chloride flush (NS) 0.9 % injection 3 mL  3 mL Intravenous Q12H Lelon Perla, MD        SURGICAL HISTORY:  Past Surgical History:  Procedure Laterality Date   BRONCHIAL BIOPSY  03/01/2021   Procedure:  BRONCHIAL BIOPSIES;  Surgeon: Garner Nash, DO;  Location: [facility];  Service: Pulmonary;;   BRONCHIAL BRUSHINGS  03/01/2021   Procedure: BRONCHIAL BRUSHINGS;  Surgeon: Garner Nash, DO;  Location: [facility];  Service: Pulmonary;;   BRONCHIAL NEEDLE ASPIRATION BIOPSY  03/01/2021   Procedure: BRONCHIAL NEEDLE ASPIRATION BIOPSIES;  Surgeon: Garner Nash, DO;  Location: [facility];  Service: Pulmonary;;   BRONCHIAL WASHINGS  03/01/2021   Procedure: BRONCHIAL WASHINGS;  Surgeon: Garner Nash, DO;  Location: [facility];  Service: Pulmonary;;   CERVICAL BIOPSY  W/ LOOP ELECTRODE EXCISION  1996   CIN I   COLONOSCOPY  03/2001, 01/2007   2008 - no polyps   DILATION AND CURETTAGE OF UTERUS     ESOPHAGOGASTRODUODENOSCOPY (EGD) WITH PROPOFOL N/A 03/02/2021   Procedure: ESOPHAGOGASTRODUODENOSCOPY (EGD) WITH PROPOFOL;  Surgeon: Jerene Bears, MD;  Location: [facility];  Service: Gastroenterology;  Laterality: N/A;   FOREIGN BODY REMOVAL  03/02/2021   Procedure: FOREIGN BODY REMOVAL;  Surgeon: Jerene Bears, MD;  Location: [facility];  Service:  Gastroenterology;;   IR IMAGING GUIDED PORT INSERTION  05/01/2021   LEFT HEART CATH AND CORONARY ANGIOGRAPHY N/A 01/12/2021   Procedure: LEFT HEART CATH AND CORONARY ANGIOGRAPHY;  Surgeon: Sherren Mocha, MD;  Location: [facility];  Service: Cardiovascular;  Laterality: N/A;   TONSILLECTOMY     VIDEO BRONCHOSCOPY WITH ENDOBRONCHIAL ULTRASOUND N/A 03/01/2021   Procedure: VIDEO BRONCHOSCOPY WITH ENDOBRONCHIAL ULTRASOUND;  Surgeon: Garner Nash, DO;  Location: [facility];  Service: Pulmonary;  Laterality: N/A;    REVIEW OF SYSTEMS:   Review of Systems  Constitutional: Positive for intermittent fatigue, generalized weakness, appetite change, and 3lb weight loss. Negative for chills and fever.  HENT: Negative for mouth sores, nosebleeds, sore throat and trouble swallowing.   Eyes: Negative for eye problems and icterus.  Respiratory: Negative for cough, hemoptysis, shortness of breath and wheezing.   Cardiovascular: Negative for chest pain and leg swelling.  Gastrointestinal: Positive for intermittent nausea. Negative for abdominal pain, constipation, diarrhea, and vomiting.  Genitourinary: Negative for bladder incontinence, difficulty urinating, dysuria, frequency and hematuria.   Musculoskeletal: Negative for back pain, gait problem, neck pain and neck stiffness.  Skin: Negative for itching and rash.  Neurological: Negative for dizziness, extremity weakness, gait problem, headaches, light-headedness and seizures.  Hematological: Negative for adenopathy. Does not bruise/bleed easily.  Psychiatric/Behavioral: Negative for confusion, depression and sleep disturbance. The patient is not nervous/anxious.     PHYSICAL EXAMINATION:  Blood pressure (!) 124/58, pulse (!) 101, temperature (!) 97.5 F (36.4 C), temperature source Tympanic, resp. rate 18, height 5\' 5"  (1.651 m), weight 144 lb 9.6 oz (65.6 kg), last menstrual period 11/19/1983, SpO2 97 %.  ECOG PERFORMANCE STATUS:  1  Physical Exam  Constitutional: Oriented to person, place, and time and thin appearing female and appears younger than stated age and in no distress.  HENT: Head: Normocephalic and atraumatic. Mouth/Throat: Oropharynx is clear and moist. No oropharyngeal exudate. Eyes: Conjunctivae are normal. Right eye exhibits no discharge. Left eye exhibits no discharge. No scleral icterus. Neck: Normal range of motion. Neck supple. Cardiovascular: Normal rate, regular rhythm, normal heart sounds and intact distal pulses.   Pulmonary/Chest: Effort normal and breath sounds normal. No respiratory distress. No wheezes. No rales. Abdominal: Soft. Bowel sounds are normal. Exhibits no distension and no mass. There is no tenderness.  Musculoskeletal: Normal range of motion. Exhibits no edema.  Lymphadenopathy:    No cervical adenopathy.  Neurological: Alert and oriented to person, place, and time. Exhibits muscle wasting. Gait normal. Coordination normal. Skin: Skin is warm and dry. No rash noted. Not diaphoretic. No erythema. No pallor.  Psychiatric: Mood  and judgment normal. She has some mild memory deficits.  Vitals reviewed.  LABORATORY DATA: Lab Results  Component Value Date   WBC 7.2 05/17/2021   HGB 11.0 (L) 05/17/2021   HCT 33.5 (L) 05/17/2021   MCV 87.0 05/17/2021   PLT 522 (H) 05/17/2021      Chemistry      Component Value Date/Time   NA 137 05/17/2021 1014   NA 140 12/27/2020 1137   K 3.9 05/17/2021 1014   CL 102 05/17/2021 1014   CO2 26 05/17/2021 1014   BUN 16 05/17/2021 1014   BUN 19 12/27/2020 1137   CREATININE 0.86 05/17/2021 1014   CREATININE 0.95 (H) 06/24/2016 0835      Component Value Date/Time   CALCIUM 9.4 05/17/2021 1014   ALKPHOS 139 (H) 05/17/2021 1014   AST 35 05/17/2021 1014   ALT 48 (H) 05/17/2021 1014   BILITOT 0.4 05/17/2021 1014       RADIOGRAPHIC STUDIES:  IR IMAGING GUIDED PORT INSERTION  Result Date: 05/01/2021 INDICATION: 87 year old female  with stage IV lung cancer originating in the right upper lobe. She presents for port catheter placement to establish durable venous access. EXAM: IMPLANTED PORT A CATH PLACEMENT WITH ULTRASOUND AND FLUOROSCOPIC GUIDANCE MEDICATIONS: None. ANESTHESIA/SEDATION: Versed 1 mg IV; Fentanyl 50 mcg IV; Moderate Sedation Time:  22 minutes The patient was continuously monitored during the procedure by the interventional radiology nurse under my direct supervision. FLUOROSCOPY TIME:  0 minutes, 12 seconds (2 mGy) COMPLICATIONS: None immediate. PROCEDURE: The right neck and chest was prepped with chlorhexidine, and draped in the usual sterile fashion using maximum barrier technique (cap and mask, sterile gown, sterile gloves, large sterile sheet, hand hygiene and cutaneous antiseptic). Local anesthesia was attained by infiltration with 1% lidocaine with epinephrine. Ultrasound demonstrated patency of the right internal jugular vein, and this was documented with an image. Under real-time ultrasound guidance, this vein was accessed with a 21 gauge micropuncture needle and image documentation was performed. A small dermatotomy was made at the access site with an 11 scalpel. A 0.018" wire was advanced into the SVC and the access needle exchanged for a 77F micropuncture vascular sheath. The 0.018" wire was then removed and a 0.035" wire advanced into the IVC. An appropriate location for the subcutaneous reservoir was selected below the clavicle and an incision was made through the skin and underlying soft tissues. The subcutaneous tissues were then dissected using a combination of blunt and sharp surgical technique and a pocket was formed. A single lumen power injectable portacatheter was then tunneled through the subcutaneous tissues from the pocket to the dermatotomy and the port reservoir placed within the subcutaneous pocket. The venous access site was then serially dilated and a peel away vascular sheath placed over the wire.  The wire was removed and the port catheter advanced into position under fluoroscopic guidance. The catheter tip is positioned in the superior cavoatrial junction. This was documented with a spot image. The portacatheter was then tested and found to flush and aspirate well. The port was flushed with saline followed by 100 units/mL heparinized saline. The pocket was then closed in two layers using first subdermal inverted interrupted absorbable sutures followed by a running subcuticular suture. The epidermis was then sealed with Dermabond. The dermatotomy at the  venous access site was also closed with Dermabond. IMPRESSION: Successful placement of a right IJ approach Power Port with ultrasound and fluoroscopic guidance. The catheter is ready for use. Electronically Signed   By: Jacqulynn Cadet M.D.   On: 05/01/2021 16:19     ASSESSMENT/PLAN:  This is a very pleasant 87 year old Caucasian female with: 1) Stage IV (T3, N2, M1 C) non-small cell lung carcinoma and probably adenocarcinoma in this patient with remote history of smoking diagnosed in April 2022.  She presented with large right lower lobe lung mass in addition to right hilar and mediastinal lymphadenopathy as well as bilateral adrenal metastasis. Diagnosed in April 2022. 2) Stage I Breast Cancer, ER/PR positive. Diagnosed in April 2022.   She is currently undergoing treatment with systemic chemotherapy with carboplatin for AUC of 5, Alimta 500 Mg/M2 and Keytruda 200 Mg IV every 3 weeks status post 1 cycle. She had some fatigue and nausea with her first cycle of treatment.   For the history of breast cancer, she is currently on treatment with Arimidex 1 mg p.o. daily. The patient is not sure if she started this yet. She was instructed to check and call me back to ensure that she does not need me to send another prescription. Once she starts maintenance treatment for her lung cancer, we will refer her to her surgeon for consideration of surgical  intervention for her breast cancer.   The patient was seen with Dr. Julien Nordmann. Dr. Julien Nordmann gave the patient to option of continuing on her current treatment, continuing on her current treatment with dose reductions, or discontinuing treatment and proceeding with palliative care/hospice. Dr. Julien Nordmann answered her questions to her satisfaction. The patient was clear that she would like to continue on her treatment. She will proceed with cycle #2 tomorrow as scheduled; however, Dr. Julien Nordmann will reduce the dose of Alimta to 400 mg/m2 and carboplatin to an AUC to see if that helps with tolerability.   We will also arrange for a restaging CT scan of the chest, abdomen, and pelvis prior to her next cycle of treatment to assess for treatment response. If no benefit from treatment based on the scan, this information can help guide the patient to make a decision if she would like to continue with treatment or not.   We will see her back for a follow up visit in 3 weeks for evaluation before starting cycle #3 and to review her scan results.   I strongly encouraged the patient to take her anti-emetic if she is feeling nauseous. Also, if nausea is playing a role in her decreased appetite, I encouraged her to take her anti-emetic prior to eating. I will refer her to nutrition to see if they can identify areas in which she can improve her intake of calories. She also was encouraged to dry to drink 2-3 ensures/boost per day.   If she develops diarrhea, she was instructed to use imodium. She also has a prescription for lomotil if she needs better control of her nausea.   The patient will reach out to the home health agency to see if they are able to perform her weekly labs. If so, we can fax over her CBC and CMP orders.   The patient was advised to call immediately if she has any concerning symptoms in the interval. The patient voices understanding of current disease status and treatment options and is in agreement with  the current care plan. All questions were answered. The patient knows to call  the clinic with any problems, questions or concerns. We can certainly see the patient much sooner if necessary     Orders Placed This Encounter  Procedures   CT Chest W Contrast    Standing Status:   Future    Standing Expiration Date:   05/17/2022    Order Specific Question:   If indicated for the ordered procedure, I authorize the administration of contrast media per Radiology protocol    Answer:   Yes    Order Specific Question:   Preferred imaging location?    Answer:   [facility]   CT Abdomen Pelvis W Contrast    Standing Status:   Future    Standing Expiration Date:   05/17/2022    Order Specific Question:   If indicated for the ordered procedure, I authorize the administration of contrast media per Radiology protocol    Answer:   Yes    Order Specific Question:   Preferred imaging location?    Answer:   [facility]    Order Specific Question:   Is Oral Contrast requested for this exam?    Answer:   Yes, Per Radiology protocol   TSH    Standing Status:   Standing    Number of Occurrences:   3    Standing Expiration Date:   05/17/2022       Tobe Sos Keyry Iracheta, PA-C 05/17/21  ADDENDUM: Hematology/Oncology Attending: I had a face-to-face encounter with the patient today.  I reviewed her records, labs and recommended his care plan.  This is a very pleasant 87 years old white female diagnosed with a stage IV (T3, N2, M1 C) non-small cell lung cancer, adenocarcinoma in April 2022 as well as a stage I breast cancer of the left breast also in April 2022.  The patient is currently undergoing systemic chemotherapy with carboplatin, Alimta and Keytruda status post 1 cycle.  She has a rough time with the first cycle of her treatment with increasing fatigue and weakness.  She also has 1 day of diarrhea that resolved spontaneously.  She sleeps a lot according to her daughter.  Her  daughter is concerned about continuing the treatment for her mother and she will would like her mother to consider stopping the treatment.  I had a lengthy discussion with the patient and her daughter about her current condition, prognosis and treatment options.  I gave the patient again the option of palliative care and hospice referral but the patient would like to continue treatment. I recommended for her to proceed with cycle #2 tomorrow as planned but I will reduce the dose of carboplatin to AUC of 4 and Alimta to 400 Mg/M2 every 3 weeks in addition to the standard dose of Keytruda. We will repeat CT scan of the chest, abdomen pelvis before starting cycle #3 to see if the patient is benefiting from this treatment before making any further decision. The patient and her daughter agreed to the current plan. She was advised to call immediately if she has any other concerning symptoms in the interval. The total time spent in the appointment was 40 minutes.  Disclaimer: This note was dictated with voice recognition software. Similar sounding words can inadvertently be transcribed and may be missed upon review. Eilleen Kempf, MD 05/17/21

## 2021-05-16 ENCOUNTER — Ambulatory Visit: Payer: Medicare PPO | Admitting: Pulmonary Disease

## 2021-05-16 ENCOUNTER — Institutional Professional Consult (permissible substitution): Payer: Medicare PPO | Admitting: Pulmonary Disease

## 2021-05-16 ENCOUNTER — Encounter: Payer: Self-pay | Admitting: Internal Medicine

## 2021-05-17 ENCOUNTER — Inpatient Hospital Stay: Payer: Medicare PPO

## 2021-05-17 ENCOUNTER — Other Ambulatory Visit: Payer: Self-pay

## 2021-05-17 ENCOUNTER — Inpatient Hospital Stay: Payer: Medicare PPO | Admitting: Physician Assistant

## 2021-05-17 VITALS — BP 124/58 | HR 101 | Temp 97.5°F | Resp 18 | Ht 65.0 in | Wt 144.6 lb

## 2021-05-17 DIAGNOSIS — Z5112 Encounter for antineoplastic immunotherapy: Secondary | ICD-10-CM

## 2021-05-17 DIAGNOSIS — Z170 Estrogen receptor positive status [ER+]: Secondary | ICD-10-CM | POA: Diagnosis not present

## 2021-05-17 DIAGNOSIS — Z5111 Encounter for antineoplastic chemotherapy: Secondary | ICD-10-CM

## 2021-05-17 DIAGNOSIS — C3410 Malignant neoplasm of upper lobe, unspecified bronchus or lung: Secondary | ICD-10-CM

## 2021-05-17 DIAGNOSIS — C50912 Malignant neoplasm of unspecified site of left female breast: Secondary | ICD-10-CM

## 2021-05-17 DIAGNOSIS — Z95828 Presence of other vascular implants and grafts: Secondary | ICD-10-CM | POA: Insufficient documentation

## 2021-05-17 DIAGNOSIS — C3411 Malignant neoplasm of upper lobe, right bronchus or lung: Secondary | ICD-10-CM

## 2021-05-17 LAB — CBC WITH DIFFERENTIAL (CANCER CENTER ONLY)
Abs Immature Granulocytes: 0.05 10*3/uL (ref 0.00–0.07)
Basophils Absolute: 0 10*3/uL (ref 0.0–0.1)
Basophils Relative: 1 %
Eosinophils Absolute: 0.2 10*3/uL (ref 0.0–0.5)
Eosinophils Relative: 3 %
HCT: 33.5 % — ABNORMAL LOW (ref 36.0–46.0)
Hemoglobin: 11 g/dL — ABNORMAL LOW (ref 12.0–15.0)
Immature Granulocytes: 1 %
Lymphocytes Relative: 16 %
Lymphs Abs: 1.2 10*3/uL (ref 0.7–4.0)
MCH: 28.6 pg (ref 26.0–34.0)
MCHC: 32.8 g/dL (ref 30.0–36.0)
MCV: 87 fL (ref 80.0–100.0)
Monocytes Absolute: 1.2 10*3/uL — ABNORMAL HIGH (ref 0.1–1.0)
Monocytes Relative: 16 %
Neutro Abs: 4.6 10*3/uL (ref 1.7–7.7)
Neutrophils Relative %: 63 %
Platelet Count: 522 10*3/uL — ABNORMAL HIGH (ref 150–400)
RBC: 3.85 MIL/uL — ABNORMAL LOW (ref 3.87–5.11)
RDW: 14.6 % (ref 11.5–15.5)
WBC Count: 7.2 10*3/uL (ref 4.0–10.5)
nRBC: 0 % (ref 0.0–0.2)

## 2021-05-17 LAB — CMP (CANCER CENTER ONLY)
ALT: 48 U/L — ABNORMAL HIGH (ref 0–44)
AST: 35 U/L (ref 15–41)
Albumin: 2.7 g/dL — ABNORMAL LOW (ref 3.5–5.0)
Alkaline Phosphatase: 139 U/L — ABNORMAL HIGH (ref 38–126)
Anion gap: 9 (ref 5–15)
BUN: 16 mg/dL (ref 8–23)
CO2: 26 mmol/L (ref 22–32)
Calcium: 9.4 mg/dL (ref 8.9–10.3)
Chloride: 102 mmol/L (ref 98–111)
Creatinine: 0.86 mg/dL (ref 0.44–1.00)
GFR, Estimated: >60 mL/min (ref >60)
Glucose, Bld: 116 mg/dL — ABNORMAL HIGH (ref 70–99)
Potassium: 3.9 mmol/L (ref 3.5–5.1)
Sodium: 137 mmol/L (ref 135–145)
Total Bilirubin: 0.4 mg/dL (ref 0.3–1.2)
Total Protein: 7.1 g/dL (ref 6.5–8.1)

## 2021-05-17 MED ORDER — HEPARIN SOD (PORK) LOCK FLUSH 100 UNIT/ML IV SOLN
500.0000 [IU] | Freq: Once | INTRAVENOUS | Status: AC
Start: 2021-05-17 — End: 2021-05-17
  Administered 2021-05-17: 500 [IU]
  Filled 2021-05-17: qty 5

## 2021-05-17 MED ORDER — SODIUM CHLORIDE 0.9% FLUSH
10.0000 mL | Freq: Once | INTRAVENOUS | Status: AC
  Administered 2021-05-17: 10 mL
  Filled 2021-05-17: qty 10

## 2021-05-18 ENCOUNTER — Inpatient Hospital Stay: Payer: Medicare PPO | Attending: Internal Medicine

## 2021-05-18 VITALS — BP 113/56 | HR 103 | Temp 99.0°F | Resp 18

## 2021-05-18 DIAGNOSIS — C3411 Malignant neoplasm of upper lobe, right bronchus or lung: Secondary | ICD-10-CM | POA: Insufficient documentation

## 2021-05-18 DIAGNOSIS — Z5111 Encounter for antineoplastic chemotherapy: Secondary | ICD-10-CM | POA: Diagnosis present

## 2021-05-18 DIAGNOSIS — C50912 Malignant neoplasm of unspecified site of left female breast: Secondary | ICD-10-CM | POA: Diagnosis not present

## 2021-05-18 DIAGNOSIS — Z5112 Encounter for antineoplastic immunotherapy: Secondary | ICD-10-CM | POA: Insufficient documentation

## 2021-05-18 DIAGNOSIS — C3410 Malignant neoplasm of upper lobe, unspecified bronchus or lung: Secondary | ICD-10-CM

## 2021-05-18 DIAGNOSIS — Z170 Estrogen receptor positive status [ER+]: Secondary | ICD-10-CM | POA: Insufficient documentation

## 2021-05-18 DIAGNOSIS — C7971 Secondary malignant neoplasm of right adrenal gland: Secondary | ICD-10-CM | POA: Insufficient documentation

## 2021-05-18 DIAGNOSIS — C7972 Secondary malignant neoplasm of left adrenal gland: Secondary | ICD-10-CM | POA: Insufficient documentation

## 2021-05-18 DIAGNOSIS — Z79899 Other long term (current) drug therapy: Secondary | ICD-10-CM | POA: Diagnosis not present

## 2021-05-18 DIAGNOSIS — Z87891 Personal history of nicotine dependence: Secondary | ICD-10-CM | POA: Insufficient documentation

## 2021-05-18 MED ORDER — PALONOSETRON HCL INJECTION 0.25 MG/5ML
INTRAVENOUS | Status: AC
  Filled 2021-05-18: qty 5

## 2021-05-18 MED ORDER — SODIUM CHLORIDE 0.9% FLUSH
10.0000 mL | INTRAVENOUS | Status: DC | PRN
  Administered 2021-05-18: 10 mL
  Filled 2021-05-18: qty 10

## 2021-05-18 MED ORDER — SODIUM CHLORIDE 0.9 % IV SOLN
150.0000 mg | Freq: Once | INTRAVENOUS | Status: AC
  Administered 2021-05-18: 150 mg via INTRAVENOUS
  Filled 2021-05-18: qty 5

## 2021-05-18 MED ORDER — SODIUM CHLORIDE 0.9 % IV SOLN
400.0000 mg/m2 | Freq: Once | INTRAVENOUS | Status: AC
  Administered 2021-05-18: 700 mg via INTRAVENOUS
  Filled 2021-05-18: qty 20

## 2021-05-18 MED ORDER — HEPARIN SOD (PORK) LOCK FLUSH 100 UNIT/ML IV SOLN
500.0000 [IU] | Freq: Once | INTRAVENOUS | Status: AC | PRN
  Administered 2021-05-18: 500 [IU]
  Filled 2021-05-18: qty 5

## 2021-05-18 MED ORDER — SODIUM CHLORIDE 0.9 % IV SOLN
Freq: Once | INTRAVENOUS | Status: AC
  Filled 2021-05-18: qty 250

## 2021-05-18 MED ORDER — SODIUM CHLORIDE 0.9 % IV SOLN
200.0000 mg | Freq: Once | INTRAVENOUS | Status: AC
  Administered 2021-05-18: 200 mg via INTRAVENOUS
  Filled 2021-05-18: qty 8

## 2021-05-18 MED ORDER — SODIUM CHLORIDE 0.9 % IV SOLN
270.0000 mg | Freq: Once | INTRAVENOUS | Status: AC
  Administered 2021-05-18: 270 mg via INTRAVENOUS
  Filled 2021-05-18: qty 27

## 2021-05-18 MED ORDER — PALONOSETRON HCL INJECTION 0.25 MG/5ML
0.2500 mg | Freq: Once | INTRAVENOUS | Status: AC
  Administered 2021-05-18: 0.25 mg via INTRAVENOUS

## 2021-05-18 MED ORDER — SODIUM CHLORIDE 0.9 % IV SOLN
10.0000 mg | Freq: Once | INTRAVENOUS | Status: AC
  Administered 2021-05-18: 10 mg via INTRAVENOUS
  Filled 2021-05-18: qty 10

## 2021-05-18 NOTE — Patient Instructions (Signed)
[facility]  Discharge Instructions: Thank you for choosing [facility] to provide your oncology and hematology care.   If you have a lab appointment with the [facility], please go directly to the [facility] and check in at the registration area.   Wear comfortable clothing and clothing appropriate for easy access to any Portacath or PICC line.   We strive to give you quality time with your provider. You may need to reschedule your appointment if you arrive late (15 or more minutes).  Arriving late affects you and other patients whose appointments are after yours.  Also, if you miss three or more appointments without notifying the office, you may be dismissed from the clinic at the provider's discretion.      For prescription refill requests, have your pharmacy contact our office and allow 72 hours for refills to be completed.    Today you received the following chemotherapy and/or immunotherapy agents: pembrolizumab, pemetrexed, and carboplatin.      To help prevent nausea and vomiting after your treatment, we encourage you to take your nausea medication as directed.  BELOW ARE SYMPTOMS THAT SHOULD BE REPORTED IMMEDIATELY: *FEVER GREATER THAN 100.4 F (38 C) OR HIGHER *CHILLS OR SWEATING *NAUSEA AND VOMITING THAT IS NOT CONTROLLED WITH YOUR NAUSEA MEDICATION *UNUSUAL SHORTNESS OF BREATH *UNUSUAL BRUISING OR BLEEDING *URINARY PROBLEMS (pain or burning when urinating, or frequent urination) *BOWEL PROBLEMS (unusual diarrhea, constipation, pain near the anus) TENDERNESS IN MOUTH AND THROAT WITH OR WITHOUT PRESENCE OF ULCERS (sore throat, sores in mouth, or a toothache) UNUSUAL RASH, SWELLING OR PAIN  UNUSUAL VAGINAL DISCHARGE OR ITCHING   Items with * indicate a potential emergency and should be followed up as soon as possible or go to the Emergency Department if any problems should occur.  Please show the CHEMOTHERAPY ALERT CARD or  IMMUNOTHERAPY ALERT CARD at check-in to the Emergency Department and triage nurse.  Should you have questions after your visit or need to cancel or reschedule your appointment, please contact [facility]  Dept: [phone]  and follow the prompts.  Office hours are 8:00 a.m. to 4:30 p.m. Monday - Friday. Please note that voicemails left after 4:00 p.m. may not be returned until the following business day.  We are closed weekends and major holidays. You have access to a nurse at all times for urgent questions. Please call the main number to the clinic Dept: [phone] and follow the prompts.   For any non-urgent questions, you may also contact your provider using MyChart. We now offer e-Visits for anyone 72 and older to request care online for non-urgent symptoms. For details visit [email or web address].   Also download the MyChart app! Go to the app store, search "MyChart", open the app, select [facility], and log in with your MyChart username and password.  Due to Covid, a mask is required upon entering the hospital/clinic. If you do not have a mask, one will be given to you upon arrival. For doctor visits, patients may have 1 support person aged 75 or older with them. For treatment visits, patients cannot have anyone with them due to current Covid guidelines and our immunocompromised population.   Pembrolizumab injection What is this medication? PEMBROLIZUMAB (pem broe liz ue mab) is a monoclonal antibody. It is used totreat certain types of cancer. This medicine may be used for other purposes; ask your health care provider orpharmacist if you have questions. COMMON BRAND NAME(S): [employer]  What should I tell my care team before I take this medication? They need to know if you have any of these conditions: autoimmune diseases like Crohn's disease, ulcerative colitis, or lupus have had or planning to have an allogeneic stem cell transplant (uses someone else's  stem cells) history of organ transplant history of chest radiation nervous system problems like myasthenia gravis or Guillain-Barre syndrome an unusual or allergic reaction to pembrolizumab, other medicines, foods, dyes, or preservatives pregnant or trying to get pregnant breast-feeding How should I use this medication? This medicine is for infusion into a vein. It is given by a health careprofessional in a hospital or clinic setting. A special MedGuide will be given to you before each treatment. Be sure to readthis information carefully each time. Talk to your pediatrician regarding the use of this medicine in children. While this drug may be prescribed for children as young as 6 months for selectedconditions, precautions do apply. Overdosage: If you think you have taken too much of this medicine contact apoison control center or emergency room at once. NOTE: This medicine is only for you. Do not share this medicine with others. What if I miss a dose? It is important not to miss your dose. Call your doctor or health careprofessional if you are unable to keep an appointment. What may interact with this medication? Interactions have not been studied. This list may not describe all possible interactions. Give your health care provider a list of all the medicines, herbs, non-prescription drugs, or dietary supplements you use. Also tell them if you smoke, drink alcohol, or use illegaldrugs. Some items may interact with your medicine. What should I watch for while using this medication? Your condition will be monitored carefully while you are receiving thismedicine. You may need blood work done while you are taking this medicine. Do not become pregnant while taking this medicine or for 4 months after stopping it. Women should inform their doctor if they wish to become pregnant or think they might be pregnant. There is a potential for serious side effects to an unborn child. Talk to your health care  professional or pharmacist for more information. Do not breast-feed an infant while taking this medicine orfor 4 months after the last dose. What side effects may I notice from receiving this medication? Side effects that you should report to your doctor or health care professionalas soon as possible: allergic reactions like skin rash, itching or hives, swelling of the face, lips, or tongue bloody or black, tarry breathing problems changes in vision chest pain chills confusion constipation cough diarrhea dizziness or feeling faint or lightheaded fast or irregular heartbeat fever flushing joint pain low blood counts - this medicine may decrease the number of white blood cells, red blood cells and platelets. You may be at increased risk for infections and bleeding. muscle pain muscle weakness pain, tingling, numbness in the hands or feet persistent headache redness, blistering, peeling or loosening of the skin, including inside the mouth signs and symptoms of high blood sugar such as dizziness; dry mouth; dry skin; fruity breath; nausea; stomach pain; increased hunger or thirst; increased urination signs and symptoms of kidney injury like trouble passing urine or change in the amount of urine signs and symptoms of liver injury like dark urine, light-colored stools, loss of appetite, nausea, right upper belly pain, yellowing of the eyes or skin sweating swollen lymph nodes weight loss Side effects that usually do not require medical attention (report to yourdoctor or health care professional  if they continue or are bothersome): decreased appetite hair loss tiredness This list may not describe all possible side effects. Call your doctor for medical advice about side effects. You may report side effects to FDA at1-800-FDA-1088. Where should I keep my medication? This drug is given in a hospital or clinic and will not be stored at home. NOTE: This sheet is a summary. It may not cover  all possible information. If you have questions about this medicine, talk to your doctor, pharmacist, orhealth care provider.  2022 Elsevier/Gold Standard (2019-10-06 21:44:53)  Pemetrexed injection What is this medication? PEMETREXED (PEM e TREX ed) is a chemotherapy drug used to treat lung cancers like non-small cell lung cancer and mesothelioma. It may also be used to treatother cancers. This medicine may be used for other purposes; ask your health care provider orpharmacist if you have questions. COMMON BRAND NAME(S): Alimta What should I tell my care team before I take this medication? They need to know if you have any of these conditions: infection (especially a virus infection such as chickenpox, cold sores, or herpes) kidney disease low blood counts, like low white cell, platelet, or red cell counts lung or breathing disease, like asthma radiation therapy an unusual or allergic reaction to pemetrexed, other medicines, foods, dyes, or preservative pregnant or trying to get pregnant breast-feeding How should I use this medication? This drug is given as an infusion into a vein. It is administered in a hospitalor clinic by a specially trained health care professional. Talk to your pediatrician regarding the use of this medicine in children.Special care may be needed. Overdosage: If you think you have taken too much of this medicine contact apoison control center or emergency room at once. NOTE: This medicine is only for you. Do not share this medicine with others. What if I miss a dose? It is important not to miss your dose. Call your doctor or health careprofessional if you are unable to keep an appointment. What may interact with this medication? This medicine may interact with the following medications: Ibuprofen This list may not describe all possible interactions. Give your health care provider a list of all the medicines, herbs, non-prescription drugs, or dietary supplements  you use. Also tell them if you smoke, drink alcohol, or use illegaldrugs. Some items may interact with your medicine. What should I watch for while using this medication? Visit your doctor for checks on your progress. This drug may make you feel generally unwell. This is not uncommon, as chemotherapy can affect healthy cells as well as cancer cells. Report any side effects. Continue your course oftreatment even though you feel ill unless your doctor tells you to stop. In some cases, you may be given additional medicines to help with side effects.Follow all directions for their use. Call your doctor or health care professional for advice if you get a fever, chills or sore throat, or other symptoms of a cold or flu. Do not treat yourself. This drug decreases your body's ability to fight infections. Try toavoid being around people who are sick. This medicine may increase your risk to bruise or bleed. Call your doctor orhealth care professional if you notice any unusual bleeding. Be careful brushing and flossing your teeth or using a toothpick because you may get an infection or bleed more easily. If you have any dental work done,tell your dentist you are receiving this medicine. Avoid taking products that contain aspirin, acetaminophen, ibuprofen, naproxen, or ketoprofen unless instructed by your doctor. These medicines  may hide afever. Call your doctor or health care professional if you get diarrhea or mouthsores. Do not treat yourself. To protect your kidneys, drink water or other fluids as directed while you aretaking this medicine. Do not become pregnant while taking this medicine or for 6 months after stopping it. Women should inform their doctor if they wish to become pregnant or think they might be pregnant. Men should not father a child while taking this medicine and for 3 months after stopping it. This may interfere with the ability to father a child. You should talk to your doctor or health care  professional if you are concerned about your fertility. There is a potential for serious side effects to an unborn child. Talk to your health care professional or pharmacist for more information. Do not breast-feed an infantwhile taking this medicine or for 1 week after stopping it. What side effects may I notice from receiving this medication? Side effects that you should report to your doctor or health care professionalas soon as possible: allergic reactions like skin rash, itching or hives, swelling of the face, lips, or tongue breathing problems redness, blistering, peeling or loosening of the skin, including inside the mouth signs and symptoms of bleeding such as bloody or black, tarry stools; red or dark-brown urine; spitting up blood or brown material that looks like coffee grounds; red spots on the skin; unusual bruising or bleeding from the eye, gums, or nose signs and symptoms of infection like fever or chills; cough; sore throat; pain or trouble passing urine signs and symptoms of kidney injury like trouble passing urine or change in the amount of urine signs and symptoms of liver injury like dark yellow or brown urine; general ill feeling or flu-like symptoms; light-colored stools; loss of appetite; nausea; right upper belly pain; unusually weak or tired; yellowing of the eyes or skin Side effects that usually do not require medical attention (report to yourdoctor or health care professional if they continue or are bothersome): constipation mouth sores nausea, vomiting unusually weak or tired This list may not describe all possible side effects. Call your doctor for medical advice about side effects. You may report side effects to FDA at1-800-FDA-1088. Where should I keep my medication? This drug is given in a hospital or clinic and will not be stored at home. NOTE: This sheet is a summary. It may not cover all possible information. If you have questions about this medicine, talk to  your doctor, pharmacist, orhealth care provider.  2022 Elsevier/Gold Standard (2017-12-24 16:11:33)  Carboplatin injection What is this medication? CARBOPLATIN (KAR boe pla tin) is a chemotherapy drug. It targets fast dividing cells, like cancer cells, and causes these cells to die. This medicine is usedto treat ovarian cancer and many other cancers. This medicine may be used for other purposes; ask your health care provider orpharmacist if you have questions. COMMON BRAND NAME(S): Paraplatin What should I tell my care team before I take this medication? They need to know if you have any of these conditions: blood disorders hearing problems kidney disease recent or ongoing radiation therapy an unusual or allergic reaction to carboplatin, cisplatin, other chemotherapy, other medicines, foods, dyes, or preservatives pregnant or trying to get pregnant breast-feeding How should I use this medication? This drug is usually given as an infusion into a vein. It is administered in [facility] or clinic by a specially trained health care professional. Talk to your pediatrician regarding the use of this medicine in children.Special care may be  needed. Overdosage: If you think you have taken too much of this medicine contact apoison control center or emergency room at once. NOTE: This medicine is only for you. Do not share this medicine with others. What if I miss a dose? It is important not to miss a dose. Call your doctor or health careprofessional if you are unable to keep an appointment. What may interact with this medication? medicines for seizures medicines to increase blood counts like filgrastim, pegfilgrastim, sargramostim some antibiotics like amikacin, gentamicin, neomycin, streptomycin, tobramycin vaccines Talk to your doctor or health care professional before taking any of thesemedicines: acetaminophen aspirin ibuprofen ketoprofen naproxen This list may not describe all possible  interactions. Give your health care provider a list of all the medicines, herbs, non-prescription drugs, or dietary supplements you use. Also tell them if you smoke, drink alcohol, or use illegaldrugs. Some items may interact with your medicine. What should I watch for while using this medication? Your condition will be monitored carefully while you are receiving this medicine. You will need important blood work done while you are taking thismedicine. This drug may make you feel generally unwell. This is not uncommon, as chemotherapy can affect healthy cells as well as cancer cells. Report any side effects. Continue your course of treatment even though you feel ill unless yourdoctor tells you to stop. In some cases, you may be given additional medicines to help with side effects.Follow all directions for their use. Call your doctor or health care professional for advice if you get a fever, chills or sore throat, or other symptoms of a cold or flu. Do not treat yourself. This drug decreases your body's ability to fight infections. Try toavoid being around people who are sick. This medicine may increase your risk to bruise or bleed. Call your doctor orhealth care professional if you notice any unusual bleeding. Be careful brushing and flossing your teeth or using a toothpick because you may get an infection or bleed more easily. If you have any dental work done,tell your dentist you are receiving this medicine. Avoid taking products that contain aspirin, acetaminophen, ibuprofen, naproxen, or ketoprofen unless instructed by your doctor. These medicines may hide afever. Do not become pregnant while taking this medicine. Women should inform their doctor if they wish to become pregnant or think they might be pregnant. There is a potential for serious side effects to an unborn child. Talk to your health care professional or pharmacist for more information. Do not breast-feed aninfant while taking this  medicine. What side effects may I notice from receiving this medication? Side effects that you should report to your doctor or health care professionalas soon as possible: allergic reactions like skin rash, itching or hives, swelling of the face, lips, or tongue signs of infection - fever or chills, cough, sore throat, pain or difficulty passing urine signs of decreased platelets or bleeding - bruising, pinpoint red spots on the skin, black, tarry stools, nosebleeds signs of decreased red blood cells - unusually weak or tired, fainting spells, lightheadedness breathing problems changes in hearing changes in vision chest pain high blood pressure low blood counts - This drug may decrease the number of white blood cells, red blood cells and platelets. You may be at increased risk for infections and bleeding. nausea and vomiting pain, swelling, redness or irritation at the injection site pain, tingling, numbness in the hands or feet problems with balance, talking, walking trouble passing urine or change in the amount of urine Side effects that  usually do not require medical attention (report to yourdoctor or health care professional if they continue or are bothersome): hair loss loss of appetite metallic taste in the mouth or changes in taste This list may not describe all possible side effects. Call your doctor for medical advice about side effects. You may report side effects to FDA at1-800-FDA-1088. Where should I keep my medication? This drug is given in a hospital or clinic and will not be stored at home. NOTE: This sheet is a summary. It may not cover all possible information. If you have questions about this medicine, talk to your doctor, pharmacist, orhealth care provider.  2022 Elsevier/Gold Standard (2008-02-09 14:38:05)

## 2021-05-18 NOTE — Progress Notes (Signed)
Ok to treat with HR per Cassie, PA.

## 2021-05-22 ENCOUNTER — Telehealth: Payer: Self-pay | Admitting: Internal Medicine

## 2021-05-22 NOTE — Telephone Encounter (Signed)
Scheduled appt per 6/30 sch msg. Pt aware.

## 2021-05-24 ENCOUNTER — Other Ambulatory Visit: Payer: Medicare PPO

## 2021-05-24 NOTE — Progress Notes (Deleted)
HPI: FU paroxysmal supraventricular tachycardia, AS, hypercholesterolemia, and benign hypertensive heart disease. Echocardiogram October 2021 showed normal LV function, mild left ventricular hypertrophy, grade 2 diastolic dysfunction, mild mitral regurgitation, moderate to severe aortic stenosis with mean gradient 31 mmHg and mild aortic insufficiency.  Had recurrent SVT.  Patient underwent cardiac catheterization February 2022 in anticipation of TAVR. Coronaries were normal. Carotid Dopplers March 2022 showed 1 to 39% bilateral stenosis In anticipation of TAVR patient had CTA March 2022.  There was note of a large right upper lobe mass with potential for metastatic disease noted; small to moderate right pleural effusion; multiple renal lesions and follow-up MRI recommended.  PET scan showed hypermetabolic right upper lobe mass and adrenals concerning for metastatic disease. There was also note of left breast uptake and follow-up mammogram recommended.  Renal lesions were felt to be cysts.  Since last seen,   Current Outpatient Medications  Medication Sig Dispense Refill   amiodarone (PACERONE) 200 MG tablet TAKE ONE (1) TABLET BY MOUTH EVERY DAY (Patient taking differently: Take 200 mg by mouth daily.) 90 tablet 3   amLODipine (NORVASC) 2.5 MG tablet Take 1 tablet (2.5 mg total) by mouth daily. (Patient taking differently: Take 2.5 mg by mouth at bedtime.) 30 tablet 11   amoxicillin (AMOXIL) 500 MG capsule Take 500 mg by mouth as directed. 4 capsules before dental procedures (Patient not taking: Reported on 04/19/2021)     anastrozole (ARIMIDEX) 1 MG tablet Take 1 tablet (1 mg total) by mouth daily. 30 tablet 2   BIOTIN PO Take 1 tablet by mouth daily.      calcium-vitamin D (OSCAL WITH D) 500-200 MG-UNIT tablet Take 1 tablet by mouth daily with breakfast.     folic acid (FOLVITE) 1 MG tablet Take 1 tablet (1 mg total) by mouth daily. 30 tablet 2   ibuprofen (ADVIL) 200 MG tablet Take 400 mg by  mouth every 6 (six) hours as needed for mild pain or headache. For pain     lidocaine-prilocaine (EMLA) cream Apply 1 application topically as needed. 30 g 2   pindolol (VISKEN) 10 MG tablet Take 1 tablet (10 mg total) by mouth 2 (two) times daily. 60 tablet 6   prochlorperazine (COMPAZINE) 10 MG tablet Take 1 tablet (10 mg total) by mouth every 6 (six) hours as needed. 30 tablet 2   rosuvastatin (CRESTOR) 5 MG tablet Take 5 mg by mouth every Monday, Wednesday, and Friday.     Current Facility-Administered Medications  Medication Dose Route Frequency Provider Last Rate Last Admin   sodium chloride flush (NS) 0.9 % injection 3 mL  3 mL Intravenous Q12H Lelon Perla, MD         Past Medical History:  Diagnosis Date   Adenomatous colon polyp    Depression    Tx'd 19 yrs ago when husband passed away   Diverticulosis    Hyperlipidemia    Hypertension    Hypothyroidism    Dr. Dwyane Dee follows pt.--had small nodules but had decreased in size and stopped Synthroid 06/2013   Osteoporosis    Pure hypercholesterolemia    Severe aortic stenosis    SVT (supraventricular tachycardia) [facility])     Past Surgical History:  Procedure Laterality Date   BRONCHIAL BIOPSY  03/01/2021   Procedure: BRONCHIAL BIOPSIES;  Surgeon: Garner Nash, DO;  Location: [place] [facility];  Service: Pulmonary;;   BRONCHIAL BRUSHINGS  03/01/2021   Procedure: BRONCHIAL BRUSHINGS;  Surgeon: Garner Nash,  DO;  Location: [place] [facility];  Service: Pulmonary;;   BRONCHIAL NEEDLE ASPIRATION BIOPSY  03/01/2021   Procedure: BRONCHIAL NEEDLE ASPIRATION BIOPSIES;  Surgeon: Garner Nash, DO;  Location: [place] [facility];  Service: Pulmonary;;   BRONCHIAL WASHINGS  03/01/2021   Procedure: BRONCHIAL WASHINGS;  Surgeon: Garner Nash, DO;  Location: [place] [facility];  Service: Pulmonary;;   CERVICAL BIOPSY  W/ LOOP ELECTRODE EXCISION  1996   CIN I   COLONOSCOPY  03/2001, 01/2007   2008 - no polyps   DILATION AND CURETTAGE OF UTERUS      ESOPHAGOGASTRODUODENOSCOPY (EGD) WITH PROPOFOL N/A 03/02/2021   Procedure: ESOPHAGOGASTRODUODENOSCOPY (EGD) WITH PROPOFOL;  Surgeon: Jerene Bears, MD;  Location: [place];  Service: Gastroenterology;  Laterality: N/A;   FOREIGN BODY REMOVAL  03/02/2021   Procedure: FOREIGN BODY REMOVAL;  Surgeon: Jerene Bears, MD;  Location: [facility];  Service: Gastroenterology;;   IR IMAGING GUIDED PORT INSERTION  05/01/2021   LEFT HEART CATH AND CORONARY ANGIOGRAPHY N/A 01/12/2021   Procedure: LEFT HEART CATH AND CORONARY ANGIOGRAPHY;  Surgeon: Sherren Mocha, MD;  Location: [place] [facility];  Service: Cardiovascular;  Laterality: N/A;   TONSILLECTOMY     VIDEO BRONCHOSCOPY WITH ENDOBRONCHIAL ULTRASOUND N/A 03/01/2021   Procedure: VIDEO BRONCHOSCOPY WITH ENDOBRONCHIAL ULTRASOUND;  Surgeon: Garner Nash, DO;  Location: [place];  Service: Pulmonary;  Laterality: N/A;    Social History   Socioeconomic History   Marital status: Widowed    Spouse name: Not on file   Number of children: 2   Years of education: Not on file   Highest education level: Not on file  Occupational History   Occupation: Retired  Tobacco Use   Smoking status: Former    Pack years: 0.00    Types: Cigarettes    Quit date: 06/17/1972    Years since quitting: 48.9   Smokeless tobacco: Never   Tobacco comments:    quit June 17, 1972  Vaping Use   Vaping Use: Never used  Substance and Sexual Activity   Alcohol use: Yes    Alcohol/week: 0.0 standard drinks    Comment: 2 drinks per month   Drug use: No   Sexual activity: Never    Partners: Male    Birth control/protection: Post-menopausal  Other Topics Concern   Not on file  Social History Narrative   Not on file   Social Determinants of Health   Financial Resource Strain: Not on file  Food Insecurity: Not on file  Transportation Needs: Not on file  Physical Activity: Not on file  Stress: Not on file  Social Connections: Not on file  Intimate  Partner Violence: Not on file    Family History  Problem Relation Age of Onset   Stroke Sister    Breast cancer Sister    Breast cancer Daughter 64       metastatic breast ca    ROS: no fevers or chills, productive cough, hemoptysis, dysphasia, odynophagia, melena, hematochezia, dysuria, hematuria, rash, seizure activity, orthopnea, PND, pedal edema, claudication. Remaining systems are negative.  Physical Exam: Well-developed well-nourished in no acute distress.  Skin is warm and dry.  HEENT is normal.  Neck is supple.  Chest is clear to auscultation with normal expansion.  Cardiovascular exam is regular rate and rhythm.  Abdominal exam nontender or distended. No masses palpated. Extremities show no edema. neuro grossly intact  ECG- personally reviewed  A/P  1 aortic stenosis-patient had previously been scheduled for TAVR but this  has been placed on hold as she has been diagnosed with non-small cell lung cancer stage IV.  We can consider TAVR in the future pending the prognosis of her cancer.  2 SVT-no recurrences.  Continue amiodarone and pindolol.  3 hypertension-blood pressure controlled.  Continue present medical regimen.  4 hyperlipidemia-continue statin.  5 stage IV non-small cell lung cancer/breast cancer-Per oncology.  Kirk Ruths, MD

## 2021-05-25 ENCOUNTER — Telehealth: Payer: Self-pay | Admitting: Medical Oncology

## 2021-05-25 NOTE — Telephone Encounter (Signed)
Skilled Nursing update-Labs and port care.Kim Ward has been admitted to [facility] skilled facility .   I LVM for Lattie Haw  and left instructions for weekly CBC/diff and CMP and TSH every 3 weeks.  ( Due yesterday ).   Lattie Haw stated they can draw her labs via port , flush it and provide maintenance care.  Next f/u 06/06/21.

## 2021-05-28 ENCOUNTER — Encounter: Payer: Medicare PPO | Admitting: Nutrition

## 2021-05-30 ENCOUNTER — Ambulatory Visit: Payer: Medicare PPO | Admitting: Cardiology

## 2021-05-31 ENCOUNTER — Encounter: Payer: Self-pay | Admitting: Internal Medicine

## 2021-05-31 ENCOUNTER — Other Ambulatory Visit: Payer: Medicare PPO

## 2021-06-01 ENCOUNTER — Telehealth: Payer: Self-pay | Admitting: Medical Oncology

## 2021-06-01 NOTE — Telephone Encounter (Signed)
Appts for labs, CT and f/u & infusion next week were confirmed with Kathrene Bongo at [facility].

## 2021-06-04 IMAGING — DX DG CHEST 1V
1 series · 1 of 1 positions shown · non-contrast
Comparison: Chest x-ray 03/01/2021, CT chest 01/22/2021

CLINICAL DATA: Status post thoracentesis.

EXAM:
CHEST  1 VIEW

[chest ap]
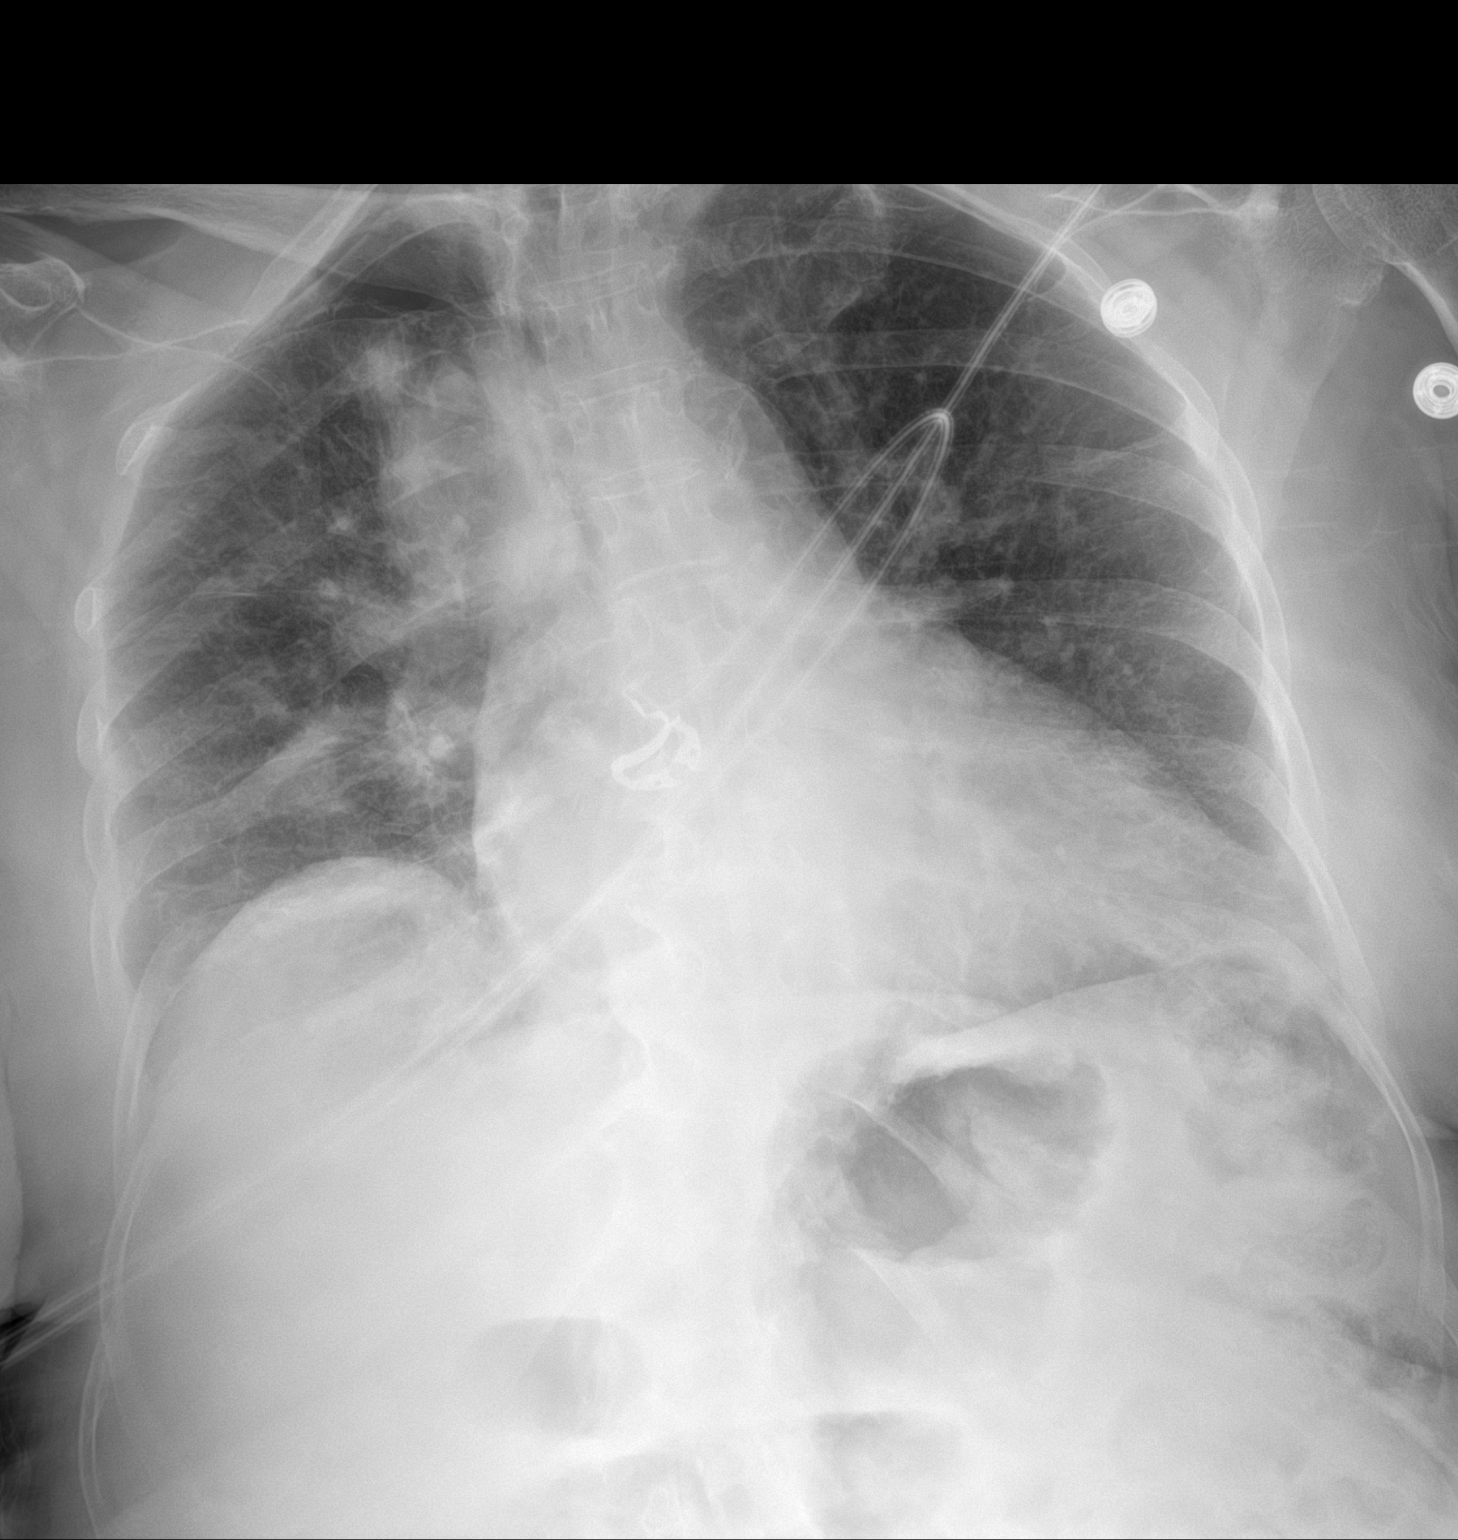

[1 of 1 positions shown; findings below may reference images not displayed]

FINDINGS: The heart size and mediastinal contours are unchanged. Aortic arch
calcifications.

Thickened right paratracheal stripe with Redemonstration of a 4.9 cm
paramediastinal right lung density. Nodular-like patchy airspace
opacities overlying the right lung. Patchy airspace opacities of the
right lung. No pulmonary edema. Possible trace bilateral pleural
effusions. Interval decrease in size of a small right apical
pneumothorax. No left pneumothorax.

No acute osseous abnormality. Emphysema of the right neck soft
tissues.
IMPRESSION: 1. Interval decrease in size of a small right apical pneumothorax.
2. Possible trace bilateral pleural effusions.
3. Persistent 4.9 cm paramediastinal right lung mass. Nodular-like
patchy airspace opacities overlying the right lung - underlying
pulmonary nodules not excluded (findings could also represent vessel
en face).
4. Patchy airspace opacities of the right lung that could represent
a combination atelectasis and or infection/inflammation.

## 2021-06-05 ENCOUNTER — Ambulatory Visit (HOSPITAL_COMMUNITY)
Admission: RE | Admit: 2021-06-05 | Discharge: 2021-06-05 | Disposition: A | Discharge status: Home / Self Care | Payer: Medicare PPO | Source: Ambulatory Visit | Attending: Physician Assistant | Admitting: Physician Assistant

## 2021-06-05 ENCOUNTER — Inpatient Hospital Stay: Payer: Medicare PPO

## 2021-06-05 ENCOUNTER — Other Ambulatory Visit: Payer: Self-pay

## 2021-06-05 DIAGNOSIS — N281 Cyst of kidney, acquired: Secondary | ICD-10-CM | POA: Insufficient documentation

## 2021-06-05 DIAGNOSIS — C3411 Malignant neoplasm of upper lobe, right bronchus or lung: Secondary | ICD-10-CM

## 2021-06-05 DIAGNOSIS — I700 Atherosclerosis of aorta: Secondary | ICD-10-CM | POA: Insufficient documentation

## 2021-06-05 DIAGNOSIS — Z95828 Presence of other vascular implants and grafts: Secondary | ICD-10-CM

## 2021-06-05 DIAGNOSIS — Z5112 Encounter for antineoplastic immunotherapy: Secondary | ICD-10-CM | POA: Diagnosis not present

## 2021-06-05 DIAGNOSIS — C3410 Malignant neoplasm of upper lobe, unspecified bronchus or lung: Secondary | ICD-10-CM

## 2021-06-05 LAB — CBC WITH DIFFERENTIAL (CANCER CENTER ONLY)
Abs Immature Granulocytes: 0.19 10*3/uL — ABNORMAL HIGH (ref 0.00–0.07)
Basophils Absolute: 0 10*3/uL (ref 0.0–0.1)
Basophils Relative: 1 %
Eosinophils Absolute: 0.2 10*3/uL (ref 0.0–0.5)
Eosinophils Relative: 3 %
HCT: 30.5 % — ABNORMAL LOW (ref 36.0–46.0)
Hemoglobin: 10 g/dL — ABNORMAL LOW (ref 12.0–15.0)
Immature Granulocytes: 3 %
Lymphocytes Relative: 19 %
Lymphs Abs: 1.3 10*3/uL (ref 0.7–4.0)
MCH: 27.9 pg (ref 26.0–34.0)
MCHC: 32.8 g/dL (ref 30.0–36.0)
MCV: 85 fL (ref 80.0–100.0)
Monocytes Absolute: 1.4 10*3/uL — ABNORMAL HIGH (ref 0.1–1.0)
Monocytes Relative: 22 %
Neutro Abs: 3.5 10*3/uL (ref 1.7–7.7)
Neutrophils Relative %: 52 %
Platelet Count: 516 10*3/uL — ABNORMAL HIGH (ref 150–400)
RBC: 3.59 MIL/uL — ABNORMAL LOW (ref 3.87–5.11)
RDW: 14.9 % (ref 11.5–15.5)
WBC Count: 6.7 10*3/uL (ref 4.0–10.5)
nRBC: 0 % (ref 0.0–0.2)

## 2021-06-05 LAB — CMP (CANCER CENTER ONLY)
ALT: 44 U/L (ref 0–44)
AST: 32 U/L (ref 15–41)
Albumin: 2.2 g/dL — ABNORMAL LOW (ref 3.5–5.0)
Alkaline Phosphatase: 140 U/L — ABNORMAL HIGH (ref 38–126)
Anion gap: 9 (ref 5–15)
BUN: 11 mg/dL (ref 8–23)
CO2: 29 mmol/L (ref 22–32)
Calcium: 9.4 mg/dL (ref 8.9–10.3)
Chloride: 98 mmol/L (ref 98–111)
Creatinine: 0.67 mg/dL (ref 0.44–1.00)
GFR, Estimated: >60 mL/min (ref >60)
Glucose, Bld: 86 mg/dL (ref 70–99)
Potassium: 3.6 mmol/L (ref 3.5–5.1)
Sodium: 136 mmol/L (ref 135–145)
Total Bilirubin: 0.3 mg/dL (ref 0.3–1.2)
Total Protein: 6.6 g/dL (ref 6.5–8.1)

## 2021-06-05 LAB — TSH: TSH: 0.997 u[IU]/mL (ref 0.308–3.960)

## 2021-06-05 MED ORDER — HEPARIN SOD (PORK) LOCK FLUSH 100 UNIT/ML IV SOLN: 500.0000 [IU] | Freq: Once | INTRAVENOUS | Status: AC

## 2021-06-05 MED ORDER — HEPARIN SOD (PORK) LOCK FLUSH 100 UNIT/ML IV SOLN
INTRAVENOUS | Status: AC
  Administered 2021-06-05: 500 [IU] via INTRAVENOUS
  Filled 2021-06-05: qty 5

## 2021-06-05 MED ORDER — SODIUM CHLORIDE 0.9% FLUSH
10.0000 mL | Freq: Once | INTRAVENOUS | Status: AC
  Administered 2021-06-05: 10 mL
  Filled 2021-06-05: qty 10

## 2021-06-05 MED ORDER — IOHEXOL 350 MG/ML SOLN
100.0000 mL | Freq: Once | INTRAVENOUS | Status: AC | PRN
  Administered 2021-06-05: 80 mL via INTRAVENOUS

## 2021-06-06 ENCOUNTER — Inpatient Hospital Stay: Payer: Medicare PPO | Admitting: Dietician

## 2021-06-06 ENCOUNTER — Inpatient Hospital Stay: Payer: Medicare PPO | Admitting: Internal Medicine

## 2021-06-06 ENCOUNTER — Other Ambulatory Visit: Payer: Medicare PPO

## 2021-06-06 ENCOUNTER — Inpatient Hospital Stay: Payer: Medicare PPO

## 2021-06-06 ENCOUNTER — Telehealth: Payer: Self-pay | Admitting: *Deleted

## 2021-06-06 ENCOUNTER — Encounter: Payer: Self-pay | Admitting: Internal Medicine

## 2021-06-06 VITALS — BP 103/46 | HR 106 | Temp 98.4°F | Resp 18 | Ht 65.0 in | Wt 140.9 lb

## 2021-06-06 DIAGNOSIS — Z5112 Encounter for antineoplastic immunotherapy: Secondary | ICD-10-CM | POA: Diagnosis not present

## 2021-06-06 DIAGNOSIS — C3411 Malignant neoplasm of upper lobe, right bronchus or lung: Secondary | ICD-10-CM | POA: Diagnosis not present

## 2021-06-06 DIAGNOSIS — Z5111 Encounter for antineoplastic chemotherapy: Secondary | ICD-10-CM

## 2021-06-06 NOTE — Telephone Encounter (Signed)
Referral called to [facility]

## 2021-06-06 NOTE — Progress Notes (Signed)
[facility] [phone]   [phone]  OFFICE PROGRESS NOTE  [name], NP [address] [place] [address]   DIAGNOSIS:  1) Stage IV (T3, N2, M1 C) non-small cell lung carcinoma and probably adenocarcinoma in this patient with remote history of smoking diagnosed in April 2022.  She presented with large right lower lobe lung mass in addition to right hilar and mediastinal lymphadenopathy as well as bilateral adrenal metastasis. Diagnosed in April 2022. 2) Stage I Breast Cancer, ER/PR positive. Diagnosed in April 2022.    DETECTED ALTERATION(S) / BIOMARKER(S)     % CFDNA OR AMPLIFICATION        ASSOCIATED FDA-APPROVED THERAPIES         CLINICAL TRIAL AVAILABILITY [id number]* 0.3%   Olaparib, Rucaparib, Talazoparib Yes [id number] Site SNV 6.4%   Olaparib Yes [id number] 0.3% None     Yes   PRIOR THERAPY: None   CURRENT THERAPY: Palliative systemic chemotherapy with carboplatin for an AUC of 5, Alimta 500 mg per metered squared, Keytruda 200 mg IV every 3 weeks.  First dose expected on 04/26/2021.  Status post 2 cycles.  INTERVAL HISTORY: Kim Ward 87 y.o. female returns to the clinic today for follow-up visit accompanied by her daughter.  The patient is feeling fine today with no concerning complaints except for the fatigue.  She tolerated the second cycle of her treatment reasonably well except for few episodes of nausea and also few episodes of diarrhea.  She denied having any current chest pain, shortness of breath except with exertion with mild cough and no hemoptysis.  She lost few pounds since her last visit.  She denied having any headache or visual changes.  She had repeat CT scan of the chest, abdomen pelvis performed yesterday and she is here for evaluation and discussion of her risk her results.  MEDICAL HISTORY: Past Medical History:  Diagnosis Date   Adenomatous colon polyp    Depression    Tx'd 19 yrs ago when  husband passed away   Diverticulosis    Hyperlipidemia    Hypertension    Hypothyroidism    Dr. Dwyane Dee follows pt.--had small nodules but had decreased in size and stopped Synthroid 06/2013   Osteoporosis    Pure hypercholesterolemia    Severe aortic stenosis    SVT (supraventricular tachycardia) (HCC)     ALLERGIES:  is allergic to augmentin [amoxicillin-pot clavulanate], azithromycin, bextra [valdecoxib], cefuroxime, cefuroxime axetil, etodolac, humibid la [guaifenesin], lipitor [atorvastatin calcium], ramipril, risedronate sodium, tramadol, zocor [simvastatin], and niaspan [niacin er].  MEDICATIONS:  Current Outpatient Medications  Medication Sig Dispense Refill   amiodarone (PACERONE) 200 MG tablet TAKE ONE (1) TABLET BY MOUTH EVERY DAY (Patient taking differently: Take 200 mg by mouth daily.) 90 tablet 3   amLODipine (NORVASC) 2.5 MG tablet Take 1 tablet (2.5 mg total) by mouth daily. (Patient taking differently: Take 2.5 mg by mouth at bedtime.) 30 tablet 11   anastrozole (ARIMIDEX) 1 MG tablet Take 1 tablet (1 mg total) by mouth daily. 30 tablet 2   BIOTIN PO Take 1 tablet by mouth daily.      calcium-vitamin D (OSCAL WITH D) 500-200 MG-UNIT tablet Take 1 tablet by mouth daily with breakfast.     folic acid (FOLVITE) 1 MG tablet Take 1 tablet (1 mg total) by mouth daily. 30 tablet 2   ibuprofen (ADVIL) 200 MG tablet Take 400 mg by mouth every 6 (six) hours as  needed for mild pain or headache. For pain     lidocaine-prilocaine (EMLA) cream Apply 1 application topically as needed. 30 g 2   pindolol (VISKEN) 10 MG tablet Take 1 tablet (10 mg total) by mouth 2 (two) times daily. 60 tablet 6   prochlorperazine (COMPAZINE) 10 MG tablet Take 1 tablet (10 mg total) by mouth every 6 (six) hours as needed. 30 tablet 2   rosuvastatin (CRESTOR) 5 MG tablet Take 5 mg by mouth every Monday, Wednesday, and Friday.     amoxicillin (AMOXIL) 500 MG capsule Take 500 mg by mouth as directed. 4 capsules  before dental procedures (Patient not taking: Reported on 06/06/2021)     Current Facility-Administered Medications  Medication Dose Route Frequency Provider Last Rate Last Admin   sodium chloride flush (NS) 0.9 % injection 3 mL  3 mL Intravenous Q12H Lelon Perla, MD        SURGICAL HISTORY:  Past Surgical History:  Procedure Laterality Date   BRONCHIAL BIOPSY  03/01/2021   Procedure: BRONCHIAL BIOPSIES;  Surgeon: Garner Nash, DO;  Location: [facility];  Service: Pulmonary;;   BRONCHIAL BRUSHINGS  03/01/2021   Procedure: BRONCHIAL BRUSHINGS;  Surgeon: Garner Nash, DO;  Location: [facility];  Service: Pulmonary;;   BRONCHIAL NEEDLE ASPIRATION BIOPSY  03/01/2021   Procedure: BRONCHIAL NEEDLE ASPIRATION BIOPSIES;  Surgeon: Garner Nash, DO;  Location: [facility];  Service: Pulmonary;;   BRONCHIAL WASHINGS  03/01/2021   Procedure: BRONCHIAL WASHINGS;  Surgeon: Garner Nash, DO;  Location: [facility];  Service: Pulmonary;;   CERVICAL BIOPSY  W/ LOOP ELECTRODE EXCISION  1996   CIN I   COLONOSCOPY  03/2001, 01/2007   2008 - no polyps   DILATION AND CURETTAGE OF UTERUS     ESOPHAGOGASTRODUODENOSCOPY (EGD) WITH PROPOFOL N/A 03/02/2021   Procedure: ESOPHAGOGASTRODUODENOSCOPY (EGD) WITH PROPOFOL;  Surgeon: Jerene Bears, MD;  Location: [facility];  Service: Gastroenterology;  Laterality: N/A;   FOREIGN BODY REMOVAL  03/02/2021   Procedure: FOREIGN BODY REMOVAL;  Surgeon: Jerene Bears, MD;  Location: [facility];  Service: Gastroenterology;;   IR IMAGING GUIDED PORT INSERTION  05/01/2021   LEFT HEART CATH AND CORONARY ANGIOGRAPHY N/A 01/12/2021   Procedure: LEFT HEART CATH AND CORONARY ANGIOGRAPHY;  Surgeon: Sherren Mocha, MD;  Location: [facility];  Service: Cardiovascular;  Laterality: N/A;   TONSILLECTOMY     VIDEO BRONCHOSCOPY WITH ENDOBRONCHIAL ULTRASOUND N/A 03/01/2021   Procedure: VIDEO BRONCHOSCOPY WITH ENDOBRONCHIAL ULTRASOUND;  Surgeon: Garner Nash, DO;  Location: [facility];  Service: Pulmonary;  Laterality: N/A;    REVIEW OF SYSTEMS:  Constitutional: positive for fatigue Eyes: negative Ears, nose, mouth, throat, and face: negative Respiratory: negative Cardiovascular: negative Gastrointestinal: negative Genitourinary:negative Integument/breast: negative Hematologic/lymphatic: negative Musculoskeletal:negative Neurological: negative Behavioral/Psych: negative Endocrine: negative Allergic/Immunologic: negative   PHYSICAL EXAMINATION: General appearance: alert, cooperative, fatigued, and no distress Head: Normocephalic, without obvious abnormality, atraumatic Neck: no adenopathy, no JVD, supple, symmetrical, trachea midline, and thyroid not enlarged, symmetric, no tenderness/mass/nodules Lymph nodes: Cervical, supraclavicular, and axillary nodes normal. Resp: clear to auscultation bilaterally Back: no kyphosis present, symmetric, no curvature. ROM normal. No CVA tenderness. Cardio: regular rate and rhythm, S1, S2 normal, no murmur, click, rub or gallop GI: soft, non-tender; bowel sounds normal; no masses,  no organomegaly Extremities: extremities normal, atraumatic, no cyanosis or edema Neurologic: Alert and oriented X 3, normal strength and tone. Normal symmetric reflexes. Normal coordination and gait   ECOG PERFORMANCE STATUS:  1 - Symptomatic but completely ambulatory  Blood pressure (!) 103/46, pulse (!) 106, temperature 98.4 F (36.9 C), temperature source Tympanic, resp. rate 18, height 5\' 5"  (1.651 m), weight 140 lb 14.4 oz (63.9 kg), last menstrual period 11/19/1983, SpO2 97 %.  LABORATORY DATA: Lab Results  Component Value Date   WBC 6.7 06/05/2021   HGB 10.0 (L) 06/05/2021   HCT 30.5 (L) 06/05/2021   MCV 85.0 06/05/2021   PLT 516 (H) 06/05/2021      Chemistry      Component Value Date/Time   NA 136 06/05/2021 1116   NA 140 12/27/2020 1137   K 3.6 06/05/2021 1116   CL 98 06/05/2021 1116   CO2 29  06/05/2021 1116   BUN 11 06/05/2021 1116   BUN 19 12/27/2020 1137   CREATININE 0.67 06/05/2021 1116   CREATININE 0.95 (H) 06/24/2016 0835      Component Value Date/Time   CALCIUM 9.4 06/05/2021 1116   ALKPHOS 140 (H) 06/05/2021 1116   AST 32 06/05/2021 1116   ALT 44 06/05/2021 1116   BILITOT 0.3 06/05/2021 1116       RADIOGRAPHIC STUDIES: CT Chest W Contrast  Result Date: 06/06/2021 CLINICAL DATA:  Primary Cancer Type: Lung Imaging Indication: Assess response to therapy Interval therapy since last imaging? Yes Initial Cancer Diagnosis Date: 03/01/2021; Established by: Presumed Detailed Pathology: Stage IV non-small cell lung carcinoma, presumed adenocarcinoma. Primary Tumor location: Right upper lobe. Bilateral adrenal metastases. Surgeries: No. Chemotherapy: Yes; Ongoing? Yes; Most recent administration: 05/18/2021 Immunotherapy?  Yes; Type: Keytruda; Ongoing? Yes Radiation therapy? No Other Cancer Therapies: Arimidex for breast cancer. EXAM: CT CHEST, ABDOMEN, AND PELVIS WITH CONTRAST TECHNIQUE: Multidetector CT imaging of the chest, abdomen and pelvis was performed following the standard protocol during bolus administration of intravenous contrast. CONTRAST:  79mL OMNIPAQUE IOHEXOL 350 MG/ML SOLN COMPARISON:  Most recent CT chest 01/22/2021.  02/20/2021 PET-CT. FINDINGS: CT CHEST FINDINGS Cardiovascular: Heart size upper normal. Aortic valve calcification noted. Mild atherosclerotic calcification is noted in the wall of the thoracic aorta. Right Port-A-Cath tip is positioned in the distal SVC near the junction with the RA. Mediastinum/Nodes: Scattered small mediastinal lymph nodes again noted. 1.0 cm short axis subcarinal node has decreased from 1.4 cm previously (remeasured). The esophagus has normal imaging features. 7 mm right thyroid nodule again noted. Not clinically significant; no follow-up imaging recommended (ref: J Am Coll Radiol. 2015 Feb;12(2): 143-50). 8 mm short axis right hilar  lymph node has decreased from 11 mm short axis previously. There is no axillary lymphadenopathy. Lungs/Pleura: Interval progression of consolidative masslike opacity in the suprahilar right upper lobe measuring 9.0 x 4.0 cm today compared to 5.6 x 3.1 cm when remeasured in a similar fashion on the prior study. This is associated with progressive interlobular septal thickening in both lung apices, right greater than left with multiple tiny ill-defined nodular opacities in the posterior right upper lobe, new in the interval. Numerous tiny parenchymal nodules are seen scattered in the inferior right lower lobe new since prior, peripheral right middle lobe, new since prior and right lower lobe, also progressive since the prior exam. Index right lower lobe nodule measures 9 mm in the medial right lower lobe on 114/6. Similar but less advanced changes are noted in the left lower lobe. No substantial pleural effusion. Musculoskeletal: No worrisome lytic or sclerotic osseous abnormality. CT ABDOMEN PELVIS FINDINGS Hepatobiliary: No suspicious focal abnormality within the liver parenchyma. Small area of low attenuation in the anterior liver,  adjacent to the falciform ligament, is in a characteristic location for focal fatty deposition. Layering sludge and/or noncalcified stones noted in the gallbladder. No intrahepatic or extrahepatic biliary dilation. Pancreas: No focal mass lesion. No dilatation of the main duct. No intraparenchymal cyst. No peripancreatic edema. Spleen: No splenomegaly. No focal mass lesion. Adrenals/Urinary Tract: No adrenal nodule or mass. Bilateral renal cysts noted measuring up to 6.1 cm in the upper pole right kidney. No evidence for hydroureter. The urinary bladder appears normal for the degree of distention. Stomach/Bowel: Wall thickening in the gastric cardia may be related to underdistention. Duodenum is normally positioned as is the ligament of Treitz. Duodenal diverticulum noted. No small bowel  wall thickening. No small bowel dilatation. The terminal ileum is normal. The appendix is normal. No gross colonic mass. No colonic wall thickening. Diverticular changes are noted in the left colon without evidence of diverticulitis. Vascular/Lymphatic: There is moderate atherosclerotic calcification of the abdominal aorta without aneurysm. There is no gastrohepatic or hepatoduodenal ligament lymphadenopathy. No retroperitoneal or mesenteric lymphadenopathy. No pelvic sidewall lymphadenopathy. Reproductive: Unremarkable. Other: No intraperitoneal free fluid. Musculoskeletal: No worrisome lytic or sclerotic osseous abnormality. IMPRESSION: 1. Interval progression of consolidative masslike opacity in the suprahilar right upper lobe, possibly related to effects of radiation therapy although disease progression cannot be excluded. 2. Progressive interlobular septal thickening and multiple tiny ill-defined peripheral nodular opacities in the posterior right upper middle and lower lobes. Imaging features are concerning for progression of neoplasm/lymphangitic spread. 3. Interval decrease in size of the right hilar and mediastinal lymph nodes. 4. No evidence for metastatic disease in the abdomen or pelvis. 5. Bilateral renal cysts. 6. Wall thickening in the gastric cardia may be related to underdistention. Gastritis not excluded. Attention on follow-up recommended. Upper GI series may prove helpful to further evaluate in the short-term as clinically warranted. 7. Aortic Atherosclerosis (ICD10-I70.0). Electronically Signed   By: Misty Stanley M.D.   On: 06/06/2021 10:33   CT Abdomen Pelvis W Contrast  Result Date: 06/06/2021 CLINICAL DATA:  Primary Cancer Type: Lung Imaging Indication: Assess response to therapy Interval therapy since last imaging? Yes Initial Cancer Diagnosis Date: 03/01/2021; Established by: Presumed Detailed Pathology: Stage IV non-small cell lung carcinoma, presumed adenocarcinoma. Primary Tumor  location: Right upper lobe. Bilateral adrenal metastases. Surgeries: No. Chemotherapy: Yes; Ongoing? Yes; Most recent administration: 05/18/2021 Immunotherapy?  Yes; Type: Keytruda; Ongoing? Yes Radiation therapy? No Other Cancer Therapies: Arimidex for breast cancer. EXAM: CT CHEST, ABDOMEN, AND PELVIS WITH CONTRAST TECHNIQUE: Multidetector CT imaging of the chest, abdomen and pelvis was performed following the standard protocol during bolus administration of intravenous contrast. CONTRAST:  26mL OMNIPAQUE IOHEXOL 350 MG/ML SOLN COMPARISON:  Most recent CT chest 01/22/2021.  02/20/2021 PET-CT. FINDINGS: CT CHEST FINDINGS Cardiovascular: Heart size upper normal. Aortic valve calcification noted. Mild atherosclerotic calcification is noted in the wall of the thoracic aorta. Right Port-A-Cath tip is positioned in the distal SVC near the junction with the RA. Mediastinum/Nodes: Scattered small mediastinal lymph nodes again noted. 1.0 cm short axis subcarinal node has decreased from 1.4 cm previously (remeasured). The esophagus has normal imaging features. 7 mm right thyroid nodule again noted. Not clinically significant; no follow-up imaging recommended (ref: J Am Coll Radiol. 2015 Feb;12(2): 143-50). 8 mm short axis right hilar lymph node has decreased from 11 mm short axis previously. There is no axillary lymphadenopathy. Lungs/Pleura: Interval progression of consolidative masslike opacity in the suprahilar right upper lobe measuring 9.0 x 4.0 cm today compared to 5.6 x  3.1 cm when remeasured in a similar fashion on the prior study. This is associated with progressive interlobular septal thickening in both lung apices, right greater than left with multiple tiny ill-defined nodular opacities in the posterior right upper lobe, new in the interval. Numerous tiny parenchymal nodules are seen scattered in the inferior right lower lobe new since prior, peripheral right middle lobe, new since prior and right lower lobe,  also progressive since the prior exam. Index right lower lobe nodule measures 9 mm in the medial right lower lobe on 114/6. Similar but less advanced changes are noted in the left lower lobe. No substantial pleural effusion. Musculoskeletal: No worrisome lytic or sclerotic osseous abnormality. CT ABDOMEN PELVIS FINDINGS Hepatobiliary: No suspicious focal abnormality within the liver parenchyma. Small area of low attenuation in the anterior liver, adjacent to the falciform ligament, is in a characteristic location for focal fatty deposition. Layering sludge and/or noncalcified stones noted in the gallbladder. No intrahepatic or extrahepatic biliary dilation. Pancreas: No focal mass lesion. No dilatation of the main duct. No intraparenchymal cyst. No peripancreatic edema. Spleen: No splenomegaly. No focal mass lesion. Adrenals/Urinary Tract: No adrenal nodule or mass. Bilateral renal cysts noted measuring up to 6.1 cm in the upper pole right kidney. No evidence for hydroureter. The urinary bladder appears normal for the degree of distention. Stomach/Bowel: Wall thickening in the gastric cardia may be related to underdistention. Duodenum is normally positioned as is the ligament of Treitz. Duodenal diverticulum noted. No small bowel wall thickening. No small bowel dilatation. The terminal ileum is normal. The appendix is normal. No gross colonic mass. No colonic wall thickening. Diverticular changes are noted in the left colon without evidence of diverticulitis. Vascular/Lymphatic: There is moderate atherosclerotic calcification of the abdominal aorta without aneurysm. There is no gastrohepatic or hepatoduodenal ligament lymphadenopathy. No retroperitoneal or mesenteric lymphadenopathy. No pelvic sidewall lymphadenopathy. Reproductive: Unremarkable. Other: No intraperitoneal free fluid. Musculoskeletal: No worrisome lytic or sclerotic osseous abnormality. IMPRESSION: 1. Interval progression of consolidative masslike  opacity in the suprahilar right upper lobe, possibly related to effects of radiation therapy although disease progression cannot be excluded. 2. Progressive interlobular septal thickening and multiple tiny ill-defined peripheral nodular opacities in the posterior right upper middle and lower lobes. Imaging features are concerning for progression of neoplasm/lymphangitic spread. 3. Interval decrease in size of the right hilar and mediastinal lymph nodes. 4. No evidence for metastatic disease in the abdomen or pelvis. 5. Bilateral renal cysts. 6. Wall thickening in the gastric cardia may be related to underdistention. Gastritis not excluded. Attention on follow-up recommended. Upper GI series may prove helpful to further evaluate in the short-term as clinically warranted. 7. Aortic Atherosclerosis (ICD10-I70.0). Electronically Signed   By: Misty Stanley M.D.   On: 06/06/2021 10:33    ASSESSMENT AND PLAN: This is a very pleasant 87 years old white female with a stage IV non-small cell lung cancer, adenocarcinoma diagnosed in April 2022 and presented with large right lower lobe lung mass in addition to right hilar and mediastinal lymphadenopathy with bilateral adrenal metastasis.  The patient was also diagnosed with a stage I breast cancer involving the left breast in May 2022. She is currently undergoing treatment with systemic chemotherapy with carboplatin for AUC of 5, Alimta 500 Mg/M2 and Keytruda 200 Mg IV every 3 weeks status post 2 cycles.   The patient tolerated the second cycle of her treatment much better but continues to have few episodes of nausea and diarrhea. She had repeat CT scan  of the chest, abdomen pelvis performed recently.  I personally and independently reviewed the scan images and discussed the results with the patient and her family. Unfortunately her scan showed evidence for disease progression with increased consolidation and the suprahilar right upper lobe with progressive interlobular  septal thickening and multiple tiny ill-defined peripheral nodular opacities in the posterior right upper, middle and lower lobes suspicious for progression of neoplasm/lymphangitic spread.  There was also interval decrease in the size of the right hilar and mediastinal lymph nodes and no evidence of metastatic disease in the abdomen or pelvis. I had a lengthy discussion with the patient and her family today about her condition and treatment options.  I discussed with the patient several options for management of her condition including continuation with further treatment with the same regimen for 2 more cycles and reassessment again versus consideration of second line treatment with docetaxel and Cyramza versus palliative care and hospice referral. After discussion of the 3 options the patient and her daughter would like to proceed with palliative care and hospice. We will see her on as-needed basis at this point. The patient was advised to call if she has any concerning symptoms in the interval. The patient voices understanding of current disease status and treatment options and is in agreement with the current care plan.  All questions were answered. The patient knows to call the clinic with any problems, questions or concerns. We can certainly see the patient much sooner if necessary. The total time spent in the appointment was 35 minutes.  Disclaimer: This note was dictated with voice recognition software. Similar sounding words can inadvertently be transcribed and may not be corrected upon review.

## 2021-06-14 ENCOUNTER — Other Ambulatory Visit: Payer: Medicare PPO

## 2021-06-21 ENCOUNTER — Other Ambulatory Visit: Payer: Medicare PPO

## 2021-06-27 ENCOUNTER — Ambulatory Visit: Payer: Medicare PPO

## 2021-06-27 ENCOUNTER — Other Ambulatory Visit: Payer: Medicare PPO

## 2021-06-27 ENCOUNTER — Ambulatory Visit: Payer: Medicare PPO | Admitting: Internal Medicine

## 2021-06-30 ENCOUNTER — Other Ambulatory Visit: Payer: Self-pay | Admitting: Physician Assistant

## 2021-07-07 IMAGING — MG MM BREAST LOCALIZATION CLIP
4 series · 4 of 12 positions shown · non-contrast
Comparison: Previous exam(s).

CLINICAL DATA: Confirmation of clip placement after
ultrasound-guided core needle biopsy of a mass in the UPPER LEFT
breast at the near 12 o'clock location.

EXAM:
2D AND 3D DIAGNOSTIC LEFT MAMMOGRAM POST ULTRASOUND BIOPSY

[L ML synth-2D]
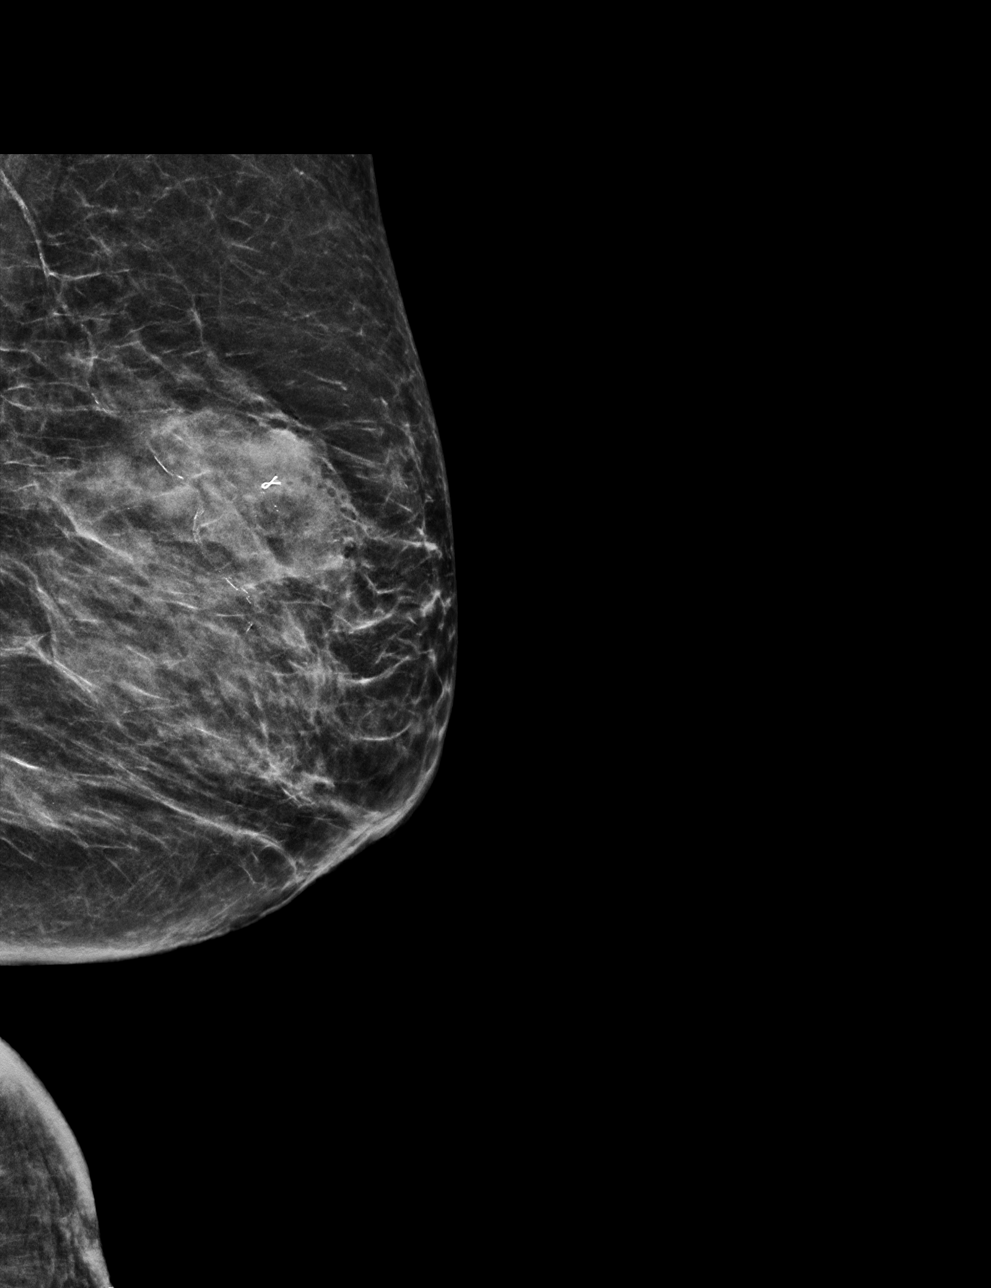

[L CC synth-2D]
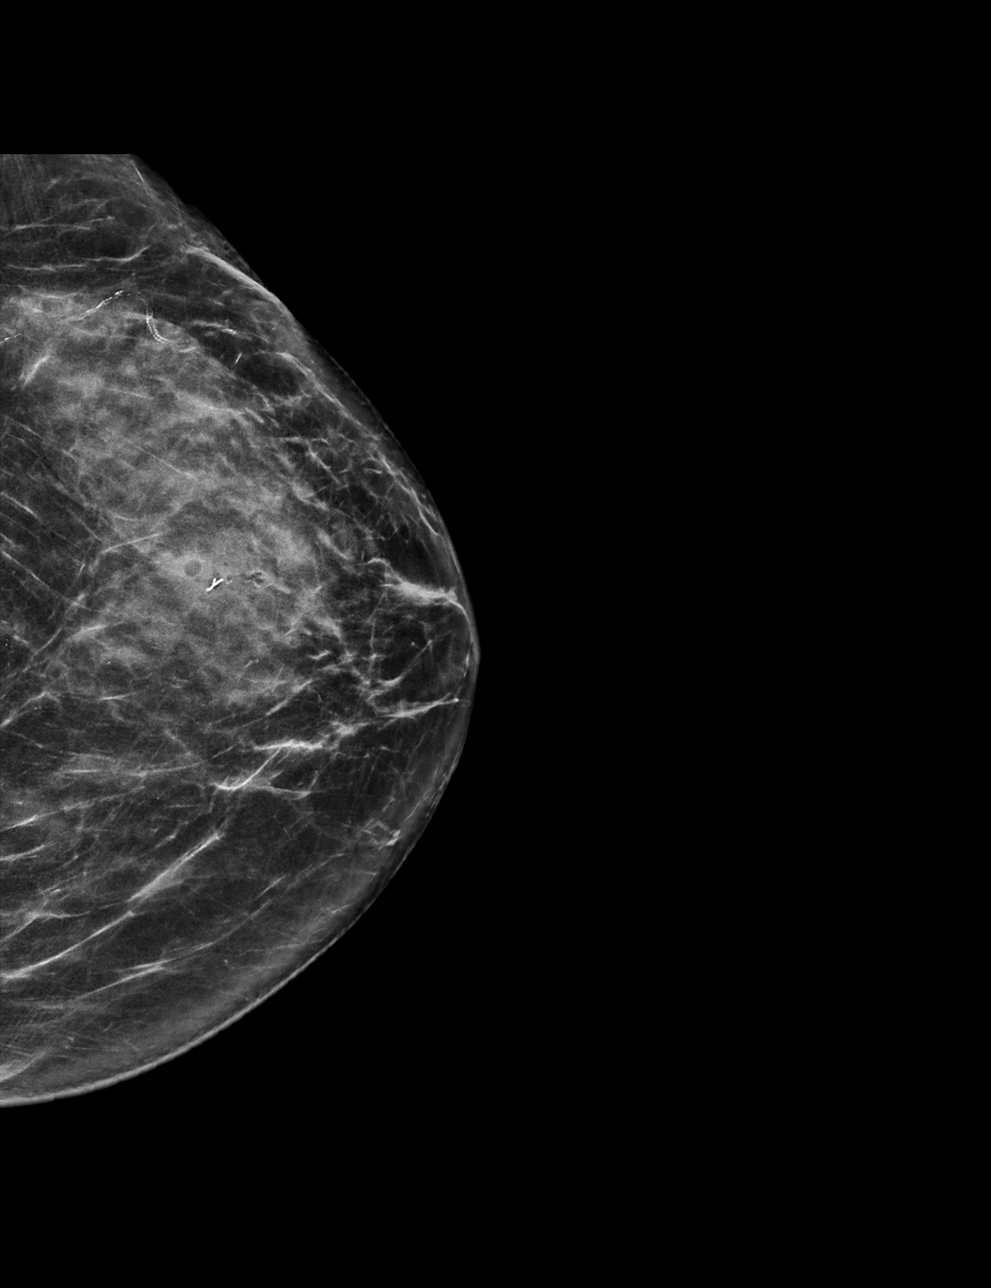

[L CC tomo · tomo slice 29/58.0]
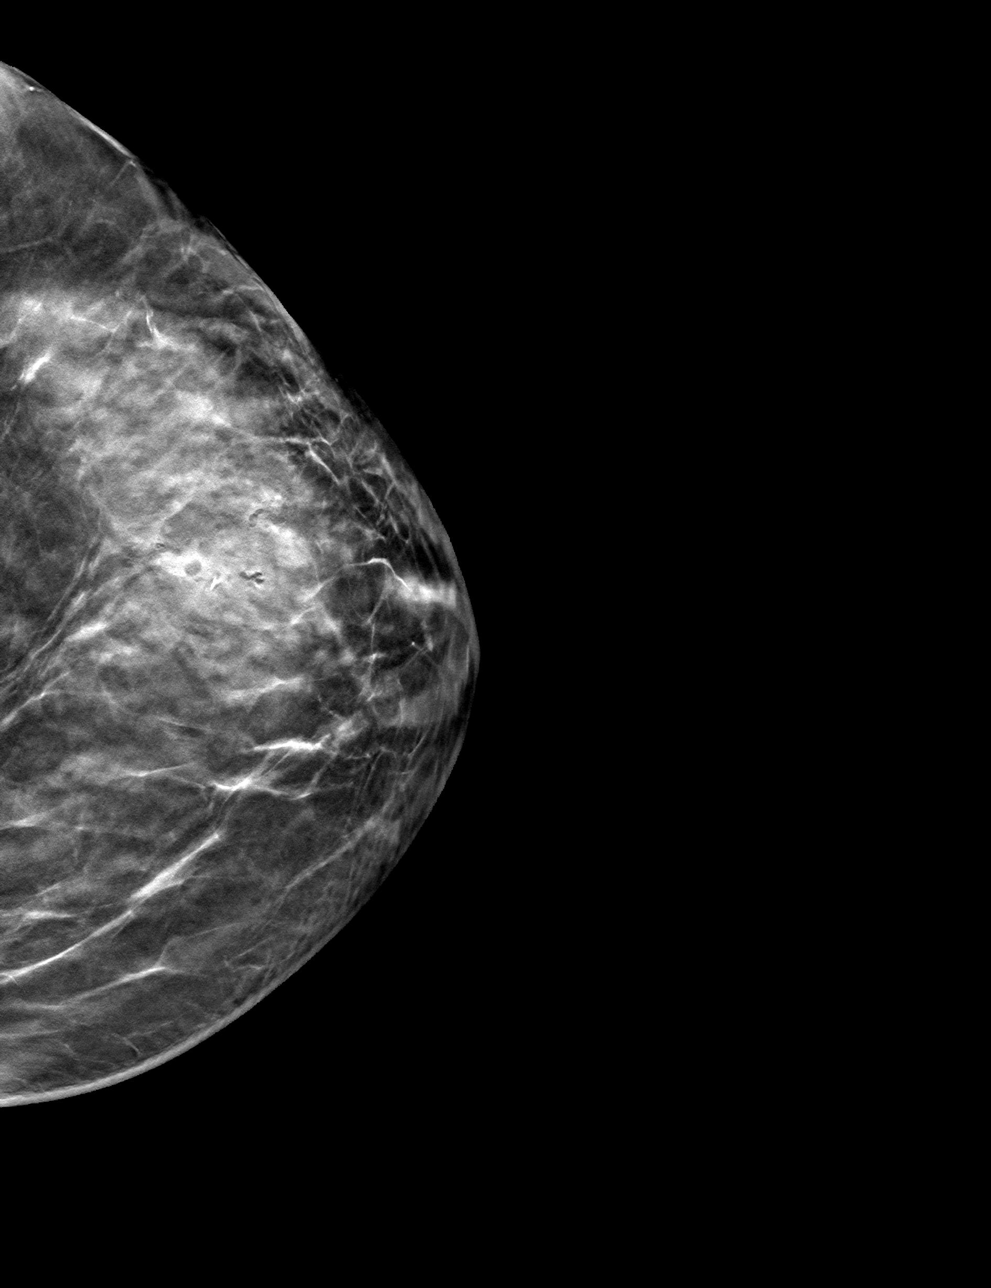

[L ML tomo · tomo slice 29/57.0]
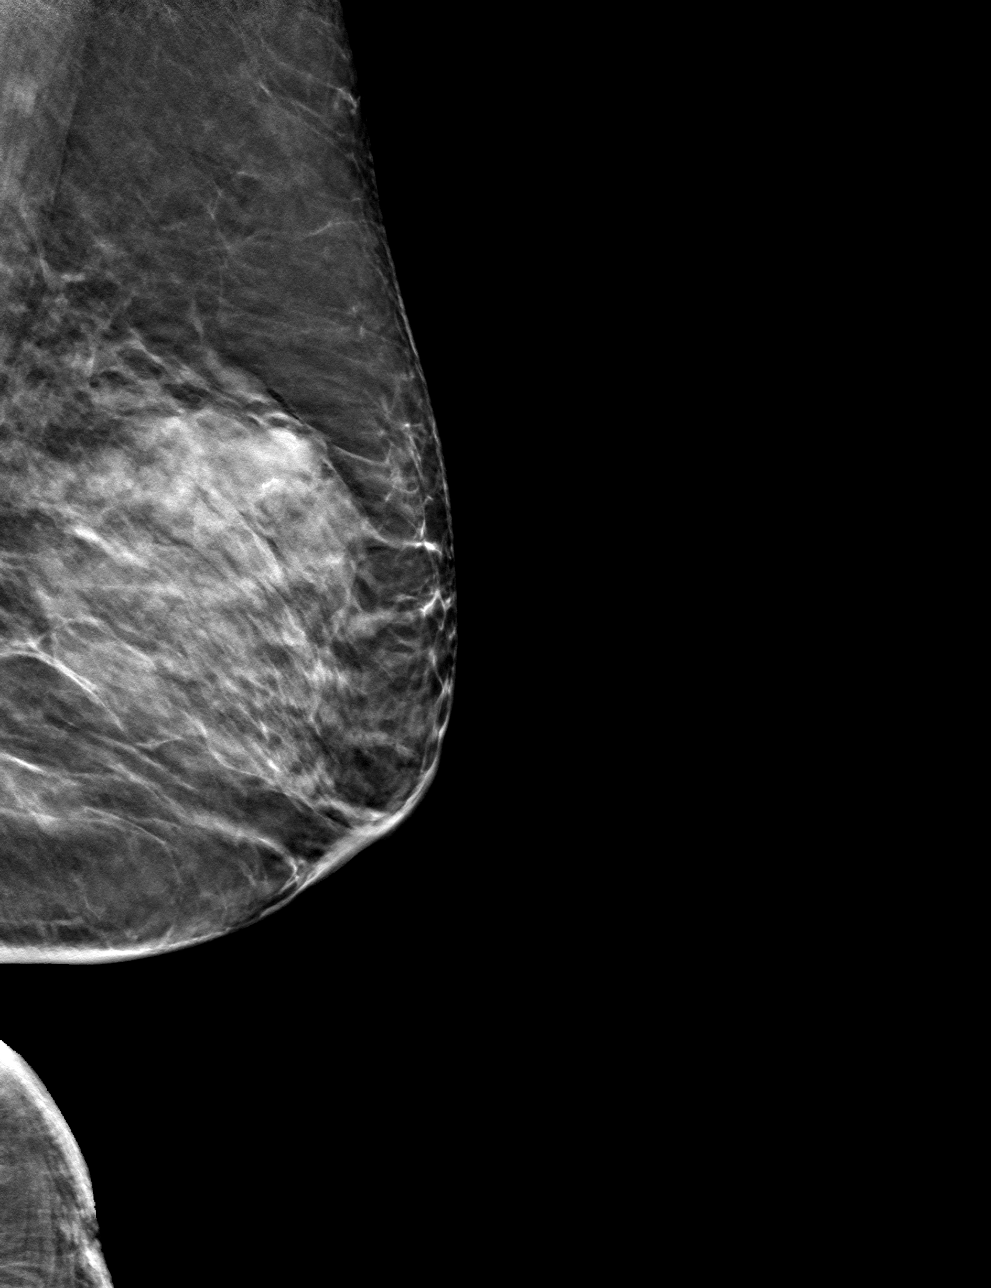

[4 of 12 positions shown; findings below may reference images not displayed]

FINDINGS: Tomosynthesis and synthesized full field CC and mediolateral
mammographic images were obtained following ultrasound guided biopsy
of a mass in the upper LEFT breast. The ribbon shaped biopsy marking
clip is appropriately position within the biopsied mass.

Expected post biopsy changes are present without evidence of
hematoma.
IMPRESSION: Appropriate positioning of the ribbon shaped biopsy marking clip
within the biopsied mass in the upper LEFT breast at the near 12
o'clock location.

Final Assessment: Post Procedure Mammograms for Marker Placement

## 2021-07-26 ENCOUNTER — Encounter: Payer: Self-pay | Admitting: *Deleted

## 2021-07-27 ENCOUNTER — Encounter: Payer: Self-pay | Admitting: Physician Assistant

## 2021-10-15 ENCOUNTER — Telehealth: Payer: Self-pay

## 2021-10-15 NOTE — Telephone Encounter (Signed)
Prescott Gum, RN with [facility] called to advise pt passed 11/05/2021 at 2:45pm

## 2021-10-18 DEATH — deceased

## 2021-11-15 ENCOUNTER — Encounter (INDEPENDENT_AMBULATORY_CARE_PROVIDER_SITE_OTHER): Payer: Medicare PPO | Admitting: Ophthalmology
